# Patient Record
Sex: Male | Born: 1960
Health system: Southern US, Community
[De-identification: ages and names within clinical notes are randomized; demographics above are authoritative.]

## PROBLEM LIST (undated history)

## (undated) DIAGNOSIS — M1990 Unspecified osteoarthritis, unspecified site: Secondary | ICD-10-CM

## (undated) DIAGNOSIS — K219 Gastro-esophageal reflux disease without esophagitis: Secondary | ICD-10-CM

## (undated) DIAGNOSIS — I10 Essential (primary) hypertension: Secondary | ICD-10-CM

## (undated) DIAGNOSIS — Z973 Presence of spectacles and contact lenses: Secondary | ICD-10-CM

## (undated) DIAGNOSIS — Z860100 Personal history of colon polyps, unspecified: Secondary | ICD-10-CM

## (undated) DIAGNOSIS — Z87442 Personal history of urinary calculi: Secondary | ICD-10-CM

## (undated) DIAGNOSIS — M2390 Unspecified internal derangement of unspecified knee: Secondary | ICD-10-CM

## (undated) DIAGNOSIS — E559 Vitamin D deficiency, unspecified: Secondary | ICD-10-CM

## (undated) DIAGNOSIS — E785 Hyperlipidemia, unspecified: Secondary | ICD-10-CM

## (undated) DIAGNOSIS — K4090 Unilateral inguinal hernia, without obstruction or gangrene, not specified as recurrent: Secondary | ICD-10-CM

## (undated) DIAGNOSIS — N400 Enlarged prostate without lower urinary tract symptoms: Secondary | ICD-10-CM

## (undated) DIAGNOSIS — J45909 Unspecified asthma, uncomplicated: Secondary | ICD-10-CM

## (undated) DIAGNOSIS — Z8719 Personal history of other diseases of the digestive system: Secondary | ICD-10-CM

## (undated) HISTORY — PX: EYE SURGERY: SHX253

## (undated) HISTORY — DX: Vitamin D deficiency, unspecified: E55.9

## (undated) HISTORY — PX: OTHER SURGICAL HISTORY: SHX169

## (undated) HISTORY — PX: KNEE ARTHROSCOPY: SUR90

---

## 1987-02-04 HISTORY — PX: PARATHYROIDECTOMY: SHX19

## 1998-05-03 ENCOUNTER — Other Ambulatory Visit: Admission: RE | Admit: 1998-05-03 | Discharge: 1998-05-03 | Payer: Self-pay | Admitting: *Deleted

## 2001-12-17 ENCOUNTER — Encounter (INDEPENDENT_AMBULATORY_CARE_PROVIDER_SITE_OTHER): Payer: Self-pay | Admitting: Specialist

## 2001-12-17 ENCOUNTER — Ambulatory Visit (HOSPITAL_COMMUNITY): Admission: RE | Admit: 2001-12-17 | Discharge: 2001-12-17 | Payer: Self-pay | Admitting: Gastroenterology

## 2003-07-04 ENCOUNTER — Ambulatory Visit (HOSPITAL_COMMUNITY): Admission: RE | Admit: 2003-07-04 | Discharge: 2003-07-04 | Payer: Self-pay | Admitting: Pulmonary Disease

## 2006-01-29 ENCOUNTER — Ambulatory Visit: Payer: Self-pay | Admitting: Pulmonary Disease

## 2006-02-12 ENCOUNTER — Ambulatory Visit: Payer: Self-pay | Admitting: Pulmonary Disease

## 2006-02-12 LAB — CONVERTED CEMR LAB: Uric Acid, Serum: 7.5 mg/dL — ABNORMAL HIGH (ref 2.4–7.0)

## 2006-04-22 ENCOUNTER — Ambulatory Visit: Payer: Self-pay | Admitting: Pulmonary Disease

## 2006-08-06 ENCOUNTER — Ambulatory Visit (HOSPITAL_COMMUNITY): Admission: RE | Admit: 2006-08-06 | Discharge: 2006-08-06 | Payer: Self-pay | Admitting: Pulmonary Disease

## 2006-11-05 ENCOUNTER — Encounter: Admission: RE | Admit: 2006-11-05 | Discharge: 2006-11-05 | Payer: Self-pay | Admitting: Otolaryngology

## 2006-12-09 ENCOUNTER — Emergency Department (HOSPITAL_COMMUNITY): Admission: EM | Admit: 2006-12-09 | Discharge: 2006-12-09 | Payer: Self-pay | Admitting: Emergency Medicine

## 2007-01-05 ENCOUNTER — Ambulatory Visit: Payer: Self-pay | Admitting: Pulmonary Disease

## 2007-01-05 LAB — CONVERTED CEMR LAB
CRP, High Sensitivity: 3 (ref 0.00–5.00)
Cyclic Citrullin Peptide Ab: 1.5 U
IgA: 462 mg/dL — ABNORMAL HIGH
IgG (Immunoglobin G), Serum: 1080 mg/dL
IgM, Serum: 97 mg/dL
Rhuematoid fact SerPl-aCnc: <20 intl units/mL — ABNORMAL LOW (ref 0.0–20.0)
Total Protein, Serum Electrophoresis: 8.2 g/dL
Uric Acid, Serum: 6 mg/dL (ref 2.4–7.0)

## 2007-01-26 ENCOUNTER — Encounter: Payer: Self-pay | Admitting: Pulmonary Disease

## 2007-03-16 DIAGNOSIS — E7800 Pure hypercholesterolemia, unspecified: Secondary | ICD-10-CM

## 2007-03-16 DIAGNOSIS — R519 Headache, unspecified: Secondary | ICD-10-CM | POA: Insufficient documentation

## 2007-03-16 DIAGNOSIS — I10 Essential (primary) hypertension: Secondary | ICD-10-CM | POA: Insufficient documentation

## 2007-03-16 DIAGNOSIS — R51 Headache: Secondary | ICD-10-CM

## 2007-03-16 LAB — CONVERTED CEMR LAB: Metaneph Total, Ur: 673 ug/24hr

## 2007-04-02 ENCOUNTER — Observation Stay (HOSPITAL_COMMUNITY): Admission: RE | Admit: 2007-04-02 | Discharge: 2007-04-02 | Payer: Self-pay | Admitting: *Deleted

## 2007-04-02 HISTORY — PX: LUMBAR DISC SURGERY: SHX700

## 2007-06-09 ENCOUNTER — Ambulatory Visit: Payer: Self-pay | Admitting: Pulmonary Disease

## 2007-07-01 LAB — CONVERTED CEMR LAB
ALT: 45 units/L (ref 0–53)
AST: 30 units/L (ref 0–37)
Albumin: 4 g/dL (ref 3.5–5.2)
Alkaline Phosphatase: 57 units/L (ref 39–117)
Basophils Absolute: 0.1 10*3/uL (ref 0.0–0.1)
Bilirubin, Direct: 0.1 mg/dL (ref 0.0–0.3)
CO2: 28 meq/L (ref 19–32)
Calcium: 9.4 mg/dL (ref 8.4–10.5)
Chloride: 104 meq/L (ref 96–112)
Cholesterol: 211 mg/dL (ref 0–200)
GFR calc Af Amer: 103 mL/min
GFR calc non Af Amer: 85 mL/min
Glucose, Bld: 103 mg/dL — ABNORMAL HIGH (ref 70–99)
HCT: 42.4 % (ref 39.0–52.0)
Lymphocytes Relative: 40.2 % (ref 12.0–46.0)
MCHC: 33.7 g/dL (ref 30.0–36.0)
MCV: 88 fL (ref 78.0–100.0)
Monocytes Relative: 9.5 % (ref 3.0–12.0)
Platelets: 264 10*3/uL (ref 150–400)
TSH: 1.71 microintl units/mL (ref 0.35–5.50)
Triglycerides: 253 mg/dL (ref 0–149)
VLDL: 51 mg/dL — ABNORMAL HIGH (ref 0–40)

## 2007-09-13 ENCOUNTER — Ambulatory Visit: Payer: Self-pay | Admitting: Pulmonary Disease

## 2007-11-30 ENCOUNTER — Encounter: Payer: Self-pay | Admitting: Pulmonary Disease

## 2008-01-21 ENCOUNTER — Ambulatory Visit (HOSPITAL_COMMUNITY): Admission: RE | Admit: 2008-01-21 | Discharge: 2008-01-21 | Payer: Self-pay | Admitting: Gastroenterology

## 2008-01-21 ENCOUNTER — Encounter: Payer: Self-pay | Admitting: Pulmonary Disease

## 2008-01-21 HISTORY — PX: COLONOSCOPY W/ POLYPECTOMY: SHX1380

## 2008-04-04 ENCOUNTER — Encounter: Payer: Self-pay | Admitting: Pulmonary Disease

## 2008-04-10 ENCOUNTER — Encounter: Payer: Self-pay | Admitting: Pulmonary Disease

## 2008-04-27 ENCOUNTER — Encounter: Payer: Self-pay | Admitting: Pulmonary Disease

## 2008-11-14 ENCOUNTER — Encounter (INDEPENDENT_AMBULATORY_CARE_PROVIDER_SITE_OTHER): Payer: Self-pay | Admitting: Gastroenterology

## 2008-11-14 ENCOUNTER — Ambulatory Visit (HOSPITAL_COMMUNITY): Admission: RE | Admit: 2008-11-14 | Discharge: 2008-11-14 | Payer: Self-pay | Admitting: Gastroenterology

## 2008-11-14 ENCOUNTER — Encounter: Payer: Self-pay | Admitting: Pulmonary Disease

## 2008-11-15 HISTORY — PX: ESOPHAGOGASTRODUODENOSCOPY: SHX1529

## 2008-11-21 ENCOUNTER — Ambulatory Visit (HOSPITAL_COMMUNITY): Admission: RE | Admit: 2008-11-21 | Discharge: 2008-11-21 | Payer: Self-pay | Admitting: Gastroenterology

## 2009-10-04 ENCOUNTER — Encounter: Payer: Self-pay | Admitting: Pulmonary Disease

## 2010-05-14 ENCOUNTER — Encounter: Payer: Self-pay | Admitting: Pulmonary Disease

## 2010-06-18 NOTE — Consult Note (Signed)
NAMEANTHEM, FRAZER                ACCOUNT NO.:  [id number]   MEDICAL RECORD NO.:  [id number]          PATIENT TYPE:  EMS   LOCATION:  MAJO                         FACILITY:  [facility]   PHYSICIAN:  Pramod P. Pearlean Brownie, MD    DATE OF BIRTH:  May 19, 1960   DATE OF CONSULTATION:  DATE OF DISCHARGE:                                 CONSULTATION   REFERRING PHYSICIAN:  Carleene Cooper, MD.   REASON FOR REFERRAL:  Headache.   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 this morning with sudden onset of worse  headache of his life.  He stated the headache was maximum in the onset,  10/10 in severity, located in occipital region, accompanied by some  nausea and mild light sensitivity.  He initially thought that it would  go away and wanted to let it pass; however, his wife convinced him to  come to the emergency room.  He feels the headache has eased off a  little bit, but it is still moderate 4/10 in severity.  He denies any  loss of vision, double vision, vertigo, focal extremity weakness,  numbness, gait or balance problems.  The headache has persisted since he  woke up with it.  Patient denies significant posterior neck pain, muscle  spasm.  There is no history of recent trauma or any medication change.  He has not had any unusual physical exertion or injury or trauma in the  last few weeks.  He has a history of similar headache approximately 10  years ago which was similar in location.  He was seen by Dr. Avie Echevaria  at that time, and he underwent an MRI scan, MRA of the brain which were  both unremarkable.  He has no history of migraine headaches or tension  headaches.   PAST MEDICAL HISTORY:  Significant for hypertension, hyperlipidemia.   MEDICATIONS LIST:  Toprol XL 25 mg a day, Micardis hydrochlorothiazide  80/12.5 once a day, Lipitor 20 mg a day.   SOCIAL HISTORY:  Patient is married, lives with his wife in [place].  He is an  active physician on staff at [employer]   REVIEW OF SYSTEMS:  As stated above in H&P.   PHYSICAL EXAMINATION:  GENERAL:  Reveals a pleasant young Caucasian  male, not in distress.  VITAL SIGNS:  Afebrile; pulse rate 70 per minute, regular; respiratory  rate 16 per minute.  Blood pressure on admission was 180/102, now it has  come down to 138/92; pulse rate of 70 per minute, regular; respiratory  rate 18 per minute.  HEENT: __________, head is nontraumatic.  There is no neck stiffness or  muscle spasm.  There is no carotid bruit.  ENT exam unremarkable.  NEUROLOGIC EXAMINATION:  Patient is oriented, awake, alert, cooperative  with no aphasia or peripheral dysarthria and movements are full range  without nystagmus.  Funduscopic exam reveals sharp disk margins.  There  are no subhyaloid hemorrhages seen.  There is no papilledema.  Visual  acuity and fields are adequate.  Face  is symmetric.  Palatal movements  are normal.  Tongue is midline.  MOTOR SYSTEM:  Exam reveals no upper extremity drift; symmetric  strength, tone, reflexes coordination and sensation.  His gait is steady  including tandem walking.   DATA REVIEWED:  Noncontrast CAT scan of the head done today reveals no  acute abnormality.  There no subarachnoid hemorrhage noted.   MRI scan of the brain was subsequently obtained which also shows no  abnormalities.   The FLAIR images are negative for subarachnoid blood.   LABORATORY:  BMP is unremarkable.   IMPRESSION:  A 50 year old male with sudden onset of worst occipital  headache which has eased off but persists.  Certainly, there is strong  suspicion for subarachnoid hemorrhage and a small leaking aneurysm,  particularly given his family history of mother with aneurysms.   PLAN:  I would recommend further evaluation with doing a spinal tap  under fluoroscopy to look for xanthochromia or red cells.  If this is  positive, patient may need cerebral angiogram.  If  this is negative,  patient may be discharged home with nonsteroidal anti-inflammatory  medicines and follow up electively with Dr. Sandria Manly in the future.  I had a  long discussion with the patient and his wife and answered questions and  will be happy to follow him and give further advise as needed.           ______________________________  Sunny Schlein. Pearlean Brownie, MD     PPS/MEDQ  D:  12/09/2006  T:  12/10/2006  Job:  [id number]

## 2010-06-18 NOTE — Op Note (Signed)
[name], [name]                ACCOUNT NO.:  [id number]   MEDICAL RECORD NO.:  [id number]          PATIENT TYPE:  OBV   LOCATION:  3534                         FACILITY:  [facility]   PHYSICIAN:  Stefani Dama, M.D.  DATE OF BIRTH:  1960-06-08   DATE OF PROCEDURE:  04/02/2007  DATE OF DISCHARGE:  04/02/2007                               OPERATIVE REPORT   PREOPERATIVE DIAGNOSIS:  Lumbar herniated nucleus pulposus, L5-S1,  right, with right lumbar radiculopathy.   POSTOPERATIVE DIAGNOSIS:  Lumbar herniated nucleus pulposus, L5-S1,  right, with right lumbar radiculopathy.   PROCEDURES:  METRx diskectomy, L5-S1, right, with operating microscope  and microdissection technique.   SURGEON:  Stefani Dama, M.D.   FIRST ASSISTANT:  Danae Orleans. Venetia Maxon, M.D.   ANESTHESIA:  General endotracheal.   INDICATIONS:  [name] is a 50 year old individual who has had  significant back and right lower extremity pain.  He has evidence of a  herniated nucleus pulposus and has failed efforts at conservative  management; we advised a microdiskectomy.   PROCEDURE:  The patient was brought to the operating room supine on a  stretcher.  After the smooth induction of general endotracheal  anesthesia, he was turned prone.  Back was prepped with alcohol and  DuraPrep and draped in a sterile fashion.  Fluoroscopic guidance was  used to localize the L5-S1 space and then a paramedian incision was  created and a K-wire was passed into the interlaminar space at L5-S1.  A  series of dilators were then passed over the K-wire and using a wanding  technique, the fascia overlying L5-S1 was dissected. A 6-cm-deep x 18-mm-  wide cannula was affixed to the operating table with a clamp and the  operating microscope was brought in over this field and then a  laminotomy was created, removing the inferior margin of the lamina of L5  out to the mesial wall of the facet.  Soft tissues in this area were  cleared and the  yellow ligament was taken up. Underneath the yellow  ligament, the common dural tube was identified and the laminotomy was  carried out laterally and a mesial facetectomy was performed, removing  just the inner quarter of the facet joint at L5-S1; this exposed the S1  nerve root, which was noted be tented dorsally and was rather blanched.  By gently dissecting just lateral to the S1 nerve root, a mass was  uncovered; this was noted be a pearlescent mass of the posterior  longitudinal ligament which was under considerable tension.  The mass  was incised in a vertical fashion and a fragment of degenerated disk  extruded itself.  This was removed and several other small fragments  were found in this region.  The area was then palpated with a nerve hook  and an opening into the disk space could not be identified.  The disk  space itself appeared to have the ligament intact and because of this,  it was felt that an opening into the disk space would not be  appropriate.  Hemostasis in the soft tissues was done  obtained and the  area was inspected again.  When no fragments of disk were identified,  the endoscopic cannula was then removed after instilling 0.5 mL of  fentanyl in the epidural space.  The  lumbodorsal fascia was then closed with #1 Vicryl in interrupted  fashion.  Ten milliliters of 0.5% Marcaine were injected into the  epidural space and the subcutaneous tissue was closed with 3-0 Vicryl in  an inverted, interrupted fashion.  The patient tolerated the procedure  well.  Blood loss was estimated at less than 20 mL.      Stefani Dama, M.D.  Electronically Signed     HJE/MEDQ  D:  04/02/2007  T:  04/03/2007  Job:  [id number]

## 2010-06-18 NOTE — Op Note (Signed)
[name], [name]                ACCOUNT NO.:  [id number]   MEDICAL RECORD NO.:  [id number]          PATIENT TYPE:  AMB   LOCATION:  ENDO                         FACILITY:  [facility]   PHYSICIAN:  Petra Kuba, M.D.    DATE OF BIRTH:  1960-09-19   DATE OF PROCEDURE:  01/21/2008  DATE OF DISCHARGE:                               OPERATIVE REPORT   PROCEDURE:  Colonoscopy.   INDICATION:  Patient with known history of colon polyps, family history  of colon cancer, due for colonic screening.  Consent was signed after  risks, benefits, methods, options thoroughly discussed multiple times in  the past.   MEDICINES USED:  1. Fentanyl 75 mcg.  2. Versed 8 mg.   PROCEDURE:  Rectal inspection is pertinent for small internal  hemorrhoids.  Digital exam was negative.  The video pediatric  colonoscope was inserted, easily advanced around the colon to the cecum.  Other than some rare left-sided small diverticula, no abnormalities were  seen on insertion.  The cecum was identified by the appendiceal orifice  and the ileocecal valve.  In fact the scope inserted a short ways in the  terminal ileum, which was normal.  Photo documentation was obtained.  The scope was slowly withdrawn.  The prep was adequate.  There was  minimal liquid stool that required washing and suctioning on slow  withdrawal through the colon.  The cecum, ascending, tranverse, and  descending were all normal.  The rare sigmoid diverticula was confirmed.  No polypoid lesions, masses, or other abnormalities were seen as we  slowly withdrew back to the rectum.  Once back in the rectum, anorectal  pull-through and retroflexion confirmed some small hemorrhoids.  The  scope was straightened, readvanced a short ways up the left side of the  colon.  Air was suctioned, scope removed.   The patient tolerated the procedure well.  There was no obvious  immediate complication.   ENDOSCOPIC DIAGNOSES:  1. Internal and external small  hemorrhoids.  2. Rare left-sided diverticula.  3. Otherwise within normal limits to the terminal ileum.   PLAN:  I would be happy to see him back p.r.n. to let me know if reflux  symptoms increase, and otherwise recheck colon screen in 5 years.           ______________________________  Petra Kuba, M.D.     MEM/MEDQ  D:  01/21/2008  T:  01/21/2008  Job:  [id number]   cc:   Lonzo Cloud. Kriste Basque, MD  [address]  [place]  [place] [address]

## 2010-06-21 NOTE — Op Note (Signed)
NAME:  [name], [name]                          ACCOUNT NO.:  [id number]   MEDICAL RECORD NO.:  [id number]                   PATIENT TYPE:  AMB   LOCATION:  ENDO                                 FACILITY:  [facility]   PHYSICIAN:  Petra Kuba, M.D.                 DATE OF BIRTH:  [birth year]   DATE OF PROCEDURE:  12/17/2001  DATE OF DISCHARGE:                                 OPERATIVE REPORT   PROCEDURE:  Colonoscopy with polypectomy.   INDICATIONS FOR PROCEDURE:  A patient with a history of colon polyps here  for repeat screening.  Consent was signed after risks, benefits, methods,  and options were thoroughly discussed in the past.   MEDICINES USED:  Demerol 70, Versed 7.5.   DESCRIPTION OF PROCEDURE:  Rectal inspection was pertinent for tiny external  hemorrhoids. Digital exam was negative. The pediatric video adjustable  colonoscope was inserted, easily advanced around the colon to the cecum.  This did not require any abdominal pressure or any position changes. No  obvious abnormality was seen on insertion. The cecum was identified by the  appendiceal orifice and the ileocecal valve. In fact, the scope was inserted  a very short ways into the terminal ileum which was normal. Photo  documentation was obtained. The scope was slowly withdrawn. The prep was  adequate. There was some liquid stool that required suctioning only on slow  withdrawal through the colon. The cecum and ascending were normal. In the  mid transverse, a tiny questionable polyp was seen and was hot biopsied x1.  The scope was further withdrawn. On slow withdrawal back to the rectum, no  additional polyps, masses, diverticula or other abnormalities were seen as  we slowly withdrew back to the rectum. Anorectal pull-through and  retroflexed visualization of the rectum confirmed some tiny hemorrhoids and  confirmed a small anal papillae without worrisome stigmata.  The scope was  straightened and readvanced to the mid  transverse and polypectomy site was  seen  with a nice white coagulum without any obvious residual polypoid  tissue. Air was suctioned, scope was slowly withdrawn. The patient tolerated  the procedure well and there was no obvious or immediate complications.   ENDOSCOPIC DIAGNOSIS:  1. Internal and external hemorrhoids with a small anal papillae.  2. Tiny transverse questionable polyp hot biopsied.  3. Otherwise within normal limits to the cecum and the terminal ileum.    PLAN:  Await pathology but probably recheck colon screening in five years.  Happy to see back p.r.n.; otherwise, return care to Dr. Evlyn Kanner for the  customary health care maintenance to include yearly rectals and guaiacs.  Petra Kuba, M.D.    MEM/MEDQ  D:  12/17/2001  T:  12/17/2001  Job:  [id number]   cc:   Marcelyn Bruins, M.D. [facility]   Jeannett Senior A. Evlyn Kanner, M.D.  [address]  [place]  [place] [address]  Fax: [phone]

## 2010-06-21 NOTE — Letter (Signed)
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  He had a routine physical examination last  year and is under my care for the treatment of hypertension and  hyperlipidemia.   As part of his initial history, Kipper indicated to me that he had a  severe reaction to a flu shot in 1986, with swelling, redness and  temperature to 102 degrees with chills.  This was followed by a 5-day  illness and eventual recovery.  Of note, a coworker who received the  same vaccine came down with Guillain-Barre' syndrome.  Ever since that  time, Darcell has declined flu vaccines because of his previous allergic  reaction.   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  Because of the severity of his previous reaction, I feel that it is best  to exempt him from the flu shot requirement.  We discussed the  importance of protecting himself, as well as patients and coworkers in  the event of an influenza like illness, and he has been advised to stay  home for 1-week should he develop an influenza like illness  with fever, etc.  He has assured me that he would not put any of the  patients or medical staff at risk.   Thank you very much for your assistance in this matter, if I can be of  further help please let me know.    Sincerely,      Lonzo Cloud. Kriste Basque, MD  Electronically Signed    SMN/MedQ  DD: 11/30/2007  DT: 11/30/2007  Job #: [id number]

## 2010-09-29 ENCOUNTER — Inpatient Hospital Stay (INDEPENDENT_AMBULATORY_CARE_PROVIDER_SITE_OTHER)
Admission: RE | Admit: 2010-09-29 | Discharge: 2010-09-29 | Disposition: A | Discharge status: Home / Self Care | Payer: 59 | Source: Ambulatory Visit | Attending: Family Medicine | Admitting: Family Medicine

## 2010-09-29 ENCOUNTER — Ambulatory Visit (INDEPENDENT_AMBULATORY_CARE_PROVIDER_SITE_OTHER): Payer: 59

## 2010-09-29 DIAGNOSIS — S63509A Unspecified sprain of unspecified wrist, initial encounter: Secondary | ICD-10-CM

## 2010-09-29 DIAGNOSIS — S52123A Displaced fracture of head of unspecified radius, initial encounter for closed fracture: Secondary | ICD-10-CM

## 2010-10-03 ENCOUNTER — Other Ambulatory Visit: Payer: Self-pay | Admitting: Pulmonary Disease

## 2010-10-03 ENCOUNTER — Ambulatory Visit (INDEPENDENT_AMBULATORY_CARE_PROVIDER_SITE_OTHER)
Admission: RE | Admit: 2010-10-03 | Discharge: 2010-10-03 | Disposition: A | Discharge status: Home / Self Care | Payer: 59 | Source: Ambulatory Visit | Attending: Pulmonary Disease | Admitting: Pulmonary Disease

## 2010-10-03 DIAGNOSIS — R0600 Dyspnea, unspecified: Secondary | ICD-10-CM

## 2010-10-03 DIAGNOSIS — R0609 Other forms of dyspnea: Secondary | ICD-10-CM

## 2010-10-25 LAB — COMPREHENSIVE METABOLIC PANEL WITH GFR
ALT: 35
AST: 32
Albumin: 4.2
Alkaline Phosphatase: 55
BUN: 16
CO2: 31
Calcium: 9.8
Chloride: 96
Creatinine, Ser: 1.08
GFR calc non Af Amer: >60
Glucose, Bld: 113 — ABNORMAL HIGH
Potassium: 4.2
Sodium: 135
Total Bilirubin: 1.1
Total Protein: 7.2

## 2010-10-25 LAB — CBC
Hemoglobin: 15.6
MCHC: 34.6
MCV: 86.4

## 2010-11-12 LAB — PROTEIN AND GLUCOSE, CSF: Glucose, CSF: 58

## 2010-11-12 LAB — GRAM STAIN

## 2010-11-12 LAB — I-STAT 8, (EC8 V) (CONVERTED LAB)
HCT: 42
Hemoglobin: 14.3
Operator id: 285841
Potassium: 4.1
Sodium: 139
TCO2: 25

## 2010-11-12 LAB — CSF CELL COUNT WITH DIFFERENTIAL
Eosinophils, CSF: 0
Other Cells, CSF: 0
RBC Count, CSF: 4 — ABNORMAL HIGH
Tube #: 1
WBC, CSF: 1

## 2010-11-12 LAB — CSF CULTURE W GRAM STAIN

## 2010-11-12 LAB — COMPREHENSIVE METABOLIC PANEL
ALT: 33
AST: 27
Albumin: 3.9
Calcium: 9.9
Chloride: 106
Creatinine, Ser: 1.06
GFR calc Af Amer: >60
Sodium: 140

## 2010-11-12 LAB — PROTEIN ELECTROPHORESIS, SERUM
Beta 2: 6.2
Gamma Globulin: 12.3
M-Spike, %: NOT DETECTED

## 2010-11-12 LAB — HEMOGLOBIN A1C: Mean Plasma Glucose: 122

## 2010-11-12 LAB — CBC
MCV: 87.9
Platelets: 289
RBC: 4.53
WBC: 6.7

## 2010-11-12 LAB — URINALYSIS, ROUTINE W REFLEX MICROSCOPIC
Bilirubin Urine: NEGATIVE
Hgb urine dipstick: NEGATIVE

## 2010-11-12 LAB — T4, FREE: Free T4: 1.44

## 2010-11-12 LAB — TSH: TSH: 2.734

## 2010-11-12 LAB — T3, FREE: T3, Free: 4 (ref 2.3–4.2)

## 2010-11-12 LAB — DIFFERENTIAL
Eosinophils Absolute: 0.3
Eosinophils Relative: 5
Lymphocytes Relative: 31
Lymphs Abs: 2.1
Monocytes Absolute: 0.5

## 2010-11-12 LAB — POCT I-STAT CREATININE
Creatinine, Ser: 1
Operator id: 285841

## 2011-01-06 ENCOUNTER — Ambulatory Visit: Payer: 59 | Admitting: Pulmonary Disease

## 2011-01-21 ENCOUNTER — Telehealth: Payer: Self-pay | Admitting: Pulmonary Disease

## 2011-01-21 NOTE — Telephone Encounter (Signed)
Error.  [name] ° °

## 2011-05-26 ENCOUNTER — Other Ambulatory Visit: Payer: Self-pay

## 2011-05-26 MED ORDER — ESOMEPRAZOLE MAGNESIUM 40 MG PO CPDR: 40.0000 mg | DELAYED_RELEASE_CAPSULE | Freq: Every day | ORAL | Status: DC

## 2011-08-22 ENCOUNTER — Other Ambulatory Visit: Payer: Self-pay | Admitting: Pulmonary Disease

## 2011-08-22 MED ORDER — ATORVASTATIN CALCIUM 40 MG PO TABS: 40.0000 mg | ORAL_TABLET | Freq: Every day | ORAL | Status: DC

## 2012-04-05 ENCOUNTER — Other Ambulatory Visit: Payer: Self-pay | Admitting: Pulmonary Disease

## 2012-04-05 MED ORDER — PANTOPRAZOLE SODIUM 40 MG PO TBEC: 40.0000 mg | DELAYED_RELEASE_TABLET | Freq: Every day | ORAL | Status: DC

## 2012-06-02 ENCOUNTER — Other Ambulatory Visit (HOSPITAL_COMMUNITY): Payer: Self-pay | Admitting: Neurological Surgery

## 2012-06-02 DIAGNOSIS — M79604 Pain in right leg: Secondary | ICD-10-CM

## 2012-06-02 DIAGNOSIS — M7918 Myalgia, other site: Secondary | ICD-10-CM

## 2012-06-02 DIAGNOSIS — M545 Low back pain, unspecified: Secondary | ICD-10-CM

## 2012-06-03 ENCOUNTER — Ambulatory Visit (HOSPITAL_COMMUNITY): Payer: 59

## 2012-06-10 ENCOUNTER — Ambulatory Visit (HOSPITAL_COMMUNITY)
Admission: RE | Admit: 2012-06-10 | Discharge: 2012-06-10 | Disposition: A | Discharge status: Home / Self Care | Payer: 59 | Source: Ambulatory Visit | Attending: Neurological Surgery | Admitting: Neurological Surgery

## 2012-06-10 DIAGNOSIS — M5380 Other specified dorsopathies, site unspecified: Secondary | ICD-10-CM | POA: Insufficient documentation

## 2012-06-10 DIAGNOSIS — M51379 Other intervertebral disc degeneration, lumbosacral region without mention of lumbar back pain or lower extremity pain: Secondary | ICD-10-CM | POA: Insufficient documentation

## 2012-06-10 DIAGNOSIS — M5137 Other intervertebral disc degeneration, lumbosacral region: Secondary | ICD-10-CM | POA: Insufficient documentation

## 2012-06-10 DIAGNOSIS — IMO0002 Reserved for concepts with insufficient information to code with codable children: Secondary | ICD-10-CM | POA: Insufficient documentation

## 2012-06-10 DIAGNOSIS — M5126 Other intervertebral disc displacement, lumbar region: Secondary | ICD-10-CM | POA: Insufficient documentation

## 2012-06-10 DIAGNOSIS — M545 Low back pain, unspecified: Secondary | ICD-10-CM | POA: Insufficient documentation

## 2012-06-10 DIAGNOSIS — M79604 Pain in right leg: Secondary | ICD-10-CM

## 2012-06-10 DIAGNOSIS — IMO0001 Reserved for inherently not codable concepts without codable children: Secondary | ICD-10-CM | POA: Insufficient documentation

## 2012-06-10 DIAGNOSIS — M48061 Spinal stenosis, lumbar region without neurogenic claudication: Secondary | ICD-10-CM | POA: Insufficient documentation

## 2012-06-10 DIAGNOSIS — M7918 Myalgia, other site: Secondary | ICD-10-CM

## 2012-06-10 DIAGNOSIS — G9589 Other specified diseases of spinal cord: Secondary | ICD-10-CM | POA: Insufficient documentation

## 2012-06-10 MED ORDER — GADOBENATE DIMEGLUMINE 529 MG/ML IV SOLN
20.0000 mL | Freq: Once | INTRAVENOUS | Status: AC
  Administered 2012-06-10: 19 mL via INTRAVENOUS

## 2012-07-15 ENCOUNTER — Encounter: Payer: Self-pay | Admitting: Pulmonary Disease

## 2012-07-15 ENCOUNTER — Ambulatory Visit (INDEPENDENT_AMBULATORY_CARE_PROVIDER_SITE_OTHER): Payer: 59 | Admitting: Pulmonary Disease

## 2012-07-15 VITALS — BP 120/84 | HR 62 | Temp 98.3°F | Ht 74.0 in | Wt 192.0 lb

## 2012-07-15 DIAGNOSIS — I10 Essential (primary) hypertension: Secondary | ICD-10-CM

## 2012-07-15 DIAGNOSIS — E785 Hyperlipidemia, unspecified: Secondary | ICD-10-CM

## 2012-07-15 DIAGNOSIS — K449 Diaphragmatic hernia without obstruction or gangrene: Secondary | ICD-10-CM

## 2012-07-15 DIAGNOSIS — J45901 Unspecified asthma with (acute) exacerbation: Secondary | ICD-10-CM

## 2012-07-15 DIAGNOSIS — Z0000 Encounter for general adult medical examination without abnormal findings: Secondary | ICD-10-CM

## 2012-07-15 DIAGNOSIS — J683 Other acute and subacute respiratory conditions due to chemicals, gases, fumes and vapors: Secondary | ICD-10-CM

## 2012-07-15 MED ORDER — PREDNISONE 20 MG PO TABS: ORAL_TABLET | ORAL | Status: DC

## 2012-07-15 MED ORDER — AMOXICILLIN-POT CLAVULANATE 875-125 MG PO TABS: 1.0000 | ORAL_TABLET | Freq: Two times a day (BID) | ORAL | Status: DC

## 2012-07-17 ENCOUNTER — Encounter: Payer: Self-pay | Admitting: Pulmonary Disease

## 2012-07-17 DIAGNOSIS — Z0000 Encounter for general adult medical examination without abnormal findings: Secondary | ICD-10-CM | POA: Insufficient documentation
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
## 2012-08-03 LAB — PULMONARY FUNCTION TEST

## 2012-08-20 ENCOUNTER — Encounter: Payer: Self-pay | Admitting: Pulmonary Disease

## 2012-12-16 ENCOUNTER — Other Ambulatory Visit: Payer: Self-pay | Admitting: Pulmonary Disease

## 2013-03-14 ENCOUNTER — Telehealth: Payer: Self-pay | Admitting: Pulmonary Disease

## 2013-03-14 NOTE — Telephone Encounter (Signed)
Talked w/ Lanny Hurst regarding persistent cough, congestion, sm amt ?green sput; min dyspnea, no f/c/s, no sinus symptoms/ drainage/ reflux, etc... He's been on Dulera100 + OTC meds as needed thru the summer & fall and did well on trip to climb the [place]... Then had upper resp infection over Thanksgiving w/ resp exac requiring a course of Pred & he notes his symptoms resolved w/ this Rx... But the [facility] has not decr the freq or prevented the URIs which occured again in Jan & recently=> may want to stop the ICS and for now treat the refractory AB w/ ZPak & Pred taper (called to [place])...  SMN

## 2013-07-22 ENCOUNTER — Other Ambulatory Visit: Payer: Self-pay | Admitting: Pulmonary Disease

## 2013-07-22 MED ORDER — PANTOPRAZOLE SODIUM 40 MG PO TBEC: 40.0000 mg | DELAYED_RELEASE_TABLET | Freq: Every day | ORAL | Status: DC

## 2013-08-10 ENCOUNTER — Other Ambulatory Visit (HOSPITAL_COMMUNITY): Payer: Self-pay | Admitting: Specialist

## 2013-08-10 DIAGNOSIS — M25561 Pain in right knee: Secondary | ICD-10-CM

## 2013-08-15 ENCOUNTER — Ambulatory Visit (HOSPITAL_COMMUNITY): Admission: RE | Admit: 2013-08-15 | Payer: 59 | Source: Ambulatory Visit

## 2013-08-17 ENCOUNTER — Ambulatory Visit (HOSPITAL_COMMUNITY)
Admission: RE | Admit: 2013-08-17 | Discharge: 2013-08-17 | Disposition: A | Discharge status: Home / Self Care | Payer: 59 | Source: Ambulatory Visit | Attending: Specialist | Admitting: Specialist

## 2013-08-17 DIAGNOSIS — M25569 Pain in unspecified knee: Secondary | ICD-10-CM | POA: Insufficient documentation

## 2013-08-17 DIAGNOSIS — M259 Joint disorder, unspecified: Secondary | ICD-10-CM | POA: Insufficient documentation

## 2013-08-17 DIAGNOSIS — M25561 Pain in right knee: Secondary | ICD-10-CM

## 2013-08-17 DIAGNOSIS — M25469 Effusion, unspecified knee: Secondary | ICD-10-CM | POA: Insufficient documentation

## 2013-08-17 DIAGNOSIS — M898X9 Other specified disorders of bone, unspecified site: Secondary | ICD-10-CM | POA: Insufficient documentation

## 2013-08-17 DIAGNOSIS — M2390 Unspecified internal derangement of unspecified knee: Secondary | ICD-10-CM | POA: Insufficient documentation

## 2013-08-17 DIAGNOSIS — M7120 Synovial cyst of popliteal space [Baker], unspecified knee: Secondary | ICD-10-CM | POA: Insufficient documentation

## 2013-10-05 ENCOUNTER — Other Ambulatory Visit: Payer: Self-pay | Admitting: Pulmonary Disease

## 2013-10-05 MED ORDER — OLMESARTAN MEDOXOMIL-HCTZ 40-12.5 MG PO TABS: 1.0000 | ORAL_TABLET | Freq: Every day | ORAL | Status: DC

## 2013-11-30 ENCOUNTER — Encounter (HOSPITAL_BASED_OUTPATIENT_CLINIC_OR_DEPARTMENT_OTHER): Payer: Self-pay | Admitting: *Deleted

## 2013-12-02 ENCOUNTER — Encounter (HOSPITAL_BASED_OUTPATIENT_CLINIC_OR_DEPARTMENT_OTHER): Payer: Self-pay | Admitting: *Deleted

## 2013-12-02 NOTE — Progress Notes (Signed)
NPO AFTER MN WITH EXCEPTION CLEAR LIQUIDS UNTIL 0800 (NO CREAM/ MILK PRODUCTS).  ARRIVE AT 1215. NEEDS ISTAT AND EKG.  WILL TAKE AM MEDS W/ SIPS OF WATER , WILL TAKE REGULAR BENICAR WITHOUT THE HCTZ.

## 2013-12-05 ENCOUNTER — Other Ambulatory Visit: Payer: Self-pay | Admitting: Orthopedic Surgery

## 2013-12-06 ENCOUNTER — Ambulatory Visit (HOSPITAL_BASED_OUTPATIENT_CLINIC_OR_DEPARTMENT_OTHER): Payer: 59 | Admitting: Anesthesiology

## 2013-12-06 ENCOUNTER — Encounter (HOSPITAL_BASED_OUTPATIENT_CLINIC_OR_DEPARTMENT_OTHER)
Admission: RE | Disposition: A | Discharge status: Home / Self Care | Payer: Self-pay | Source: Ambulatory Visit | Attending: Specialist

## 2013-12-06 ENCOUNTER — Ambulatory Visit (HOSPITAL_BASED_OUTPATIENT_CLINIC_OR_DEPARTMENT_OTHER)
Admission: RE | Admit: 2013-12-06 | Discharge: 2013-12-06 | Disposition: A | Discharge status: Home / Self Care | Payer: 59 | Source: Ambulatory Visit | Attending: Specialist | Admitting: Specialist

## 2013-12-06 ENCOUNTER — Encounter (HOSPITAL_BASED_OUTPATIENT_CLINIC_OR_DEPARTMENT_OTHER): Payer: Self-pay | Admitting: *Deleted

## 2013-12-06 DIAGNOSIS — J45909 Unspecified asthma, uncomplicated: Secondary | ICD-10-CM | POA: Insufficient documentation

## 2013-12-06 DIAGNOSIS — S83211A Bucket-handle tear of medial meniscus, current injury, right knee, initial encounter: Secondary | ICD-10-CM | POA: Diagnosis not present

## 2013-12-06 DIAGNOSIS — S83281A Other tear of lateral meniscus, current injury, right knee, initial encounter: Secondary | ICD-10-CM | POA: Insufficient documentation

## 2013-12-06 DIAGNOSIS — Y929 Unspecified place or not applicable: Secondary | ICD-10-CM | POA: Insufficient documentation

## 2013-12-06 DIAGNOSIS — X58XXXA Exposure to other specified factors, initial encounter: Secondary | ICD-10-CM | POA: Diagnosis not present

## 2013-12-06 DIAGNOSIS — Z888 Allergy status to other drugs, medicaments and biological substances status: Secondary | ICD-10-CM | POA: Insufficient documentation

## 2013-12-06 DIAGNOSIS — K219 Gastro-esophageal reflux disease without esophagitis: Secondary | ICD-10-CM | POA: Insufficient documentation

## 2013-12-06 DIAGNOSIS — Z79899 Other long term (current) drug therapy: Secondary | ICD-10-CM | POA: Diagnosis not present

## 2013-12-06 DIAGNOSIS — N400 Enlarged prostate without lower urinary tract symptoms: Secondary | ICD-10-CM | POA: Diagnosis not present

## 2013-12-06 DIAGNOSIS — I10 Essential (primary) hypertension: Secondary | ICD-10-CM | POA: Diagnosis not present

## 2013-12-06 DIAGNOSIS — M94261 Chondromalacia, right knee: Secondary | ICD-10-CM | POA: Insufficient documentation

## 2013-12-06 DIAGNOSIS — M179 Osteoarthritis of knee, unspecified: Secondary | ICD-10-CM | POA: Diagnosis not present

## 2013-12-06 DIAGNOSIS — K449 Diaphragmatic hernia without obstruction or gangrene: Secondary | ICD-10-CM | POA: Diagnosis not present

## 2013-12-06 DIAGNOSIS — E785 Hyperlipidemia, unspecified: Secondary | ICD-10-CM | POA: Diagnosis not present

## 2013-12-06 DIAGNOSIS — Z86010 Personal history of colonic polyps: Secondary | ICD-10-CM | POA: Insufficient documentation

## 2013-12-06 HISTORY — DX: Personal history of colon polyps, unspecified: Z86.0100

## 2013-12-06 HISTORY — DX: Essential (primary) hypertension: I10

## 2013-12-06 HISTORY — DX: Unspecified asthma, uncomplicated: J45.909

## 2013-12-06 HISTORY — PX: KNEE ARTHROSCOPY: SHX127

## 2013-12-06 HISTORY — DX: Benign prostatic hyperplasia without lower urinary tract symptoms: N40.0

## 2013-12-06 HISTORY — DX: Unspecified internal derangement of unspecified knee: M23.90

## 2013-12-06 HISTORY — DX: Presence of spectacles and contact lenses: Z97.3

## 2013-12-06 HISTORY — DX: Personal history of other diseases of the digestive system: Z87.19

## 2013-12-06 HISTORY — DX: Personal history of colonic polyps: Z86.010

## 2013-12-06 HISTORY — DX: Gastro-esophageal reflux disease without esophagitis: K21.9

## 2013-12-06 HISTORY — DX: Hyperlipidemia, unspecified: E78.5

## 2013-12-06 SURGERY — ARTHROSCOPY, KNEE
Anesthesia: General | Site: Knee | Laterality: Right

## 2013-12-06 MED ORDER — HYDROCODONE-ACETAMINOPHEN 5-325 MG PO TABS
ORAL_TABLET | ORAL | Status: AC
  Filled 2013-12-06: qty 1

## 2013-12-06 MED ORDER — FENTANYL CITRATE 0.05 MG/ML IJ SOLN
INTRAMUSCULAR | Status: DC | PRN
  Administered 2013-12-06 (×4): 25 ug via INTRAVENOUS
  Administered 2013-12-06: 50 ug via INTRAVENOUS

## 2013-12-06 MED ORDER — HYDROCODONE-ACETAMINOPHEN 5-325 MG PO TABS
1.0000 | ORAL_TABLET | ORAL | Status: DC | PRN
  Administered 2013-12-06: 1 via ORAL
  Filled 2013-12-06: qty 2

## 2013-12-06 MED ORDER — HYDROCODONE-ACETAMINOPHEN 5-325 MG PO TABS: 1.0000 | ORAL_TABLET | ORAL | Status: DC | PRN

## 2013-12-06 MED ORDER — FENTANYL CITRATE 0.05 MG/ML IJ SOLN
25.0000 ug | INTRAMUSCULAR | Status: DC | PRN
  Administered 2013-12-06 (×2): 25 ug via INTRAVENOUS
  Filled 2013-12-06: qty 1

## 2013-12-06 MED ORDER — SODIUM CHLORIDE 0.9 % IR SOLN
Status: DC | PRN
  Administered 2013-12-06: 6000 mL
  Administered 2013-12-06: 3000 mL

## 2013-12-06 MED ORDER — METHOCARBAMOL 500 MG PO TABS
500.0000 mg | ORAL_TABLET | Freq: Three times a day (TID) | ORAL | Status: DC | PRN
  Administered 2013-12-06: 500 mg via ORAL
  Filled 2013-12-06: qty 1

## 2013-12-06 MED ORDER — MIDAZOLAM HCL 2 MG/2ML IJ SOLN
INTRAMUSCULAR | Status: AC
  Filled 2013-12-06: qty 2

## 2013-12-06 MED ORDER — FENTANYL CITRATE 0.05 MG/ML IJ SOLN
INTRAMUSCULAR | Status: AC
  Filled 2013-12-06: qty 2

## 2013-12-06 MED ORDER — LACTATED RINGERS IV SOLN
INTRAVENOUS | Status: DC
  Filled 2013-12-06: qty 1000

## 2013-12-06 MED ORDER — CEPHALEXIN 500 MG PO CAPS: 500.0000 mg | ORAL_CAPSULE | Freq: Three times a day (TID) | ORAL | Status: DC

## 2013-12-06 MED ORDER — MORPHINE SULFATE 4 MG/ML IJ SOLN
INTRAMUSCULAR | Status: AC
  Filled 2013-12-06: qty 1

## 2013-12-06 MED ORDER — MIDAZOLAM HCL 5 MG/5ML IJ SOLN
INTRAMUSCULAR | Status: DC | PRN
  Administered 2013-12-06: 2 mg via INTRAVENOUS

## 2013-12-06 MED ORDER — CEFAZOLIN SODIUM-DEXTROSE 2-3 GM-% IV SOLR
INTRAVENOUS | Status: AC
  Filled 2013-12-06: qty 50

## 2013-12-06 MED ORDER — DEXAMETHASONE SODIUM PHOSPHATE 10 MG/ML IJ SOLN
INTRAMUSCULAR | Status: DC | PRN
  Administered 2013-12-06: 8 mg via INTRAVENOUS

## 2013-12-06 MED ORDER — METHOCARBAMOL 500 MG PO TABS: 500.0000 mg | ORAL_TABLET | Freq: Three times a day (TID) | ORAL | Status: DC | PRN

## 2013-12-06 MED ORDER — ONDANSETRON HCL 4 MG PO TABS: 4.0000 mg | ORAL_TABLET | Freq: Three times a day (TID) | ORAL | Status: DC | PRN

## 2013-12-06 MED ORDER — POVIDONE-IODINE 7.5 % EX SOLN
Freq: Once | CUTANEOUS | Status: DC
  Filled 2013-12-06: qty 118

## 2013-12-06 MED ORDER — ASPIRIN EC 325 MG PO TBEC: 325.0000 mg | DELAYED_RELEASE_TABLET | Freq: Two times a day (BID) | ORAL | Status: DC

## 2013-12-06 MED ORDER — PROPOFOL 10 MG/ML IV BOLUS
INTRAVENOUS | Status: DC | PRN
  Administered 2013-12-06: 200 mg via INTRAVENOUS

## 2013-12-06 MED ORDER — LACTATED RINGERS IV SOLN
INTRAVENOUS | Status: DC
  Administered 2013-12-06 (×2): via INTRAVENOUS
  Filled 2013-12-06: qty 1000

## 2013-12-06 MED ORDER — LIDOCAINE HCL (CARDIAC) 20 MG/ML IV SOLN
INTRAVENOUS | Status: DC | PRN
  Administered 2013-12-06: 60 mg via INTRAVENOUS

## 2013-12-06 MED ORDER — CEFAZOLIN SODIUM-DEXTROSE 2-3 GM-% IV SOLR
2.0000 g | INTRAVENOUS | Status: AC
  Administered 2013-12-06: 2 g via INTRAVENOUS
  Filled 2013-12-06: qty 50

## 2013-12-06 MED ORDER — ONDANSETRON HCL 4 MG/2ML IJ SOLN
INTRAMUSCULAR | Status: DC | PRN
  Administered 2013-12-06: 4 mg via INTRAVENOUS

## 2013-12-06 MED ORDER — METHOCARBAMOL 500 MG PO TABS
ORAL_TABLET | ORAL | Status: AC
  Filled 2013-12-06: qty 1

## 2013-12-06 MED ORDER — MORPHINE SULFATE 4 MG/ML IJ SOLN
INTRAMUSCULAR | Status: DC | PRN
  Administered 2013-12-06: 4 mg

## 2013-12-06 MED ORDER — BUPIVACAINE HCL 0.25 % IJ SOLN
INTRAMUSCULAR | Status: DC | PRN
  Administered 2013-12-06: 30 mL

## 2013-12-06 MED ORDER — FENTANYL CITRATE 0.05 MG/ML IJ SOLN
INTRAMUSCULAR | Status: AC
  Filled 2013-12-06: qty 4

## 2013-12-06 SURGICAL SUPPLY — 55 items
BANDAGE ESMARK 6X9 LF (GAUZE/BANDAGES/DRESSINGS) ×1 IMPLANT
BLADE CUDA GRT WHITE 3.5 (BLADE) ×3 IMPLANT
BLADE GREAT WHITE 4.2 (BLADE) ×2 IMPLANT
BLADE GREAT WHITE 4.2MM (BLADE) ×1
BNDG ESMARK 6X9 LF (GAUZE/BANDAGES/DRESSINGS) ×3
BNDG GAUZE ELAST 4 BULKY (GAUZE/BANDAGES/DRESSINGS) ×3 IMPLANT
CANISTER SUCT LVC 12 LTR MEDI- (MISCELLANEOUS) ×3 IMPLANT
CANISTER SUCTION 1200CC (MISCELLANEOUS) ×3 IMPLANT
CLOTH BEACON ORANGE TIMEOUT ST (SAFETY) IMPLANT
CUFF TOURNIQUET SINGLE 34IN LL (TOURNIQUET CUFF) ×3 IMPLANT
DRAPE ARTHROSCOPY W/POUCH 114 (DRAPES) ×3 IMPLANT
DRAPE INCISE 23X17 IOBAN STRL (DRAPES) ×2
DRAPE INCISE IOBAN 23X17 STRL (DRAPES) ×1 IMPLANT
DRAPE INCISE IOBAN 66X45 STRL (DRAPES) ×3 IMPLANT
DRSG PAD ABDOMINAL 8X10 ST (GAUZE/BANDAGES/DRESSINGS) ×3 IMPLANT
DURAPREP 26ML APPLICATOR (WOUND CARE) ×3 IMPLANT
ELECT MENISCUS 165MM 90D (ELECTRODE) IMPLANT
ELECT REM PT RETURN 9FT ADLT (ELECTROSURGICAL)
ELECTRODE REM PT RTRN 9FT ADLT (ELECTROSURGICAL) IMPLANT
GAUZE XEROFORM 1X8 LF (GAUZE/BANDAGES/DRESSINGS) ×3 IMPLANT
GLOVE BIO SURGEON STRL SZ 6.5 (GLOVE) ×2 IMPLANT
GLOVE BIO SURGEON STRL SZ7.5 (GLOVE) ×3 IMPLANT
GLOVE BIO SURGEONS STRL SZ 6.5 (GLOVE) ×1
GLOVE INDICATOR 6.5 STRL GRN (GLOVE) ×3 IMPLANT
GLOVE INDICATOR 8.0 STRL GRN (GLOVE) ×6 IMPLANT
GLOVE SURG ORTHO 8.0 STRL STRW (GLOVE) ×3 IMPLANT
GOWN STRL REUS W/ TWL LRG LVL3 (GOWN DISPOSABLE) ×1 IMPLANT
GOWN STRL REUS W/ TWL XL LVL3 (GOWN DISPOSABLE) ×2 IMPLANT
GOWN STRL REUS W/TWL LRG LVL3 (GOWN DISPOSABLE) ×2
GOWN STRL REUS W/TWL XL LVL3 (GOWN DISPOSABLE) ×4
IMMOBILIZER KNEE 22 UNIV (SOFTGOODS) IMPLANT
IMMOBILIZER KNEE 24 THIGH 36 (MISCELLANEOUS) IMPLANT
IMMOBILIZER KNEE 24 UNIV (MISCELLANEOUS)
IV NS IRRIG 3000ML ARTHROMATIC (IV SOLUTION) ×9 IMPLANT
KNEE WRAP E Z 3 GEL PACK (MISCELLANEOUS) ×3 IMPLANT
MINI VAC (SURGICAL WAND) ×3 IMPLANT
NEEDLE HYPO 22GX1.5 SAFETY (NEEDLE) ×3 IMPLANT
PACK ARTHROSCOPY DSU (CUSTOM PROCEDURE TRAY) ×3 IMPLANT
PACK BASIN DAY SURGERY FS (CUSTOM PROCEDURE TRAY) ×3 IMPLANT
PAD ABD 8X10 STRL (GAUZE/BANDAGES/DRESSINGS) ×6 IMPLANT
PADDING CAST ABS 4INX4YD NS (CAST SUPPLIES) ×2
PADDING CAST ABS COTTON 4X4 ST (CAST SUPPLIES) ×1 IMPLANT
PENCIL BUTTON HOLSTER BLD 10FT (ELECTRODE) IMPLANT
SET ARTHROSCOPY TUBING (MISCELLANEOUS) ×2
SET ARTHROSCOPY TUBING LN (MISCELLANEOUS) ×1 IMPLANT
SPONGE GAUZE 4X4 12PLY (GAUZE/BANDAGES/DRESSINGS) ×3 IMPLANT
SPONGE GAUZE 4X4 12PLY STER LF (GAUZE/BANDAGES/DRESSINGS) ×3 IMPLANT
SUT ETHILON 4 0 PS 2 18 (SUTURE) ×3 IMPLANT
SYR CONTROL 10ML LL (SYRINGE) ×3 IMPLANT
TOWEL OR 17X24 6PK STRL BLUE (TOWEL DISPOSABLE) ×3 IMPLANT
TUBE CONNECTING 12'X1/4 (SUCTIONS) ×1
TUBE CONNECTING 12X1/4 (SUCTIONS) ×2 IMPLANT
WAND 30 DEG SABER W/CORD (SURGICAL WAND) IMPLANT
WAND 90 DEG TURBOVAC W/CORD (SURGICAL WAND) IMPLANT
WATER STERILE IRR 500ML POUR (IV SOLUTION) ×3 IMPLANT

## 2013-12-06 NOTE — Anesthesia Preprocedure Evaluation (Addendum)
Anesthesia Evaluation  Patient identified by MRN, date of birth, ID band Patient awake    Reviewed: Allergy & Precautions, H&P , NPO status , Patient's Chart, lab work & pertinent test results  Airway Mallampati: II  TM Distance: >3 FB Neck ROM: full    Dental  (+) Dental Advisory Given, Caps Veneers upper front and several caps lower and upper:   Pulmonary asthma ,  Mild asthma breath sounds clear to auscultation  Pulmonary exam normal       Cardiovascular Exercise Tolerance: Good hypertension, Pt. on medications Rhythm:regular Rate:Normal     Neuro/Psych  Headaches, negative neurological ROS  negative psych ROS   GI/Hepatic negative GI ROS, Neg liver ROS, hiatal hernia, GERD-  ,  Endo/Other  negative endocrine ROS  Renal/GU negative Renal ROS  negative genitourinary   Musculoskeletal   Abdominal   Peds  Hematology negative hematology ROS (+)   Anesthesia Other Findings   Reproductive/Obstetrics negative OB ROS                            Anesthesia Physical Anesthesia Plan  ASA: II  Anesthesia Plan: General   Post-op Pain Management:    Induction: Intravenous  Airway Management Planned: LMA  Additional Equipment:   Intra-op Plan:   Post-operative Plan:   Informed Consent: I have reviewed the patients History and Physical, chart, labs and discussed the procedure including the risks, benefits and alternatives for the proposed anesthesia with the patient or authorized representative who has indicated his/her understanding and acceptance.   Dental Advisory Given  Plan Discussed with: CRNA and Surgeon  Anesthesia Plan Comments:         Anesthesia Quick Evaluation

## 2013-12-06 NOTE — Anesthesia Procedure Notes (Signed)
Procedure Name: LMA Insertion Date/Time: 12/06/2013 3:07 PM Performed by: Denna Haggard D Pre-anesthesia Checklist: Patient identified, Emergency Drugs available, Suction available and Patient being monitored Patient Re-evaluated:Patient Re-evaluated prior to inductionOxygen Delivery Method: Circle System Utilized Preoxygenation: Pre-oxygenation with 100% oxygen Intubation Type: IV induction Ventilation: Mask ventilation without difficulty LMA: LMA inserted LMA Size: 4.0 Number of attempts: 1 Airway Equipment and Method: bite block Placement Confirmation: positive ETCO2 Tube secured with: Tape Dental Injury: Teeth and Oropharynx as per pre-operative assessment

## 2013-12-06 NOTE — Transfer of Care (Signed)
Immediate Anesthesia Transfer of Care Note  Patient: [name], Dr.  Jule Ser) Performed: Procedure(s) (LRB): ARTHROSCOPY RIGHT KNEE WITH DEBRIDMENT AND PARTIAL MEDIAL AND LATERAL MENISCECTOMY AND CHONDRLPLASTY (Right)  Patient Location: PACU  Anesthesia Type: General  Level of Consciousness: awake, oriented, sedated and patient cooperative  Airway & Oxygen Therapy: Patient Spontanous Breathing and Patient connected to face mask oxygen  Post-op Assessment: Report given to PACU RN and Post -op Vital signs reviewed and stable  Post vital signs: Reviewed and stable  Complications: No apparent anesthesia complications

## 2013-12-06 NOTE — Interval H&P Note (Signed)
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 After consideration of risks, benefits and other options for treatment, the patient has consented to  Procedure(s): ARTHROSCOPY KNEE WITH DEBRIDMENT AND PARTIAL MENISCECTOMY AND CHONDRLPLASTY (Right) as a surgical intervention .  The patient's history has been reviewed, patient examined, no change in status, stable for surgery.  I have reviewed the patient's chart and labs.  Questions were answered to the patient's satisfaction.     Alandra Sando ANDREW

## 2013-12-06 NOTE — Op Note (Signed)
Dictated# [id number]

## 2013-12-06 NOTE — Discharge Instructions (Signed)

## 2013-12-06 NOTE — H&P (Signed)
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   Chief Complaint: Right knee pain worsening over the past two weeks HPI: Patient presents with joint discomfort that had been persistent for a month, but worsening over the past two weeks. Despite conservative treatments, his discomfort has not improved. Other conservative and surgical treatments were discussed in detail. Patient wishes to proceed with surgery as consented. History of ACL revision. Denies SOB, CP, or calf pain. No Fever, chills, or nausea/ vomiting.   Past Medical History  Diagnosis Date  . GERD (gastroesophageal reflux disease)   . Hyperlipidemia   . Hypertension   . Knee internal derangement     RIGHT  . Mild asthma   . H/O hiatal hernia   . History of colon polyps   . BPH (benign prostatic hypertrophy)   . Wears glasses     Past Surgical History  Procedure Laterality Date  . Lumbar disc surgery  04-02-2007    L5 -- S1  . Knee arthroscopy Right X4  last one 2005  . Parathyroidectomy  1989    removal adenoma  . Esophagogastroduodenoscopy  11-15-2008  . Colonoscopy w/ polypectomy  01-21-2008  . Negative sleep study  per pt    History reviewed. No pertinent family history. Social History:  reports that he has never smoked. He has never used smokeless tobacco. He reports that he drinks alcohol. He reports that he does not use illicit drugs.  Allergies:  Allergies  Allergen Reactions  . Merthiolate [Thimerosal] Rash    Rash--local    Medications Prior to Admission  Medication Sig Dispense Refill  . ALBUTEROL IN Inhale into the lungs as needed.    Marland Kitchen atorvastatin (LIPITOR) 40 MG tablet TAKE 1 TABLET BY MOUTH ONCE DAILY---  takes in AM    . Multiple Vitamin (MULTIVITAMIN) tablet Take 1 tablet by mouth every morning.     . nebivolol (BYSTOLIC) 10 MG tablet Take 10 mg by mouth every morning.     . olmesartan-hydrochlorothiazide (BENICAR HCT) 40-12.5 MG per tablet Take 1 tablet by mouth every morning.    . pantoprazole  (PROTONIX) 40 MG tablet Take 40 mg by mouth every morning.    . silodosin (RAPAFLO) 8 MG CAPS capsule Take 8 mg by mouth at bedtime.    . solifenacin (VESICARE) 10 MG tablet Take 10 mg by mouth daily.      No results found for this or any previous visit (from the past 48 hour(s)). No results found.  Review of Systems  Constitutional: Negative.   HENT: Negative.   Eyes: Negative.   Cardiovascular: Negative.   Gastrointestinal: Negative.   Genitourinary: Negative.   Musculoskeletal: Positive for joint pain (Right knee).  Skin: Negative.   Neurological: Negative.   Endo/Heme/Allergies: Negative.   Psychiatric/Behavioral: Negative.     Blood pressure 156/92, pulse 49, temperature 97.9 F (36.6 C), temperature source Oral, resp. rate 18, height 6\' 2"  (1.88 m), weight 95.709 kg (211 lb), SpO2 100 %. Physical Exam  Constitutional: He is oriented to person, place, and time. He appears well-developed.  Neck: Normal range of motion.  Cardiovascular: Normal rate, normal heart sounds and intact distal pulses.   Respiratory: Effort normal and breath sounds normal.  GI: Bowel sounds are normal.  Genitourinary:  Deferred  Musculoskeletal:  Right knee pain at the medial side on his tibial plateau. Calf soft and non-tender.   Neurological: He is alert and oriented to person, place, and time.  Skin: Skin is warm and  dry.  Psychiatric: His behavior is normal.     Assessment/Plan: Right knee Internal derangement and OA Plan for Right knee arthroscopy PM, Chondroplasty, and debridement D/C home today Follow instructions Take medications as directed F/U in office in 7 days   Elchonon Maxson L 12/06/2013, 2:44 PM

## 2013-12-07 ENCOUNTER — Encounter (HOSPITAL_BASED_OUTPATIENT_CLINIC_OR_DEPARTMENT_OTHER): Payer: Self-pay | Admitting: Specialist

## 2013-12-07 NOTE — Op Note (Signed)
NAMEMEHAR, KIRKWOOD NO.:  [id number]  MEDICAL RECORD NO.:  [id number]  LOCATION:                                 FACILITY:  PHYSICIAN:  Cynda Familia, M.D.DATE OF BIRTH:  07-10-1960  DATE OF PROCEDURE:  12/06/2013 DATE OF DISCHARGE:  12/06/2013                              OPERATIVE REPORT   PREOPERATIVE DIAGNOSES:  Right knee internal derangement, torn meniscus versus chondromalacia with pseudo-catching, and osteoarthritis.  POSTOPERATIVE DIAGNOSES: 1. Right knee large complex bucket-handle tear medial meniscus. 2. Osteoarthritis grade 3-4 patellofemoral joint, grade 4 medial     compartment, and grade 3-4 lateral compartment, torn medial     meniscus, torn lateral meniscus. 3. Grade 4 chondromalacia medial compartment, grade 3-4 patellofemoral     and grade 3-4 lateral compartment. 4. Mucinous degeneration of ACL graft.  PROCEDURES: 1. Right knee arthroscopic partial medial meniscectomy. 2. Partial lateral meniscectomy. 3. Tricompartmental chondroplasty.  SURGEON:  Cynda Familia, MD  ASSISTANT:  Wyatt Portela, PA-C  ANESTHESIA:  General intraoperative knee block.  ESTIMATED BLOOD LOSS:  Minimal.  DRAINS:  None.  COMPLICATIONS:  None.  TOURNIQUET TIME:  40 minutes at 250 mmHg.  DISPOSITION:  PACU stable.  OPERATIVE DETAILS:  The patient was counseled in the holding area. Correct site was identified.  IV started.  IV antibiotics were given preoperatively.  Taken to the operating room, placed in supine position in general anesthesia.  Right thigh was placed in a thigh holder and prepped with DuraPrep and draped in sterile fashion.  Time-out was done confirming the right side, exsanguinated with Esmarch, tourniquet was inflated to 250 mmHg.  Arthroscopic portals were established proximal medial, inferomedial, and inferolateral.  Diagnostic arthroscopy was undertaken.  Patellofemoral joint revealed normal tracking, but  there was grade 3 to 4 chondromalacia and osteoarthritis and chondroplasty __________ mechanical shaver back to stable base.  Suprapatellar pouch, medial and lateral gutters were unremarkable.  Intercondylar notch revealed __________ degenerative change and osteophyte notch overgrowth. ACL showed mucinous degeneration of no functioning fibers of any significance.  PCL was intact.  There was a small osteochondral loose body, intercondylar notch was removed.  Medial compartments were inspected, large grade 4 chondromalacia, medial femoral condyle, and grade 3 medial tibial plateau.  Mechanical compress was performed on edges back to stable base.  The medial meniscus showed what looked like a broken bucket-handle tear of the medial meniscus with a large pedunculated flap, displaced into the medial gutter.  It was reduced by a motorized shaver, smoothed down with cautery system.  The posterior aspect was identified, smoothed down with a shaver and cautery system. The medial meniscal remnant was stable.  No abnormalities were seen posteromedial and posterolateral.  No loose bodies other than intercondylar notch were encountered.  Lateral compartment was inspected and there was a grade 3-4 chondromalacia lateral compartment, __________ base.  There was also mucinous degeneration at the anterior half of the medial lateral meniscus, was frayed and torn.  Utilizing motorized shaver, partial lateral meniscectomy was performed back to stable base, smoothed down with cautery system.  __________ were inspected and there were no other abnormalities noted.  __________ gutters.  __________ was  removed.  __________ suture, 10 mL of Sensorcaine with 4 mg morphine sulfate was injected in the joint.  20 mL of 0.25 Sensorcaine placed in skin edges.  __________ TED hose, ice pad.  No complications.  He was then awakened and taken from operating room to PACU in stable condition. He will be stabilized in the  PACU, discharged home.          ______________________________ Cynda Familia, M.D.     RAC/MEDQ  D:  12/06/2013  T:  12/07/2013  Job:  [id number]

## 2013-12-09 NOTE — Anesthesia Postprocedure Evaluation (Signed)
  Anesthesia Post-op Note  Patient: [name], Dr.  Jule Ser) Performed: Procedure(s): ARTHROSCOPY RIGHT KNEE WITH DEBRIDMENT AND PARTIAL MEDIAL AND LATERAL MENISCECTOMY AND CHONDRLPLASTY (Right)  Patient Location: PACU  Anesthesia Type:General  Level of Consciousness: awake, alert  and oriented  Airway and Oxygen Therapy: Patient Spontanous Breathing  Post-op Pain: mild  Post-op Assessment: Post-op Vital signs reviewed, Patient's Cardiovascular Status Stable, Respiratory Function Stable, Patent Airway, No signs of Nausea or vomiting and Pain level controlled  Post-op Vital Signs: Reviewed and stable  Last Vitals:  Filed Vitals:   12/06/13 1733  BP: 150/88  Pulse: 54  Temp: 36.4 C  Resp: 18    Complications: No apparent anesthesia complications

## 2013-12-20 ENCOUNTER — Ambulatory Visit: Payer: 59 | Admitting: Physical Therapy

## 2014-01-23 ENCOUNTER — Other Ambulatory Visit: Payer: Self-pay | Admitting: Pulmonary Disease

## 2014-01-23 MED ORDER — ATORVASTATIN CALCIUM 40 MG PO TABS: ORAL_TABLET | ORAL | Status: DC

## 2014-02-09 ENCOUNTER — Encounter: Payer: Self-pay | Admitting: Pulmonary Disease

## 2014-02-09 ENCOUNTER — Ambulatory Visit (INDEPENDENT_AMBULATORY_CARE_PROVIDER_SITE_OTHER): Payer: 59 | Admitting: Pulmonary Disease

## 2014-02-09 VITALS — BP 136/90 | HR 63 | Temp 97.5°F | Ht 74.0 in | Wt 210.0 lb

## 2014-02-09 DIAGNOSIS — I10 Essential (primary) hypertension: Secondary | ICD-10-CM

## 2014-02-09 DIAGNOSIS — N401 Enlarged prostate with lower urinary tract symptoms: Secondary | ICD-10-CM | POA: Insufficient documentation

## 2014-02-09 DIAGNOSIS — M1710 Unilateral primary osteoarthritis, unspecified knee: Secondary | ICD-10-CM | POA: Insufficient documentation

## 2014-02-09 DIAGNOSIS — Z8719 Personal history of other diseases of the digestive system: Secondary | ICD-10-CM

## 2014-02-09 DIAGNOSIS — Z800 Family history of malignant neoplasm of digestive organs: Secondary | ICD-10-CM

## 2014-02-09 DIAGNOSIS — J45909 Unspecified asthma, uncomplicated: Secondary | ICD-10-CM

## 2014-02-09 DIAGNOSIS — Z0000 Encounter for general adult medical examination without abnormal findings: Secondary | ICD-10-CM

## 2014-02-09 DIAGNOSIS — Z8042 Family history of malignant neoplasm of prostate: Secondary | ICD-10-CM

## 2014-02-09 DIAGNOSIS — N138 Other obstructive and reflux uropathy: Secondary | ICD-10-CM

## 2014-02-09 DIAGNOSIS — M1711 Unilateral primary osteoarthritis, right knee: Secondary | ICD-10-CM

## 2014-02-09 DIAGNOSIS — E7800 Pure hypercholesterolemia, unspecified: Secondary | ICD-10-CM

## 2014-02-09 DIAGNOSIS — K5730 Diverticulosis of large intestine without perforation or abscess without bleeding: Secondary | ICD-10-CM

## 2014-02-09 DIAGNOSIS — M179 Osteoarthritis of knee, unspecified: Secondary | ICD-10-CM | POA: Insufficient documentation

## 2014-02-09 DIAGNOSIS — Z8739 Personal history of other diseases of the musculoskeletal system and connective tissue: Secondary | ICD-10-CM

## 2014-02-09 MED ORDER — NEBIVOLOL HCL 10 MG PO TABS: 10.0000 mg | ORAL_TABLET | Freq: Every morning | ORAL | Status: DC

## 2014-02-09 NOTE — Patient Instructions (Signed)
Today we updated your med list in our EPIC system...    Continue your current medications the same...  Please send [place] a copy of your "Doctor Day" labs to scan into the record...  Please continue the BREO daily & plan a follow up spirometry in 3-63mo to check for reversibility to your airflow obstruction...  I will discuss the poss of CalciumScore vs CardiacCT w/ CARDS & let you know...  Call for any questions...  Let's plan a follow up visit in 53yr, sooner if needed for problems.Marland KitchenMarland Kitchen
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
## 2014-07-14 ENCOUNTER — Other Ambulatory Visit: Payer: Self-pay | Admitting: Pulmonary Disease

## 2014-07-26 ENCOUNTER — Other Ambulatory Visit: Payer: Self-pay | Admitting: Gastroenterology

## 2014-08-21 ENCOUNTER — Encounter: Payer: Self-pay | Admitting: Pulmonary Disease

## 2014-09-12 ENCOUNTER — Other Ambulatory Visit: Payer: Self-pay | Admitting: *Deleted

## 2014-09-12 MED ORDER — OLMESARTAN MEDOXOMIL-HCTZ 40-12.5 MG PO TABS: 1.0000 | ORAL_TABLET | Freq: Every morning | ORAL | Status: DC

## 2014-09-12 MED ORDER — NEBIVOLOL HCL 10 MG PO TABS: 10.0000 mg | ORAL_TABLET | Freq: Every morning | ORAL | Status: DC

## 2014-09-12 MED ORDER — SOLIFENACIN SUCCINATE 5 MG PO TABS: 5.0000 mg | ORAL_TABLET | Freq: Every day | ORAL | Status: DC

## 2015-06-04 ENCOUNTER — Other Ambulatory Visit: Payer: Self-pay | Admitting: Emergency Medicine

## 2015-06-04 ENCOUNTER — Other Ambulatory Visit: Payer: Self-pay | Admitting: Pulmonary Disease

## 2015-06-04 DIAGNOSIS — I10 Essential (primary) hypertension: Secondary | ICD-10-CM

## 2015-06-04 DIAGNOSIS — F411 Generalized anxiety disorder: Secondary | ICD-10-CM

## 2015-06-04 DIAGNOSIS — E559 Vitamin D deficiency, unspecified: Secondary | ICD-10-CM

## 2015-06-04 DIAGNOSIS — E7800 Pure hypercholesterolemia, unspecified: Secondary | ICD-10-CM

## 2015-06-04 MED ORDER — FLUTICASONE FUROATE-VILANTEROL 100-25 MCG/INH IN AEPB
1.0000 | INHALATION_SPRAY | Freq: Every day | RESPIRATORY_TRACT | Status: DC
Start: 2015-06-04 — End: 2017-10-22

## 2015-06-04 MED ORDER — OMEPRAZOLE 40 MG PO CPDR: 40.0000 mg | DELAYED_RELEASE_CAPSULE | Freq: Every day | ORAL | Status: DC

## 2015-06-04 MED ORDER — ATORVASTATIN CALCIUM 40 MG PO TABS: ORAL_TABLET | ORAL | Status: DC

## 2015-06-21 ENCOUNTER — Other Ambulatory Visit (INDEPENDENT_AMBULATORY_CARE_PROVIDER_SITE_OTHER): Payer: 59

## 2015-06-21 DIAGNOSIS — I10 Essential (primary) hypertension: Secondary | ICD-10-CM | POA: Diagnosis not present

## 2015-06-21 DIAGNOSIS — F411 Generalized anxiety disorder: Secondary | ICD-10-CM

## 2015-06-21 DIAGNOSIS — E559 Vitamin D deficiency, unspecified: Secondary | ICD-10-CM | POA: Diagnosis not present

## 2015-06-21 DIAGNOSIS — E7800 Pure hypercholesterolemia, unspecified: Secondary | ICD-10-CM

## 2015-06-21 LAB — LIPID PANEL
Cholesterol: 183 mg/dL (ref 0–200)
HDL: 39.3 mg/dL (ref >39.00)
LDL CALC: 122 mg/dL — AB (ref 0–99)
NonHDL: 143.41
Total CHOL/HDL Ratio: 5
Triglycerides: 109 mg/dL (ref 0.0–149.0)
VLDL: 21.8 mg/dL (ref 0.0–40.0)

## 2015-06-21 LAB — CBC
HCT: 39 % (ref 39.0–52.0)
Hemoglobin: 13.5 g/dL (ref 13.0–17.0)
MCHC: 34.6 g/dL (ref 30.0–36.0)
MCV: 86.8 fl (ref 78.0–100.0)
PLATELETS: 253 10*3/uL (ref 150.0–400.0)
RBC: 4.49 Mil/uL (ref 4.22–5.81)
RDW: 13.3 % (ref 11.5–15.5)
WBC: 5.1 10*3/uL (ref 4.0–10.5)

## 2015-06-21 LAB — COMPREHENSIVE METABOLIC PANEL
ALBUMIN: 4 g/dL (ref 3.5–5.2)
ALT: 27 U/L (ref 0–53)
AST: 24 U/L (ref 0–37)
Alkaline Phosphatase: 59 U/L (ref 39–117)
BUN: 17 mg/dL (ref 6–23)
CALCIUM: 9.2 mg/dL (ref 8.4–10.5)
CHLORIDE: 106 meq/L (ref 96–112)
CO2: 30 mEq/L (ref 19–32)
Creatinine, Ser: 1.16 mg/dL (ref 0.40–1.50)
GFR: 69.02 mL/min (ref >60.00)
Glucose, Bld: 91 mg/dL (ref 70–99)
Potassium: 4.2 mEq/L (ref 3.5–5.1)
SODIUM: 142 meq/L (ref 135–145)
Total Bilirubin: 0.4 mg/dL (ref 0.2–1.2)
Total Protein: 6.5 g/dL (ref 6.0–8.3)

## 2015-06-21 LAB — VITAMIN D 25 HYDROXY (VIT D DEFICIENCY, FRACTURES): VITD: 11.51 ng/mL — AB (ref 30.00–100.00)

## 2015-06-21 LAB — TSH: TSH: 2.11 u[IU]/mL (ref 0.35–4.50)

## 2015-09-16 ENCOUNTER — Other Ambulatory Visit: Payer: Self-pay | Admitting: Pulmonary Disease

## 2015-09-21 ENCOUNTER — Telehealth: Payer: Self-pay | Admitting: Pulmonary Disease

## 2015-09-21 NOTE — Telephone Encounter (Signed)
Llewelyn called for refill of his BENICAR-HCT 40-12.5 and we authorized #[id number] w/ 4 refills called to his mail order pharm at [phone]...  We also discussed his LABS from 06/21/15>> FLP is fair on Atorva40 w/ TChol 183, TG 109, HDL 39, LDL 122 Chems- wnl;  CBC- wnl;  TSH=2.11;  Vit D = 11.5.Marland KitchenMarland Kitchen We placed him on VitD 50K weekly dose at that time.Marland KitchenMarland Kitchen

## 2016-02-25 ENCOUNTER — Telehealth: Payer: Self-pay | Admitting: Pulmonary Disease

## 2016-02-25 ENCOUNTER — Other Ambulatory Visit: Payer: Self-pay | Admitting: Pulmonary Disease

## 2016-02-25 ENCOUNTER — Ambulatory Visit (INDEPENDENT_AMBULATORY_CARE_PROVIDER_SITE_OTHER)
Admission: RE | Admit: 2016-02-25 | Discharge: 2016-02-25 | Disposition: A | Discharge status: Home / Self Care | Payer: 59 | Source: Ambulatory Visit | Attending: Pulmonary Disease | Admitting: Pulmonary Disease

## 2016-02-25 ENCOUNTER — Ambulatory Visit: Payer: 59 | Admitting: Pulmonary Disease

## 2016-02-25 DIAGNOSIS — R059 Cough, unspecified: Secondary | ICD-10-CM

## 2016-02-25 DIAGNOSIS — R05 Cough: Secondary | ICD-10-CM

## 2016-02-25 NOTE — Telephone Encounter (Signed)
Daryl called w/ persistent cough, chest congestion, and wheezing;  Recall that wife & sis-in-law both had pneumonia over Christmas, treated & improved;  Tywon started w/ cough, sinus & chest congestion, drainage, etc but no f/c/s or sore throat;  We called in [place] & Pred taper and he reported a nice initial response to Rx including a few NEB treatments from daughter's machine...    He has maintained on [place] for the last 28yr and spirometry improved on this regimen w/o acute exac until now...    [facility] and while there he had an acute flair of the AB w/ marked incr congestion, wheezing, SOB & some thick sput hard to expectorate; [facility] & [facility] found sats 88-92% on RA, gave him several NEB treatmernts in a row & he improved sufficiently for disch...    [facility], thick mucus, wheezing, etc; again he denies f/c/s, sore throat, & not expectorating any phlegm...    We had him come by the office- Afeb, VSS, Chest w/ exp wheezing & rhonchi L>R but no consolidation;  CXR looks clear & unchanged from old films...    We discussed Rx w/ longer Pred taper 20Bid x4d, 30/d x4d, 20/d x4d, 10/d x4d;  Plus [id number] w/ Fluids and NEB treatments w/ ALBUTEROL Qid prn (he also has ProairHFA when out & about; [facility] & keep usKoreanformed of his progress...Marland KitchenMarland Kitchen

## 2016-07-09 ENCOUNTER — Other Ambulatory Visit: Payer: Self-pay | Admitting: Pulmonary Disease

## 2016-07-09 DIAGNOSIS — Z0000 Encounter for general adult medical examination without abnormal findings: Secondary | ICD-10-CM

## 2016-07-11 ENCOUNTER — Other Ambulatory Visit: Payer: Self-pay | Admitting: Pulmonary Disease

## 2016-08-01 ENCOUNTER — Other Ambulatory Visit: Payer: Self-pay | Admitting: Pulmonary Disease

## 2016-08-01 ENCOUNTER — Other Ambulatory Visit (INDEPENDENT_AMBULATORY_CARE_PROVIDER_SITE_OTHER): Payer: Federal, State, Local not specified - PPO

## 2016-08-01 DIAGNOSIS — Z0000 Encounter for general adult medical examination without abnormal findings: Secondary | ICD-10-CM | POA: Diagnosis not present

## 2016-08-01 LAB — COMPREHENSIVE METABOLIC PANEL
ALBUMIN: 4.4 g/dL (ref 3.6–5.1)
ALT: 22 U/L (ref 9–46)
AST: 19 U/L (ref 10–35)
Alkaline Phosphatase: 67 U/L (ref 40–115)
BILIRUBIN TOTAL: 0.7 mg/dL (ref 0.2–1.2)
BUN: 21 mg/dL (ref 7–25)
CHLORIDE: 103 mmol/L (ref 98–110)
CO2: 22 mmol/L (ref 20–31)
CREATININE: 1.2 mg/dL (ref 0.70–1.33)
Calcium: 9.5 mg/dL (ref 8.6–10.3)
Glucose, Bld: 100 mg/dL — ABNORMAL HIGH (ref 65–99)
Potassium: 4.2 mmol/L (ref 3.5–5.3)
SODIUM: 139 mmol/L (ref 135–146)
TOTAL PROTEIN: 6.7 g/dL (ref 6.1–8.1)

## 2016-08-01 LAB — LIPID PANEL
CHOLESTEROL: 220 mg/dL — AB (ref <200)
HDL: 48 mg/dL (ref >40)
LDL CALC: 141 mg/dL — AB (ref <100)
Total CHOL/HDL Ratio: 4.6 Ratio (ref <5.0)
Triglycerides: 154 mg/dL — ABNORMAL HIGH (ref <150)
VLDL: 31 mg/dL — AB (ref <30)

## 2016-08-01 LAB — CBC WITH DIFFERENTIAL/PLATELET
BASOS ABS: 0 10*3/uL (ref 0.0–0.1)
Basophils Relative: 0.7 % (ref 0.0–3.0)
EOS ABS: 0.4 10*3/uL (ref 0.0–0.7)
EOS PCT: 7 % — AB (ref 0.0–5.0)
HCT: 41.4 % (ref 39.0–52.0)
HEMOGLOBIN: 14.2 g/dL (ref 13.0–17.0)
LYMPHS ABS: 1.4 10*3/uL (ref 0.7–4.0)
Lymphocytes Relative: 23.7 % (ref 12.0–46.0)
MCHC: 34.3 g/dL (ref 30.0–36.0)
MCV: 88.3 fl (ref 78.0–100.0)
MONO ABS: 0.6 10*3/uL (ref 0.1–1.0)
Monocytes Relative: 10.2 % (ref 3.0–12.0)
NEUTROS PCT: 58.4 % (ref 43.0–77.0)
Neutro Abs: 3.6 10*3/uL (ref 1.4–7.7)
Platelets: 286 10*3/uL (ref 150.0–400.0)
RBC: 4.68 Mil/uL (ref 4.22–5.81)
RDW: 14 % (ref 11.5–15.5)
WBC: 6.1 10*3/uL (ref 4.0–10.5)

## 2016-08-01 LAB — TSH: TSH: 1.75 mIU/L (ref 0.40–4.50)

## 2016-08-02 LAB — HEPATITIS C ANTIBODY: HCV Ab: NEGATIVE

## 2016-08-02 LAB — PSA: PSA: 3.2 ng/mL (ref <=4.0)

## 2016-08-02 LAB — VITAMIN D 25 HYDROXY (VIT D DEFICIENCY, FRACTURES): VIT D 25 HYDROXY: 37 ng/mL (ref 30–100)

## 2016-09-08 ENCOUNTER — Other Ambulatory Visit: Payer: Self-pay | Admitting: Pulmonary Disease

## 2016-10-11 ENCOUNTER — Other Ambulatory Visit: Payer: Self-pay | Admitting: Pulmonary Disease

## 2017-06-01 DIAGNOSIS — R3915 Urgency of urination: Secondary | ICD-10-CM | POA: Diagnosis not present

## 2017-06-01 DIAGNOSIS — R972 Elevated prostate specific antigen [PSA]: Secondary | ICD-10-CM | POA: Diagnosis not present

## 2017-06-01 DIAGNOSIS — N401 Enlarged prostate with lower urinary tract symptoms: Secondary | ICD-10-CM | POA: Diagnosis not present

## 2017-07-09 ENCOUNTER — Other Ambulatory Visit: Payer: Self-pay | Admitting: Pulmonary Disease

## 2017-07-09 DIAGNOSIS — M1711 Unilateral primary osteoarthritis, right knee: Secondary | ICD-10-CM | POA: Diagnosis not present

## 2017-08-12 ENCOUNTER — Telehealth: Payer: Self-pay | Admitting: Pulmonary Disease

## 2017-08-12 NOTE — Telephone Encounter (Signed)
Spoke with a Occupational psychologist at [facility]. She stated that [name] was calling in regards to the Benicar being on back order. All strengths of Benicar are currently on back order. They currently have all strengths of losartan/hctz and valsartan/hctz on hand.   Dr. Lenna Gilford, please advise if you wish to switch the patient to one of the mentioned medicines above. RX will need to be sent to [facility]. Thanks!

## 2017-08-12 NOTE — Telephone Encounter (Signed)
SN is aware and has spoke with Patient.  New prescription substitute to be called in by SN.

## 2017-08-19 DIAGNOSIS — M1711 Unilateral primary osteoarthritis, right knee: Secondary | ICD-10-CM | POA: Diagnosis not present

## 2017-08-24 ENCOUNTER — Other Ambulatory Visit: Payer: Self-pay | Admitting: Pulmonary Disease

## 2017-08-26 DIAGNOSIS — M1711 Unilateral primary osteoarthritis, right knee: Secondary | ICD-10-CM | POA: Diagnosis not present

## 2017-09-02 DIAGNOSIS — M1711 Unilateral primary osteoarthritis, right knee: Secondary | ICD-10-CM | POA: Diagnosis not present

## 2017-10-19 ENCOUNTER — Other Ambulatory Visit: Payer: Self-pay | Admitting: Pulmonary Disease

## 2017-10-22 ENCOUNTER — Other Ambulatory Visit: Payer: Self-pay | Admitting: *Deleted

## 2017-10-22 MED ORDER — FLUTICASONE FUROATE-VILANTEROL 100-25 MCG/INH IN AEPB: 1.0000 | INHALATION_SPRAY | Freq: Every day | RESPIRATORY_TRACT | 3 refills | Status: DC

## 2018-06-17 ENCOUNTER — Telehealth: Payer: Self-pay

## 2018-06-17 NOTE — Telephone Encounter (Signed)
Copied from [place] [id number]. Topic: Appointment Scheduling - Scheduling Inquiry for Clinic >> Jun 17, 2018  1:38 PM Lennox Solders wrote: Reason for CRM: pt would like to become a new pt to dr lown-chase . Dr [name] sees his wife .

## 2018-06-18 NOTE — Telephone Encounter (Signed)
VOV 06/24/18

## 2018-06-18 NOTE — Telephone Encounter (Signed)
Yes

## 2018-06-18 NOTE — Telephone Encounter (Signed)
Please advise 

## 2018-06-24 ENCOUNTER — Other Ambulatory Visit: Payer: Self-pay

## 2018-06-24 ENCOUNTER — Encounter: Payer: Self-pay | Admitting: Family Medicine

## 2018-06-24 ENCOUNTER — Ambulatory Visit (INDEPENDENT_AMBULATORY_CARE_PROVIDER_SITE_OTHER): Payer: Federal, State, Local not specified - PPO | Admitting: Family Medicine

## 2018-06-24 VITALS — BP 135/75 | Wt 195.0 lb

## 2018-06-24 VITALS — Wt 195.0 lb

## 2018-06-24 DIAGNOSIS — Q272 Other congenital malformations of renal artery: Secondary | ICD-10-CM | POA: Insufficient documentation

## 2018-06-24 DIAGNOSIS — E785 Hyperlipidemia, unspecified: Secondary | ICD-10-CM | POA: Diagnosis not present

## 2018-06-24 DIAGNOSIS — Z8249 Family history of ischemic heart disease and other diseases of the circulatory system: Secondary | ICD-10-CM

## 2018-06-24 DIAGNOSIS — E7849 Other hyperlipidemia: Secondary | ICD-10-CM

## 2018-06-24 DIAGNOSIS — N400 Enlarged prostate without lower urinary tract symptoms: Secondary | ICD-10-CM

## 2018-06-24 DIAGNOSIS — I10 Essential (primary) hypertension: Secondary | ICD-10-CM | POA: Diagnosis not present

## 2018-06-24 DIAGNOSIS — J4520 Mild intermittent asthma, uncomplicated: Secondary | ICD-10-CM | POA: Diagnosis not present

## 2018-06-24 MED ORDER — OLMESARTAN MEDOXOMIL-HCTZ 40-12.5 MG PO TABS: 1.0000 | ORAL_TABLET | Freq: Every day | ORAL | 3 refills | Status: DC

## 2018-06-24 MED ORDER — FLUTICASONE FUROATE-VILANTEROL 100-25 MCG/INH IN AEPB: 1.0000 | INHALATION_SPRAY | Freq: Every day | RESPIRATORY_TRACT | 3 refills | Status: DC

## 2018-06-24 MED ORDER — ALBUTEROL SULFATE HFA 108 (90 BASE) MCG/ACT IN AERS: 2.0000 | INHALATION_SPRAY | Freq: Four times a day (QID) | RESPIRATORY_TRACT | 0 refills | Status: DC | PRN

## 2018-06-24 MED ORDER — ATORVASTATIN CALCIUM 40 MG PO TABS: 40.0000 mg | ORAL_TABLET | Freq: Every morning | ORAL | 3 refills | Status: DC

## 2018-06-24 NOTE — Progress Notes (Signed)
Virtual Visit via Video Note  I connected with [name], Dr. on 06/24/18 at  9:15 AM EDT by a video enabled telemedicine application and verified that I am speaking with the correct person using two identifiers.  Location: Patient: work-- [employer]: home   I discussed the limitations of evaluation and management by telemedicine and the availability of in person appointments. The patient expressed understanding and agreed to proceed.  History of Present Illness: Pt is at work with no complaints.  His pcp retired.  He needs refills of bp med , chol and asthma meds.   Pt has sig family hx of coronary artery dz, carotid artery dz and htn.  He also has fam hx colon cancer.  He had w/u for pheo about 20 years ago and the only finding was a partially stenosed accessory R renal artery. Pt bp has been stable on current meds .      Past Medical History:  Diagnosis Date  . BPH (benign prostatic hypertrophy)   . GERD (gastroesophageal reflux disease)   . H/O hiatal hernia   . History of colon polyps   . Hyperlipidemia   . Hypertension   . Knee internal derangement    RIGHT  . Mild asthma   . Vitamin D deficiency   . Wears glasses    Social History   Socioeconomic History  . Marital status: Married    Spouse name: Mary Sella  . Number of children: 2  . Years of education: Not on file  . Highest education level: Not on file  Occupational History  . Occupation: physician  Social Needs  . Financial resource strain: Not on file  . Food insecurity:    Worry: Not on file    Inability: Not on file  . Transportation needs:    Medical: Not on file    Non-medical: Not on file  Tobacco Use  . Smoking status: Never Smoker  . Smokeless tobacco: Never Used  Substance and Sexual Activity  . Alcohol use: Yes    Alcohol/week: 0.0 standard drinks    Comment: social use  . Drug use: No  . Sexual activity: Yes    Partners: Female  Lifestyle  . Physical activity:    Days per  week: Not on file    Minutes per session: Not on file  . Stress: Not on file  Relationships  . Social connections:    Talks on phone: Not on file    Gets together: Not on file    Attends religious service: Not on file    Active member of club or organization: Not on file    Attends meetings of clubs or organizations: Not on file    Relationship status: Not on file  . Intimate partner violence:    Fear of current or ex partner: Not on file    Emotionally abused: Not on file    Physically abused: Not on file    Forced sexual activity: Not on file  Other Topics Concern  . Not on file  Social History Narrative  . Not on file   Current Outpatient Medications on File Prior to Visit  Medication Sig Dispense Refill  . ALBUTEROL IN Inhale into the lungs as needed.    . Albuterol Sulfate 2.5 MG/0.5ML NEBU Inhale into the lungs. 1 nebule every 6 hours as needed    . BYSTOLIC 10 MG tablet TAKE 1 TABLET DAILY 90 tablet 3  . Cholecalciferol (VITAMIN D) 50 MCG (2000  UT) tablet Take 2,000 Units by mouth daily.    . Coenzyme Q10 (CO Q 10 PO) Take 1 tablet by mouth daily.    . Multiple Vitamin (MULTIVITAMIN) tablet Take 1 tablet by mouth every morning.     Marland Kitchen omeprazole (PRILOSEC) 40 MG capsule TAKE 1 CAPSULE DAILY 90 capsule 3  . pantoprazole (PROTONIX) 40 MG tablet TAKE 1 TABLET BY MOUTH ONCE DAILY 90 tablet 3  . silodosin (RAPAFLO) 8 MG CAPS capsule Take 8 mg by mouth at bedtime.    . solifenacin (VESICARE) 5 MG tablet Take 1 tablet (5 mg total) by mouth daily. 90 tablet 4   No current facility-administered medications on file prior to visit.    Past Surgical History:  Procedure Laterality Date  . COLONOSCOPY W/ POLYPECTOMY  01-21-2008  . ESOPHAGOGASTRODUODENOSCOPY  11-15-2008  . KNEE ARTHROSCOPY Right X4  last one 2005  . KNEE ARTHROSCOPY Right 12/06/2013   Procedure: ARTHROSCOPY RIGHT KNEE WITH DEBRIDMENT AND PARTIAL MEDIAL AND LATERAL MENISCECTOMY AND CHONDRLPLASTY;  Surgeon: [name], MD;  Location: [place];  Service: Orthopedics;  Laterality: Right;  . LUMBAR DISC SURGERY  04-02-2007   L5 -- S1  . NEGATIVE SLEEP STUDY  per pt  . PARATHYROIDECTOMY  1989   removal adenoma   Observations/Objective: .135/75  75   Afebrile Pt is in NAD Neatly dressed   Assessment and Plan: 1. Mild intermittent asthma, unspecified whether complicated Stable on current meds  - albuterol (VENTOLIN HFA) 108 (90 Base) MCG/ACT inhaler; Inhale 2 puffs into the lungs every 6 (six) hours as needed for wheezing or shortness of breath.  Dispense: 1 Inhaler; Refill: 0 - fluticasone furoate-vilanterol (BREO ELLIPTA) 100-25 MCG/INH AEPB; Inhale 1 puff into the lungs daily.  Dispense: 180 each; Refill: 3  2. Essential hypertension Well controlled, no changes to meds. Encouraged heart healthy diet such as the DASH diet and exercise as tolerated.   - olmesartan-hydrochlorothiazide (BENICAR HCT) 40-12.5 MG tablet; Take 1 tablet by mouth daily.  Dispense: 90 tablet; Refill: 3 - CBC with Differential/Platelet; Future - Lipid panel; Future - PSA; Future - TSH; Future - Comprehensive metabolic panel; Future  3. Hyperlipidemia, unspecified hyperlipidemia type Tolerating statin, encouraged heart healthy diet, avoid trans fats, minimize simple carbs and saturated fats. Increase exercise as tolerated - atorvastatin (LIPITOR) 40 MG tablet; Take 1 tablet (40 mg total) by mouth every morning.  Dispense: 90 tablet; Refill: 3  4. Family history of carotid artery stenosis Pt may eventually want calcium score but he has seen Dr Stanford Breed in the past --- he will hole off on this for now  - US Carotid Bilateral; Future  5. BPH without urinary obstruction Per urology - PSA; Future  6. Familial hyperlipidemia Tolerating statin, encouraged heart healthy diet, avoid trans fats, minimize simple carbs and saturated fats. Increase exercise as tolerated - Lipid panel; Future - Comprehensive  metabolic panel; Future   Follow Up Instructions:    I discussed the assessment and treatment plan with the patient. The patient was provided an opportunity to ask questions and all were answered. The patient agreed with the plan and demonstrated an understanding of the instructions.   The patient was advised to call back or seek an in-person evaluation if the symptoms worsen or if the condition fails to improve as anticipated.  I provided 25 minutes of non-face-to-face time during this encounter.   Ann Held, DOmago

## 2018-07-15 ENCOUNTER — Other Ambulatory Visit: Payer: Federal, State, Local not specified - PPO

## 2018-07-29 ENCOUNTER — Other Ambulatory Visit: Payer: Federal, State, Local not specified - PPO

## 2018-07-30 ENCOUNTER — Other Ambulatory Visit (INDEPENDENT_AMBULATORY_CARE_PROVIDER_SITE_OTHER): Payer: Federal, State, Local not specified - PPO

## 2018-07-30 DIAGNOSIS — N400 Enlarged prostate without lower urinary tract symptoms: Secondary | ICD-10-CM | POA: Diagnosis not present

## 2018-07-30 DIAGNOSIS — I10 Essential (primary) hypertension: Secondary | ICD-10-CM

## 2018-07-30 DIAGNOSIS — E7849 Other hyperlipidemia: Secondary | ICD-10-CM

## 2018-07-30 LAB — CBC WITH DIFFERENTIAL/PLATELET
Basophils Absolute: 0.1 10*3/uL (ref 0.0–0.1)
Basophils Relative: 0.9 % (ref 0.0–3.0)
Eosinophils Absolute: 0.6 10*3/uL (ref 0.0–0.7)
Eosinophils Relative: 10.2 % — ABNORMAL HIGH (ref 0.0–5.0)
HCT: 43.3 % (ref 39.0–52.0)
Hemoglobin: 14.5 g/dL (ref 13.0–17.0)
Lymphocytes Relative: 31.8 % (ref 12.0–46.0)
Lymphs Abs: 1.9 10*3/uL (ref 0.7–4.0)
MCHC: 33.5 g/dL (ref 30.0–36.0)
MCV: 89.1 fl (ref 78.0–100.0)
Monocytes Absolute: 0.6 10*3/uL (ref 0.1–1.0)
Monocytes Relative: 10.9 % (ref 3.0–12.0)
Neutro Abs: 2.7 10*3/uL (ref 1.4–7.7)
Neutrophils Relative %: 46.2 % (ref 43.0–77.0)
Platelets: 277 10*3/uL (ref 150.0–400.0)
RBC: 4.87 Mil/uL (ref 4.22–5.81)
RDW: 14.3 % (ref 11.5–15.5)
WBC: 5.9 10*3/uL (ref 4.0–10.5)

## 2018-07-30 LAB — COMPREHENSIVE METABOLIC PANEL
ALT: 35 U/L (ref 0–53)
AST: 23 U/L (ref 0–37)
Albumin: 4.4 g/dL (ref 3.5–5.2)
Alkaline Phosphatase: 74 U/L (ref 39–117)
BUN: 21 mg/dL (ref 6–23)
CO2: 29 mEq/L (ref 19–32)
Calcium: 9.7 mg/dL (ref 8.4–10.5)
Chloride: 101 mEq/L (ref 96–112)
Creatinine, Ser: 1.12 mg/dL (ref 0.40–1.50)
GFR: 66.9 mL/min (ref >60.00)
Glucose, Bld: 106 mg/dL — ABNORMAL HIGH (ref 70–99)
Potassium: 3.9 mEq/L (ref 3.5–5.1)
Sodium: 139 mEq/L (ref 135–145)
Total Bilirubin: 0.8 mg/dL (ref 0.2–1.2)
Total Protein: 6.8 g/dL (ref 6.0–8.3)

## 2018-07-30 LAB — LIPID PANEL
Cholesterol: 190 mg/dL (ref 0–200)
HDL: 45.5 mg/dL (ref >39.00)
LDL Cholesterol: 120 mg/dL — ABNORMAL HIGH (ref 0–99)
NonHDL: 144.55
Total CHOL/HDL Ratio: 4
Triglycerides: 124 mg/dL (ref 0.0–149.0)
VLDL: 24.8 mg/dL (ref 0.0–40.0)

## 2018-07-30 LAB — TSH: TSH: 1.89 u[IU]/mL (ref 0.35–4.50)

## 2018-07-30 LAB — PSA: PSA: 4.25 ng/mL — ABNORMAL HIGH (ref 0.10–4.00)

## 2018-08-03 ENCOUNTER — Encounter: Payer: Self-pay | Admitting: Family Medicine

## 2018-08-04 NOTE — Telephone Encounter (Signed)
i'm not sure why his labs were not released to him  Please send the labs to him and let him know I'm fine waiting until Nov to discuss

## 2018-09-17 ENCOUNTER — Other Ambulatory Visit (HOSPITAL_BASED_OUTPATIENT_CLINIC_OR_DEPARTMENT_OTHER): Payer: Federal, State, Local not specified - PPO

## 2018-10-18 ENCOUNTER — Other Ambulatory Visit: Payer: Self-pay

## 2018-10-18 ENCOUNTER — Ambulatory Visit (HOSPITAL_BASED_OUTPATIENT_CLINIC_OR_DEPARTMENT_OTHER)
Admission: RE | Admit: 2018-10-18 | Discharge: 2018-10-18 | Disposition: A | Discharge status: Home / Self Care | Payer: Federal, State, Local not specified - PPO | Source: Ambulatory Visit | Attending: Family Medicine | Admitting: Family Medicine

## 2018-10-18 ENCOUNTER — Other Ambulatory Visit: Payer: Self-pay | Admitting: Family Medicine

## 2018-10-18 DIAGNOSIS — I6523 Occlusion and stenosis of bilateral carotid arteries: Secondary | ICD-10-CM | POA: Diagnosis not present

## 2018-10-18 DIAGNOSIS — Z8249 Family history of ischemic heart disease and other diseases of the circulatory system: Secondary | ICD-10-CM | POA: Diagnosis not present

## 2018-10-18 DIAGNOSIS — E041 Nontoxic single thyroid nodule: Secondary | ICD-10-CM

## 2018-10-27 DIAGNOSIS — R3915 Urgency of urination: Secondary | ICD-10-CM | POA: Diagnosis not present

## 2018-10-27 DIAGNOSIS — N401 Enlarged prostate with lower urinary tract symptoms: Secondary | ICD-10-CM | POA: Diagnosis not present

## 2018-10-27 DIAGNOSIS — R972 Elevated prostate specific antigen [PSA]: Secondary | ICD-10-CM | POA: Diagnosis not present

## 2018-12-15 ENCOUNTER — Encounter: Payer: Self-pay | Admitting: Family Medicine

## 2018-12-15 DIAGNOSIS — C61 Malignant neoplasm of prostate: Secondary | ICD-10-CM | POA: Diagnosis not present

## 2018-12-15 DIAGNOSIS — R3915 Urgency of urination: Secondary | ICD-10-CM | POA: Diagnosis not present

## 2018-12-15 DIAGNOSIS — R972 Elevated prostate specific antigen [PSA]: Secondary | ICD-10-CM | POA: Diagnosis not present

## 2018-12-15 DIAGNOSIS — N401 Enlarged prostate with lower urinary tract symptoms: Secondary | ICD-10-CM | POA: Diagnosis not present

## 2018-12-24 ENCOUNTER — Encounter: Payer: Federal, State, Local not specified - PPO | Admitting: Family Medicine

## 2019-02-24 ENCOUNTER — Encounter: Payer: Federal, State, Local not specified - PPO | Admitting: Family Medicine

## 2019-04-07 ENCOUNTER — Other Ambulatory Visit: Payer: Self-pay

## 2019-04-08 ENCOUNTER — Other Ambulatory Visit: Payer: Self-pay

## 2019-04-08 ENCOUNTER — Encounter: Payer: Self-pay | Admitting: Family Medicine

## 2019-04-08 ENCOUNTER — Ambulatory Visit (INDEPENDENT_AMBULATORY_CARE_PROVIDER_SITE_OTHER): Payer: Federal, State, Local not specified - PPO | Admitting: Family Medicine

## 2019-04-08 VITALS — BP 108/70 | HR 66 | Temp 97.8°F | Resp 18 | Ht 74.0 in | Wt 203.6 lb

## 2019-04-08 DIAGNOSIS — Z0000 Encounter for general adult medical examination without abnormal findings: Secondary | ICD-10-CM | POA: Insufficient documentation

## 2019-04-08 DIAGNOSIS — K219 Gastro-esophageal reflux disease without esophagitis: Secondary | ICD-10-CM | POA: Diagnosis not present

## 2019-04-08 DIAGNOSIS — D229 Melanocytic nevi, unspecified: Secondary | ICD-10-CM | POA: Diagnosis not present

## 2019-04-08 DIAGNOSIS — Z8719 Personal history of other diseases of the digestive system: Secondary | ICD-10-CM

## 2019-04-08 DIAGNOSIS — E559 Vitamin D deficiency, unspecified: Secondary | ICD-10-CM | POA: Insufficient documentation

## 2019-04-08 DIAGNOSIS — E041 Nontoxic single thyroid nodule: Secondary | ICD-10-CM | POA: Insufficient documentation

## 2019-04-08 DIAGNOSIS — C61 Malignant neoplasm of prostate: Secondary | ICD-10-CM | POA: Insufficient documentation

## 2019-04-08 DIAGNOSIS — E785 Hyperlipidemia, unspecified: Secondary | ICD-10-CM

## 2019-04-08 DIAGNOSIS — I10 Essential (primary) hypertension: Secondary | ICD-10-CM

## 2019-04-08 DIAGNOSIS — I6529 Occlusion and stenosis of unspecified carotid artery: Secondary | ICD-10-CM | POA: Insufficient documentation

## 2019-04-08 MED ORDER — OMEPRAZOLE 40 MG PO CPDR: 40.0000 mg | DELAYED_RELEASE_CAPSULE | Freq: Every day | ORAL | 3 refills | Status: DC

## 2019-04-08 NOTE — Patient Instructions (Signed)
Preventive Care 85-61 Years Old, Male Preventive care refers to lifestyle choices and visits with your health care provider that can promote health and wellness. This includes:  A yearly physical exam. This is also called an annual well check.  Regular dental and eye exams.  Immunizations.  Screening for certain conditions.  Healthy lifestyle choices, such as eating a healthy diet, getting regular exercise, not using drugs or products that contain nicotine and tobacco, and limiting alcohol use. What can I expect for my preventive care visit? Physical exam Your health care provider will check:  Height and weight. These may be used to calculate body mass index (BMI), which is a measurement that tells if you are at a healthy weight.  Heart rate and blood pressure.  Your skin for abnormal spots. Counseling Your health care provider may ask you questions about:  Alcohol, tobacco, and drug use.  Emotional well-being.  Home and relationship well-being.  Sexual activity.  Eating habits.  Work and work Statistician. What immunizations do I need?  Influenza (flu) vaccine  This is recommended every year. Tetanus, diphtheria, and pertussis (Tdap) vaccine  You may need a Td booster every 10 years. Varicella (chickenpox) vaccine  You may need this vaccine if you have not already been vaccinated. Zoster (shingles) vaccine  You may need this after age 6. Measles, mumps, and rubella (MMR) vaccine  You may need at least one dose of MMR if you were born in 1957 or later. You may also need a second dose. Pneumococcal conjugate (PCV13) vaccine  You may need this if you have certain conditions and were not previously vaccinated. Pneumococcal polysaccharide (PPSV23) vaccine  You may need one or two doses if you smoke cigarettes or if you have certain conditions. Meningococcal conjugate (MenACWY) vaccine  You may need this if you have certain conditions. Hepatitis A  vaccine  You may need this if you have certain conditions or if you travel or work in places where you may be exposed to hepatitis A. Hepatitis B vaccine  You may need this if you have certain conditions or if you travel or work in places where you may be exposed to hepatitis B. Haemophilus influenzae type b (Hib) vaccine  You may need this if you have certain risk factors. Human papillomavirus (HPV) vaccine  If recommended by your health care provider, you may need three doses over 6 months. You may receive vaccines as individual doses or as more than one vaccine together in one shot (combination vaccines). Talk with your health care provider about the risks and benefits of combination vaccines. What tests do I need? Blood tests  Lipid and cholesterol levels. These may be checked every 5 years, or more frequently if you are over 59 years old.  Hepatitis C test.  Hepatitis B test. Screening  Lung cancer screening. You may have this screening every year starting at age 30 if you have a 30-pack-year history of smoking and currently smoke or have quit within the past 15 years.  Prostate cancer screening. Recommendations will vary depending on your family history and other risks.  Colorectal cancer screening. All adults should have this screening starting at age 59 and continuing until age 9. Your health care provider may recommend screening at age 104 if you are at increased risk. You will have tests every 1-10 years, depending on your results and the type of screening test.  Diabetes screening. This is done by checking your blood sugar (glucose) after you have not eaten  for a while (fasting). You may have this done every 1-3 years.  Sexually transmitted disease (STD) testing. Follow these instructions at home: Eating and drinking  Eat a diet that includes fresh fruits and vegetables, whole grains, lean protein, and low-fat dairy products.  Take vitamin and mineral supplements as  recommended by your health care provider.  Do not drink alcohol if your health care provider tells you not to drink.  If you drink alcohol: ? Limit how much you have to 0-2 drinks a day. ? Be aware of how much alcohol is in your drink. In the [place] one drink equals one 12 oz bottle of beer (355 mL), one 5 oz glass of wine (148 mL), or one 1 oz glass of hard liquor (44 mL). Lifestyle  Take daily care of your teeth and gums.  Stay active. Exercise for at least 30 minutes on 5 or more days each week.  Do not use any products that contain nicotine or tobacco, such as cigarettes, e-cigarettes, and chewing tobacco. If you need help quitting, ask your health care provider.  If you are sexually active, practice safe sex. Use a condom or other form of protection to prevent STIs (sexually transmitted infections).  Talk with your health care provider about taking a low-dose aspirin every day starting at age 80. What's next?  Go to your health care provider once a year for a well check visit.  Ask your health care provider how often you should have your eyes and teeth checked.  Stay up to date on all vaccines. This information is not intended to replace advice given to you by your health care provider. Make sure you discuss any questions you have with your health care provider. Document Revised: 01/14/2018 Document Reviewed: 01/14/2018 Elsevier Patient Education  2020 [employer].

## 2019-04-08 NOTE — Assessment & Plan Note (Signed)
Stable

## 2019-04-08 NOTE — Assessment & Plan Note (Signed)
Check labs 

## 2019-04-08 NOTE — Assessment & Plan Note (Signed)
Derm referral.

## 2019-04-08 NOTE — Assessment & Plan Note (Signed)
Tolerating statin, encouraged heart healthy diet, avoid trans fats, minimize simple carbs and saturated fats. Increase exercise as tolerated 

## 2019-04-08 NOTE — Assessment & Plan Note (Signed)
US thyroid pending

## 2019-04-08 NOTE — Assessment & Plan Note (Signed)
ghm utd Check labs See AVS 

## 2019-04-08 NOTE — Assessment & Plan Note (Signed)
Per u rology 

## 2019-04-08 NOTE — Assessment & Plan Note (Signed)
Stable. Refill meds

## 2019-04-08 NOTE — Progress Notes (Signed)
Patient ID: [name], Dr., male    DOB: [birth year]  Age: 59 y.o. MRN: [id number]    Subjective:  Subjective  HPI [name], Dr. presents for cpe and labs   Review of Systems  Constitutional: Negative for appetite change, diaphoresis, fatigue, fever and unexpected weight change.  HENT: Negative for congestion.   Eyes: Negative for pain, redness and visual disturbance.  Respiratory: Negative for cough, chest tightness, shortness of breath and wheezing.   Cardiovascular: Negative for chest pain, palpitations and leg swelling.  Gastrointestinal: Negative for vomiting.  Endocrine: Negative for cold intolerance, heat intolerance, polydipsia, polyphagia and polyuria.  Genitourinary: Negative for difficulty urinating, dysuria and frequency.  Musculoskeletal: Negative for back pain.  Skin: Negative for rash.  Neurological: Negative for dizziness, light-headedness, numbness and headaches.    History Past Medical History:  Diagnosis Date  . BPH (benign prostatic hypertrophy)   . GERD (gastroesophageal reflux disease)   . H/O hiatal hernia   . History of colon polyps   . Hyperlipidemia   . Hypertension   . Knee internal derangement    RIGHT  . Mild asthma   . Vitamin D deficiency   . Wears glasses     He has a past surgical history that includes Lumbar disc surgery (04-02-2007); Knee arthroscopy (Right, X4  last one 2005); Parathyroidectomy (1989); Esophagogastroduodenoscopy (11-15-2008); Colonoscopy w/ polypectomy (01-21-2008); NEGATIVE SLEEP STUDY (per pt); and Knee arthroscopy (Right, 12/06/2013).   His family history includes Colon cancer in his father; Coronary artery disease in his mother; Hyperlipidemia in his brother and mother; Pancreatic cancer in his brother; Pulmonary embolism in his father.He reports that he has never smoked. He has never used smokeless tobacco. He reports current alcohol use. He reports that he does not use drugs.  Current Outpatient Medications  on File Prior to Visit  Medication Sig Dispense Refill  . albuterol (VENTOLIN HFA) 108 (90 Base) MCG/ACT inhaler Inhale 2 puffs into the lungs every 6 (six) hours as needed for wheezing or shortness of breath. 1 Inhaler 0  . ALBUTEROL IN Inhale into the lungs as needed.    . Albuterol Sulfate 2.5 MG/0.5ML NEBU Inhale into the lungs. 1 nebule every 6 hours as needed    . atorvastatin (LIPITOR) 40 MG tablet Take 1 tablet (40 mg total) by mouth every morning. 90 tablet 3  . BYSTOLIC 10 MG tablet TAKE 1 TABLET DAILY 90 tablet 3  . Cholecalciferol (VITAMIN D) 50 MCG (2000 UT) tablet Take 2,000 Units by mouth daily.    . Coenzyme Q10 (CO Q 10 PO) Take 1 tablet by mouth daily.    . fluticasone furoate-vilanterol (BREO ELLIPTA) 100-25 MCG/INH AEPB Inhale 1 puff into the lungs daily. 180 each 3  . Multiple Vitamin (MULTIVITAMIN) tablet Take 1 tablet by mouth every morning.     . olmesartan-hydrochlorothiazide (BENICAR HCT) 40-12.5 MG tablet Take 1 tablet by mouth daily. 90 tablet 3  . silodosin (RAPAFLO) 8 MG CAPS capsule Take 8 mg by mouth at bedtime.    . solifenacin (VESICARE) 5 MG tablet Take 1 tablet (5 mg total) by mouth daily. 90 tablet 4  . pantoprazole (PROTONIX) 40 MG tablet TAKE 1 TABLET BY MOUTH ONCE DAILY (Patient not taking: Reported on 04/08/2019) 90 tablet 3   No current facility-administered medications on file prior to visit.     Objective:  Objective  Physical Exam Vitals and nursing note reviewed.  Constitutional:      General: He is sleeping. He  is not in acute distress.    Appearance: He is well-developed. He is not diaphoretic.  HENT:     Head: Normocephalic and atraumatic.     Right Ear: External ear normal.     Left Ear: External ear normal.  Eyes:     General:        Right eye: No discharge.        Left eye: No discharge.     Conjunctiva/sclera: Conjunctivae normal.     Pupils: Pupils are equal, round, and reactive to light.  Neck:     Thyroid: No thyromegaly.      Vascular: No JVD.  Cardiovascular:     Rate and Rhythm: Normal rate and regular rhythm.     Heart sounds: Normal heart sounds. No murmur.  Pulmonary:     Effort: Pulmonary effort is normal. No respiratory distress.     Breath sounds: Normal breath sounds. No wheezing or rales.  Chest:     Chest wall: No tenderness.  Abdominal:     General: Bowel sounds are normal. There is no distension.     Palpations: Abdomen is soft. There is no mass.     Tenderness: There is no abdominal tenderness. There is no guarding or rebound.  Genitourinary:    Comments: Per urology Musculoskeletal:        General: No tenderness. Normal range of motion.     Cervical back: Normal range of motion and neck supple.  Lymphadenopathy:     Cervical: No cervical adenopathy.  Skin:    General: Skin is warm and dry.     Findings: No erythema or rash.  Neurological:     Mental Status: He is oriented to person, place, and time.     Cranial Nerves: No cranial nerve deficit.     Motor: No abnormal muscle tone.     Deep Tendon Reflexes: Reflexes are normal and symmetric. Reflexes normal.  Psychiatric:        Behavior: Behavior normal.        Thought Content: Thought content normal.        Judgment: Judgment normal.    BP 108/70 (BP Location: Right Arm, Patient Position: Sitting, Cuff Size: Normal)   Pulse 66   Temp 97.8 F (36.6 C) (Temporal)   Resp 18   Ht 6\' 2"  (1.88 m)   Wt 203 lb 9.6 oz (92.4 kg)   SpO2 98%   BMI 26.14 kg/m  Wt Readings from Last 3 Encounters:  04/08/19 203 lb 9.6 oz (92.4 kg)  06/24/18 195 lb (88.5 kg)  12/06/13 211 lb (95.7 kg)     Lab Results  Component Value Date   WBC 5.9 07/30/2018   HGB 14.5 07/30/2018   HCT 43.3 07/30/2018   PLT 277.0 07/30/2018   GLUCOSE 106 (H) 07/30/2018   CHOL 190 07/30/2018   TRIG 124.0 07/30/2018   HDL 45.50 07/30/2018   LDLDIRECT 136.7 06/09/2007   LDLCALC 120 (H) 07/30/2018   ALT 35 07/30/2018   AST 23 07/30/2018   NA 139 07/30/2018   K  3.9 07/30/2018   CL 101 07/30/2018   CREATININE 1.12 07/30/2018   BUN 21 07/30/2018   CO2 29 07/30/2018   TSH 1.89 07/30/2018   PSA 4.25 (H) 07/30/2018   INR 1.0 12/09/2006   HGBA1C  12/09/2006    5.6 (NOTE)   The ADA recommends the following therapeutic goals for glycemic   control related to Hgb A1C measurement:   Goal of Therapy:   <  7.0% Hgb A1C   Action Suggested:  > 8.0% Hgb A1C   Ref:  Diabetes Care, 22, Suppl. 1, 1999    US Carotid Bilateral  Result Date: 10/18/2018 CLINICAL DATA:  59 year old male with a history of carotid artery disease EXAM: BILATERAL CAROTID DUPLEX ULTRASOUND TECHNIQUE: Pearline Cables scale imaging, color Doppler and duplex ultrasound were performed of bilateral carotid and vertebral arteries in the neck. COMPARISON:  None. FINDINGS: Criteria: Quantification of carotid stenosis is based on velocity parameters that correlate the residual internal carotid diameter with NASCET-based stenosis levels, using the diameter of the distal internal carotid lumen as the denominator for stenosis measurement. The following velocity measurements were obtained: RIGHT ICA:  Systolic 78 cm/sec, Diastolic 29 cm/sec CCA:  0000000 cm/sec SYSTOLIC ICA/CCA RATIO:  0.6 ECA:  103 cm/sec LEFT ICA:  Systolic 80 cm/sec, Diastolic 31 cm/sec CCA:  [id number] cm/sec SYSTOLIC ICA/CCA RATIO:  0.6 ECA:  103 cm/sec Right Brachial SBP: 144 Left Brachial SBP: 135 RIGHT CAROTID ARTERY: No significant calcifications of the right common carotid artery. Intermediate waveform maintained. Heterogeneous and partially calcified plaque at the right carotid bifurcation. No significant lumen shadowing. Low resistance waveform of the right ICA. No significant tortuosity. RIGHT VERTEBRAL ARTERY: Antegrade flow with low resistance waveform. LEFT CAROTID ARTERY: No significant calcifications of the left common carotid artery. Intermediate waveform maintained. Heterogeneous and partially calcified plaque at the left carotid bifurcation without  significant lumen shadowing. Low resistance waveform of the left ICA. No significant tortuosity. LEFT VERTEBRAL ARTERY:  Antegrade flow with low resistance waveform. Other: Incidental right thyroid nodule measures 8 mm. IMPRESSION: Color duplex indicates minimal heterogeneous and calcified plaque, with no hemodynamically significant stenosis by duplex criteria in the extracranial cerebrovascular circulation. Incidental right thyroid nodule. Dedicated thyroid ultrasound may be be useful. Signed, Dulcy Fanny. Dellia Nims, RPVI [facility] Radiology Electronically Signed   By: Corrie Mckusick D.O.   On: 10/18/2018 11:19     Assessment & Plan:  Plan  I have changed [name], Dr.'s omeprazole. I am also having him maintain his silodosin, multivitamin, ALBUTEROL IN, pantoprazole, solifenacin, Bystolic, Albuterol Sulfate, Coenzyme Q10 (CO Q 10 PO), Vitamin D, albuterol, atorvastatin, fluticasone furoate-vilanterol, and olmesartan-hydrochlorothiazide.  Meds ordered this encounter  Medications  . omeprazole (PRILOSEC) 40 MG capsule    Sig: Take 1 capsule (40 mg total) by mouth daily.    Dispense:  90 capsule    Refill:  3    Problem List Items Addressed This Visit      Unprioritized   Essential hypertension    Well controlled, no changes to meds. Encouraged heart healthy diet such as the DASH diet and exercise as tolerated.        H/O gastroesophageal reflux (GERD)    Stable Refill meds      Hyperlipidemia    Tolerating statin, encouraged heart healthy diet, avoid trans fats, minimize simple carbs and saturated fats. Increase exercise as tolerated      Relevant Orders   Lipid panel   Comprehensive metabolic panel   Preventative health care - Primary    ghm utd Check labs See AVS      Relevant Orders   Lipid panel   CBC with Differential/Platelet   TSH   Comprehensive metabolic panel   Vitamin D (25 hydroxy)   Prostate cancer [facility])     Per urology      Stenosis of carotid artery    Stable       Suspicious  nevus    Derm referral      Relevant Orders   Ambulatory referral to Dermatology   Thyroid nodule    US thyroid pending       Vitamin D deficiency    Check labs       Relevant Orders   Vitamin D (25 hydroxy)    Other Visit Diagnoses    Gastroesophageal reflux disease, unspecified whether esophagitis present       Relevant Medications   omeprazole (PRILOSEC) 40 MG capsule      Follow-up: Return if symptoms worsen or fail to improve----needs labs scheduled.  Ann Held, DO

## 2019-04-08 NOTE — Assessment & Plan Note (Signed)
Well controlled, no changes to meds. Encouraged heart healthy diet such as the DASH diet and exercise as tolerated.  °

## 2019-04-12 DIAGNOSIS — H2513 Age-related nuclear cataract, bilateral: Secondary | ICD-10-CM | POA: Diagnosis not present

## 2019-04-12 DIAGNOSIS — H33301 Unspecified retinal break, right eye: Secondary | ICD-10-CM | POA: Diagnosis not present

## 2019-04-12 DIAGNOSIS — H33311 Horseshoe tear of retina without detachment, right eye: Secondary | ICD-10-CM | POA: Diagnosis not present

## 2019-04-12 DIAGNOSIS — H4311 Vitreous hemorrhage, right eye: Secondary | ICD-10-CM | POA: Diagnosis not present

## 2019-04-12 DIAGNOSIS — H43811 Vitreous degeneration, right eye: Secondary | ICD-10-CM | POA: Diagnosis not present

## 2019-04-12 DIAGNOSIS — H43391 Other vitreous opacities, right eye: Secondary | ICD-10-CM | POA: Diagnosis not present

## 2019-05-20 DIAGNOSIS — C61 Malignant neoplasm of prostate: Secondary | ICD-10-CM | POA: Diagnosis not present

## 2019-05-20 DIAGNOSIS — R350 Frequency of micturition: Secondary | ICD-10-CM | POA: Diagnosis not present

## 2019-05-20 DIAGNOSIS — N401 Enlarged prostate with lower urinary tract symptoms: Secondary | ICD-10-CM | POA: Diagnosis not present

## 2019-05-31 ENCOUNTER — Encounter: Payer: Self-pay | Admitting: Family Medicine

## 2019-06-01 DIAGNOSIS — H43391 Other vitreous opacities, right eye: Secondary | ICD-10-CM | POA: Insufficient documentation

## 2019-06-01 DIAGNOSIS — H4311 Vitreous hemorrhage, right eye: Secondary | ICD-10-CM | POA: Insufficient documentation

## 2019-06-01 DIAGNOSIS — H2511 Age-related nuclear cataract, right eye: Secondary | ICD-10-CM | POA: Insufficient documentation

## 2019-06-01 DIAGNOSIS — H33301 Unspecified retinal break, right eye: Secondary | ICD-10-CM | POA: Insufficient documentation

## 2019-06-01 DIAGNOSIS — H33311 Horseshoe tear of retina without detachment, right eye: Secondary | ICD-10-CM | POA: Insufficient documentation

## 2019-06-02 ENCOUNTER — Ambulatory Visit (INDEPENDENT_AMBULATORY_CARE_PROVIDER_SITE_OTHER): Payer: Federal, State, Local not specified - PPO | Admitting: Ophthalmology

## 2019-06-02 ENCOUNTER — Encounter (INDEPENDENT_AMBULATORY_CARE_PROVIDER_SITE_OTHER): Payer: Self-pay | Admitting: Ophthalmology

## 2019-06-02 ENCOUNTER — Other Ambulatory Visit: Payer: Self-pay

## 2019-06-02 DIAGNOSIS — H33311 Horseshoe tear of retina without detachment, right eye: Secondary | ICD-10-CM

## 2019-06-02 DIAGNOSIS — H2511 Age-related nuclear cataract, right eye: Secondary | ICD-10-CM

## 2019-06-02 DIAGNOSIS — H33301 Unspecified retinal break, right eye: Secondary | ICD-10-CM

## 2019-06-02 DIAGNOSIS — H4311 Vitreous hemorrhage, right eye: Secondary | ICD-10-CM

## 2019-06-02 DIAGNOSIS — H43391 Other vitreous opacities, right eye: Secondary | ICD-10-CM

## 2019-06-02 NOTE — Progress Notes (Signed)
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  In right eye.  Severity is moderate.  Duration of 7 weeks.  Since onset it is stable.  I, the attending physician,  performed the HPI with the patient and updated documentation appropriately.          Comments    7 Week post op OD FP  Pt states OD vision is still not clear. Pt sees occasional film over OD that comes and goes, states it feels slimy. Pt also sees floaters in OD.       Last edited by Tilda Franco on 06/02/2019  3:36 PM. (History)      Referring physician: [name], [name], DO [address] [place],  [place] [address]  HISTORICAL INFORMATION:   Selected notes from the MEDICAL RECORD NUMBER    Lab Results  Component Value Date   HGBA1C  12/09/2006    5.6 (NOTE)   The ADA recommends the following therapeutic goals for glycemic   control related to Hgb A1C measurement:   Goal of Therapy:   < 7.0% Hgb A1C   Action Suggested:  > 8.0% Hgb A1C   Ref:  Diabetes Care, 22, Suppl. 1, 1999     CURRENT MEDICATIONS: No current outpatient medications on file. (Ophthalmic Drugs)   No current facility-administered medications for this visit. (Ophthalmic Drugs)   Current Outpatient Medications (Other)  Medication Sig  . albuterol (VENTOLIN HFA) 108 (90 Base) MCG/ACT inhaler Inhale 2 puffs into the lungs every 6 (six) hours as needed for wheezing or shortness of breath.  . ALBUTEROL IN Inhale into the lungs as needed.  . Albuterol Sulfate 2.5 MG/0.5ML NEBU Inhale into the lungs. 1 nebule every 6 hours as needed  . atorvastatin (LIPITOR) 40 MG tablet Take 1 tablet (40 mg total) by mouth every morning.  Marland Kitchen BYSTOLIC 10 MG tablet TAKE 1 TABLET DAILY  . Cholecalciferol (VITAMIN D) 50 MCG (2000 UT)  tablet Take 2,000 Units by mouth daily.  . Coenzyme Q10 (CO Q 10 PO) Take 1 tablet by mouth daily.  . fluticasone furoate-vilanterol (BREO ELLIPTA) 100-25 MCG/INH AEPB Inhale 1 puff into the lungs daily.  . Multiple Vitamin (MULTIVITAMIN) tablet Take 1 tablet by mouth every morning.   . olmesartan-hydrochlorothiazide (BENICAR HCT) 40-12.5 MG tablet Take 1 tablet by mouth daily.  Marland Kitchen omeprazole (PRILOSEC) 40 MG capsule Take 1 capsule (40 mg total) by mouth daily.  . pantoprazole (PROTONIX) 40 MG tablet TAKE 1 TABLET BY MOUTH ONCE DAILY (Patient not taking: Reported on 04/08/2019)  . silodosin (RAPAFLO) 8 MG CAPS capsule Take 8 mg by mouth at bedtime.  . solifenacin (VESICARE) 5 MG tablet Take 1 tablet (5 mg total) by mouth daily.   No current facility-administered medications for this visit. (Other)      REVIEW OF SYSTEMS:    ALLERGIES Allergies  Allergen Reactions  . Merthiolate [Thimerosal] Rash    Rash--local    PAST MEDICAL HISTORY Past Medical History:  Diagnosis Date  . BPH (benign prostatic hypertrophy)   . GERD (gastroesophageal reflux disease)   . H/O hiatal hernia   . History of colon polyps   . Hyperlipidemia   . Hypertension   .  Knee internal derangement    RIGHT  . Mild asthma   . Vitamin D deficiency   . Wears glasses    Past Surgical History:  Procedure Laterality Date  . COLONOSCOPY W/ POLYPECTOMY  01-21-2008  . ESOPHAGOGASTRODUODENOSCOPY  11-15-2008  . KNEE ARTHROSCOPY Right X4  last one 2005  . KNEE ARTHROSCOPY Right 12/06/2013   Procedure: ARTHROSCOPY RIGHT KNEE WITH DEBRIDMENT AND PARTIAL MEDIAL AND LATERAL MENISCECTOMY AND CHONDRLPLASTY;  Surgeon: [name], MD;  Location: [facility];  Service: Orthopedics;  Laterality: Right;  . LUMBAR DISC SURGERY  04-02-2007   L5 -- S1  . NEGATIVE SLEEP STUDY  per pt  . PARATHYROIDECTOMY  1989   removal adenoma    FAMILY HISTORY Family History  Problem Relation Age of Onset  .  Hyperlipidemia Mother   . Coronary artery disease Mother   . Pulmonary embolism Father   . Colon cancer Father   . Hyperlipidemia Brother   . Pancreatic cancer Brother     SOCIAL HISTORY Social History   Tobacco Use  . Smoking status: Never Smoker  . Smokeless tobacco: Never Used  Substance Use Topics  . Alcohol use: Yes    Alcohol/week: 0.0 standard drinks    Comment: social use  . Drug use: No         OPHTHALMIC EXAM:  Base Eye Exam    Visual Acuity (Snellen - Linear)      Right Left   Dist cc 20/25 -1 20/20   Correction: Glasses       Tonometry (Tonopen, 3:41 PM)      Right Left   Pressure 12 13       Pupils      Pupils Dark Light Shape React APD   Right PERRL 4 2 Round Brisk None   Left PERRL 4 2 Round Brisk None       Visual Fields (Counting fingers)      Left Right    Full Full       Neuro/Psych    Oriented x3: Yes   Mood/Affect: Normal       Dilation    Right eye: 1.0% Mydriacyl, 2.5% Phenylephrine @ 3:41 PM        Slit Lamp and Fundus Exam    External Exam      Right Left   External Normal        Slit Lamp Exam      Right Left   Lids/Lashes Normal Normal   Conjunctiva/Sclera White and quiet White and quiet   Cornea Clear Clear   Anterior Chamber Deep and quiet Deep and quiet   Iris Round and reactive Round and reactive   Lens 1+ Nuclear sclerosis Clear   Anterior Vitreous Normal Normal       Fundus Exam      Right Left   Posterior Vitreous Posterior vitreous detachment    Disc Normal    Periphery Retinal tear, 11:00 with elevated flap.  Good laser retinopexy reaction around the margins of the flap.           IMAGING AND PROCEDURES  Imaging and Procedures for 06/02/19  Color Fundus Photography Optos - OU - Both Eyes       Right Eye Progression has improved. Disc findings include normal observations. Periphery : tear.   Left Eye Progression has been stable. Disc findings include normal observations. Macula : normal  observations. Vessels : normal observations. Periphery : normal observations.   Notes Good laser  pexy superotemporally.  There is vitreous debris which might hamper visual acuity                ASSESSMENT/PLAN:  No problem-specific Assessment & Plan notes found for this encounter.      ICD-10-CM   1. Horseshoe retinal tear of right eye  H33.311 Color Fundus Photography Optos - OU - Both Eyes  2. Vitreous opacities of right eye  H43.391   3. Retinal break of right eye  H33.301   4. Vitreous hemorrhage of right eye ([place])  H43.11   5. Nuclear sclerotic cataract of right eye  H25.11     1.  OD, well treated laser tear.  Vitreous debris does have some impact on visual acuity and functioning.  There is no new retinal breaks  2.  Follow up as needed new symptomatology.  3.  I do not recommend inversion activities, workout or inversion chairs  Ophthalmic Meds Ordered this visit:  No orders of the defined types were placed in this encounter.      Return in about 3 months (around 09/01/2019) for OD.  There are no Patient Instructions on file for this visit.   Explained the diagnoses, plan, and follow up with the patient and they expressed understanding.  Patient expressed understanding of the importance of proper follow up care.   Clent Demark Pollyann Roa M.D. Diseases & Surgery of the Retina and Vitreous Retina & Diabetic [place] 06/02/19     Abbreviations: M myopia (nearsighted); A astigmatism; H hyperopia (farsighted); P presbyopia; Mrx spectacle prescription;  CTL contact lenses; OD right eye; OS left eye; OU both eyes  XT exotropia; ET esotropia; PEK punctate epithelial keratitis; PEE punctate epithelial erosions; DES dry eye syndrome; MGD meibomian gland dysfunction; ATs artificial tears; PFAT's preservative free artificial tears; [place] nuclear sclerotic cataract; PSC posterior subcapsular cataract; ERM epi-retinal membrane; PVD posterior vitreous detachment; RD retinal  detachment; DM diabetes mellitus; DR diabetic retinopathy; NPDR non-proliferative diabetic retinopathy; PDR proliferative diabetic retinopathy; CSME clinically significant macular edema; DME diabetic macular edema; dbh dot blot hemorrhages; CWS cotton wool spot; POAG primary open angle glaucoma; C/D cup-to-disc ratio; HVF humphrey visual field; GVF goldmann visual field; OCT optical coherence tomography; IOP intraocular pressure; BRVO Branch retinal vein occlusion; CRVO central retinal vein occlusion; CRAO central retinal artery occlusion; BRAO branch retinal artery occlusion; RT retinal tear; SB scleral buckle; PPV pars plana vitrectomy; VH Vitreous hemorrhage; PRP panretinal laser photocoagulation; IVK intravitreal kenalog; VMT vitreomacular traction; MH Macular hole;  NVD neovascularization of the disc; NVE neovascularization elsewhere; AREDS age related eye disease study; ARMD age related macular degeneration; POAG primary open angle glaucoma; EBMD epithelial/anterior basement membrane dystrophy; ACIOL anterior chamber intraocular lens; IOL intraocular lens; PCIOL posterior chamber intraocular lens; Phaco/IOL phacoemulsification with intraocular lens placement; [place] photorefractive keratectomy; LASIK laser assisted in situ keratomileusis; HTN hypertension; DM diabetes mellitus; COPD chronic obstructive pulmonary disease

## 2019-06-07 MED ORDER — NEBIVOLOL HCL 10 MG PO TABS: 10.0000 mg | ORAL_TABLET | Freq: Every day | ORAL | 3 refills | Status: DC

## 2019-06-09 ENCOUNTER — Other Ambulatory Visit: Payer: Self-pay | Admitting: Urology

## 2019-06-09 DIAGNOSIS — C61 Malignant neoplasm of prostate: Secondary | ICD-10-CM

## 2019-06-17 ENCOUNTER — Other Ambulatory Visit (INDEPENDENT_AMBULATORY_CARE_PROVIDER_SITE_OTHER): Payer: Federal, State, Local not specified - PPO

## 2019-06-17 DIAGNOSIS — Z0000 Encounter for general adult medical examination without abnormal findings: Secondary | ICD-10-CM

## 2019-06-17 DIAGNOSIS — E785 Hyperlipidemia, unspecified: Secondary | ICD-10-CM

## 2019-06-17 DIAGNOSIS — E559 Vitamin D deficiency, unspecified: Secondary | ICD-10-CM | POA: Diagnosis not present

## 2019-06-17 LAB — LIPID PANEL
Cholesterol: 194 mg/dL (ref 0–200)
HDL: 40.9 mg/dL (ref >39.00)
LDL Cholesterol: 126 mg/dL — ABNORMAL HIGH (ref 0–99)
NonHDL: 152.96
Total CHOL/HDL Ratio: 5
Triglycerides: 134 mg/dL (ref 0.0–149.0)
VLDL: 26.8 mg/dL (ref 0.0–40.0)

## 2019-06-17 LAB — COMPREHENSIVE METABOLIC PANEL
ALT: 34 U/L (ref 0–53)
AST: 25 U/L (ref 0–37)
Albumin: 4.3 g/dL (ref 3.5–5.2)
Alkaline Phosphatase: 61 U/L (ref 39–117)
BUN: 17 mg/dL (ref 6–23)
CO2: 29 mEq/L (ref 19–32)
Calcium: 9.4 mg/dL (ref 8.4–10.5)
Chloride: 102 mEq/L (ref 96–112)
Creatinine, Ser: 1.15 mg/dL (ref 0.40–1.50)
GFR: 64.69 mL/min (ref >60.00)
Glucose, Bld: 108 mg/dL — ABNORMAL HIGH (ref 70–99)
Potassium: 3.7 mEq/L (ref 3.5–5.1)
Sodium: 139 mEq/L (ref 135–145)
Total Bilirubin: 0.5 mg/dL (ref 0.2–1.2)
Total Protein: 7 g/dL (ref 6.0–8.3)

## 2019-06-17 LAB — CBC WITH DIFFERENTIAL/PLATELET
Basophils Absolute: 0 10*3/uL (ref 0.0–0.1)
Basophils Relative: 0.8 % (ref 0.0–3.0)
Eosinophils Absolute: 0.5 10*3/uL (ref 0.0–0.7)
Eosinophils Relative: 8.1 % — ABNORMAL HIGH (ref 0.0–5.0)
HCT: 42.4 % (ref 39.0–52.0)
Hemoglobin: 14.3 g/dL (ref 13.0–17.0)
Lymphocytes Relative: 30.6 % (ref 12.0–46.0)
Lymphs Abs: 1.9 10*3/uL (ref 0.7–4.0)
MCHC: 33.8 g/dL (ref 30.0–36.0)
MCV: 88.6 fl (ref 78.0–100.0)
Monocytes Absolute: 0.7 10*3/uL (ref 0.1–1.0)
Monocytes Relative: 11.4 % (ref 3.0–12.0)
Neutro Abs: 3.1 10*3/uL (ref 1.4–7.7)
Neutrophils Relative %: 49.1 % (ref 43.0–77.0)
Platelets: 250 10*3/uL (ref 150.0–400.0)
RBC: 4.79 Mil/uL (ref 4.22–5.81)
RDW: 14.5 % (ref 11.5–15.5)
WBC: 6.3 10*3/uL (ref 4.0–10.5)

## 2019-06-17 LAB — VITAMIN D 25 HYDROXY (VIT D DEFICIENCY, FRACTURES): VITD: 48.8 ng/mL (ref 30.00–100.00)

## 2019-06-17 LAB — TSH: TSH: 2.59 u[IU]/mL (ref 0.35–4.50)

## 2019-06-21 ENCOUNTER — Ambulatory Visit (INDEPENDENT_AMBULATORY_CARE_PROVIDER_SITE_OTHER): Payer: Federal, State, Local not specified - PPO | Admitting: Ophthalmology

## 2019-06-21 ENCOUNTER — Encounter (INDEPENDENT_AMBULATORY_CARE_PROVIDER_SITE_OTHER): Payer: Self-pay | Admitting: Ophthalmology

## 2019-06-21 ENCOUNTER — Other Ambulatory Visit: Payer: Self-pay

## 2019-06-21 DIAGNOSIS — H33011 Retinal detachment with single break, right eye: Secondary | ICD-10-CM

## 2019-06-21 DIAGNOSIS — H33301 Unspecified retinal break, right eye: Secondary | ICD-10-CM | POA: Diagnosis not present

## 2019-06-21 MED ORDER — PREDNISOLONE ACETATE 1 % OP SUSP
1.0000 [drp] | Freq: Four times a day (QID) | OPHTHALMIC | 0 refills | Status: DC
Start: 2019-06-21 — End: 2019-07-11

## 2019-06-21 MED ORDER — OFLOXACIN 0.3 % OP SOLN
1.0000 [drp] | Freq: Four times a day (QID) | OPHTHALMIC | 0 refills | Status: DC
Start: 2019-06-23 — End: 2020-05-24

## 2019-06-21 NOTE — Progress Notes (Signed)
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  In right eye.  This started 3 days ago.  Since onset it is gradually worsening.          Comments    Patient states on Sunday, he began to have a decrease in vision in his right eye. Patient states that he has also had some pressure in his right eye. Patient states yesterday he noticed he could not read anything on the computer screen, as well as today, when he closed his left eye. Patient states that he also has started to see shadows in his right eye.       Last edited by Gerda Diss on 06/21/2019  3:46 PM. (History)      Referring physician: [name], [name], DO [address] [place],  [place] [address]  HISTORICAL INFORMATION:   Selected notes from the MEDICAL RECORD NUMBER    Lab Results  Component Value Date   HGBA1C  12/09/2006    5.6 (NOTE)   The ADA recommends the following therapeutic goals for glycemic   control related to Hgb A1C measurement:   Goal of Therapy:   < 7.0% Hgb A1C   Action Suggested:  > 8.0% Hgb A1C   Ref:  Diabetes Care, 22, Suppl. 1, 1999     CURRENT MEDICATIONS: No current outpatient medications on file. (Ophthalmic Drugs)   No current facility-administered medications for this visit. (Ophthalmic Drugs)   Current Outpatient Medications (Other)  Medication Sig  . albuterol (VENTOLIN HFA) 108 (90 Base) MCG/ACT inhaler Inhale 2 puffs into the lungs every 6 (six) hours as needed for wheezing or shortness of breath.  . ALBUTEROL IN Inhale into the lungs as needed.  . Albuterol Sulfate 2.5 MG/0.5ML NEBU Inhale into the lungs. 1 nebule every 6 hours as needed  . atorvastatin (LIPITOR) 40 MG tablet Take 1 tablet (40 mg total) by mouth every morning.  . Cholecalciferol (VITAMIN D) 50 MCG (2000  UT) tablet Take 2,000 Units by mouth daily.  . Coenzyme Q10 (CO Q 10 PO) Take 1 tablet by mouth daily.  . fluticasone furoate-vilanterol (BREO ELLIPTA) 100-25 MCG/INH AEPB Inhale 1 puff into the lungs daily.  . Multiple Vitamin (MULTIVITAMIN) tablet Take 1 tablet by mouth every morning.   . nebivolol (BYSTOLIC) 10 MG tablet Take 1 tablet (10 mg total) by mouth daily.  Marland Kitchen olmesartan-hydrochlorothiazide (BENICAR HCT) 40-12.5 MG tablet Take 1 tablet by mouth daily.  Marland Kitchen omeprazole (PRILOSEC) 40 MG capsule Take 1 capsule (40 mg total) by mouth daily.  . pantoprazole (PROTONIX) 40 MG tablet TAKE 1 TABLET BY MOUTH ONCE DAILY (Patient not taking: Reported on 04/08/2019)  . silodosin (RAPAFLO) 8 MG CAPS capsule Take 8 mg by mouth at bedtime.  . solifenacin (VESICARE) 5 MG tablet Take 1 tablet (5 mg total) by mouth daily.   No current facility-administered medications for this visit. (Other)      REVIEW OF SYSTEMS:    ALLERGIES Allergies  Allergen Reactions  . Merthiolate [Thimerosal] Rash    Rash--local    PAST MEDICAL HISTORY Past Medical History:  Diagnosis Date  . BPH (benign prostatic hypertrophy)   . GERD (gastroesophageal reflux disease)   . H/O  hiatal hernia   . History of colon polyps   . Hyperlipidemia   . Hypertension   . Knee internal derangement    RIGHT  . Mild asthma   . Vitamin D deficiency   . Wears glasses    Past Surgical History:  Procedure Laterality Date  . COLONOSCOPY W/ POLYPECTOMY  01-21-2008  . ESOPHAGOGASTRODUODENOSCOPY  11-15-2008  . KNEE ARTHROSCOPY Right X4  last one 2005  . KNEE ARTHROSCOPY Right 12/06/2013   Procedure: ARTHROSCOPY RIGHT KNEE WITH DEBRIDMENT AND PARTIAL MEDIAL AND LATERAL MENISCECTOMY AND CHONDRLPLASTY;  Surgeon: [name], MD;  Location: [facility];  Service: Orthopedics;  Laterality: Right;  . LUMBAR DISC SURGERY  04-02-2007   L5 -- S1  . NEGATIVE SLEEP STUDY  per pt  . PARATHYROIDECTOMY  1989   removal  adenoma    FAMILY HISTORY Family History  Problem Relation Age of Onset  . Hyperlipidemia Mother   . Coronary artery disease Mother   . Pulmonary embolism Father   . Colon cancer Father   . Hyperlipidemia Brother   . Pancreatic cancer Brother     SOCIAL HISTORY Social History   Tobacco Use  . Smoking status: Never Smoker  . Smokeless tobacco: Never Used  Substance Use Topics  . Alcohol use: Yes    Alcohol/week: 0.0 standard drinks    Comment: social use  . Drug use: No         OPHTHALMIC EXAM:  Base Eye Exam    Visual Acuity (Snellen - Linear)      Right Left   Dist cc 20/40-2 20/25+1   Dist ph cc NI    Correction: Glasses       Tonometry (Tonopen, 3:51 PM)      Right Left   Pressure 17 17       Pupils      Pupils Dark Light Shape React APD   Right PERRL 4 3 Round Brisk None   Left PERRL 4 3 Round Brisk None       Visual Fields (Counting fingers)      Left Right    Full    Restrictions  Partial outer superior temporal deficiency       Extraocular Movement      Right Left    Full Full       Neuro/Psych    Oriented x3: Yes   Mood/Affect: Normal       Dilation    Right eye: 1.0% Mydriacyl, 2.5% Phenylephrine @ 3:51 PM        Slit Lamp and Fundus Exam    External Exam      Right Left   External Normal Normal       Slit Lamp Exam      Right Left   Lids/Lashes Normal Normal   Conjunctiva/Sclera White and quiet White and quiet   Cornea Clear Clear   Anterior Chamber Deep and quiet Deep and quiet   Iris Round and reactive Round and reactive   Lens 1+ Nuclear sclerosis    Anterior Vitreous Normal Normal       Fundus Exam      Right Left   Posterior Vitreous Posterior vitreous detachment, Vitreous syneresis    Disc Normal    C/D Ratio 0.2    Macula Will aspect of macula is detached    Vessels Normal    Periphery Retinal break at 1030, horseshoe tear with good retinopexy new subretinal fluid extending from the 1038 superiorly to  the  11 o'clock position and inferiorly down to the 8:30 position and posteriorly into in a shallow detachment of the macula           IMAGING AND PROCEDURES  Imaging and Procedures for 06/21/19  OCT, Retina - OU - Both Eyes       Right Eye Quality was good. Scan locations included subfoveal.   Left Eye Quality was good. Scan locations included subfoveal.        B-Scan Ultrasound - OD - Right Eye       Findings included vitreous opacities.   Notes Rhegmatogenous retinal detachment of the right eye, macula off, nasal macula is in is in place                ASSESSMENT/PLAN:  Retinal break of right eye The nature of retinal detachment was discussed with the patient as well, as the treatment options which include pneumatic retinopexy, scleral buckling, and vitrectomy scleral buckling.  Possible side effects of the various surgical procedures were discussed with the patient.  Possible complications of surgery were discussed with the patient.  Explained the following success rates are involved with retinal detachment surgeries: 90% rate of reattachment success with primary operation, 85% chance of 2nd operation needed (if Pneumatic isn't done first), 70-75% chance of 3rd operation, & 50% chance of 4th & subsequent surgeries.   Patient understands that ongoing scarring of retina may lead to reoccurrence of retinal tears or detachments.  The patient's questions were answered. An informational brochure was given to the patient.  All the patient's questions were answered.  OD, phakic, retinal tear superotemporally with shallow macular detachment extending from 830 to 11:30 position temporally OD.  We will plan scleral buckle retinal cryopexy likely injection of small intravitreal gas to flatten the macula.  Positioning was reviewed.      ICD-10-CM   1. Retinal detachment of right eye with single break  H33.011 B-Scan Ultrasound - OD - Right Eye  2. Retinal break of right eye  H33.301      1.  Plan scleral buckle with retinal cryopexy right eye within 24 hours under general anesthesia at [facility]  2.  Risk and benefits reviewed  3. Pre-operative instructions and paperwork reviewed. Will send drops eRX as needed ([facility]  @ [employer]).  Ophthalmic Meds Ordered this visit:  No orders of the defined types were placed in this encounter.      Return Scheduled for scleral buckle, retinal cryopexy right eye under general anesthesia, for [facility].  There are no Patient Instructions on file for this visit.   Explained the diagnoses, plan, and follow up with the patient and they expressed understanding.  Patient expressed understanding of the importance of proper follow up care.   Clent Demark Zita Ozimek M.D. Diseases & Surgery of the Retina and Vitreous Retina & Diabetic [place] 06/21/19     Abbreviations: M myopia (nearsighted); A astigmatism; H hyperopia (farsighted); P presbyopia; Mrx spectacle prescription;  CTL contact lenses; OD right eye; OS left eye; OU both eyes  XT exotropia; ET esotropia; PEK punctate epithelial keratitis; PEE punctate epithelial erosions; DES dry eye syndrome; MGD meibomian gland dysfunction; ATs artificial tears; PFAT's preservative free artificial tears; [place] nuclear sclerotic cataract; PSC posterior subcapsular cataract; ERM epi-retinal membrane; PVD posterior vitreous detachment; RD retinal detachment; DM diabetes mellitus; DR diabetic retinopathy; NPDR non-proliferative diabetic retinopathy; PDR proliferative diabetic retinopathy; CSME clinically significant macular edema; DME diabetic macular edema; dbh dot blot hemorrhages; CWS  cotton wool spot; POAG primary open angle glaucoma; C/D cup-to-disc ratio; HVF humphrey visual field; GVF goldmann visual field; OCT optical coherence tomography; IOP intraocular pressure; BRVO Branch retinal vein occlusion; CRVO central retinal vein occlusion; CRAO central retinal artery occlusion; BRAO branch retinal artery  occlusion; RT retinal tear; SB scleral buckle; PPV pars plana vitrectomy; VH Vitreous hemorrhage; PRP panretinal laser photocoagulation; IVK intravitreal kenalog; VMT vitreomacular traction; MH Macular hole;  NVD neovascularization of the disc; NVE neovascularization elsewhere; AREDS age related eye disease study; ARMD age related macular degeneration; POAG primary open angle glaucoma; EBMD epithelial/anterior basement membrane dystrophy; ACIOL anterior chamber intraocular lens; IOL intraocular lens; PCIOL posterior chamber intraocular lens; Phaco/IOL phacoemulsification with intraocular lens placement; [place] photorefractive keratectomy; LASIK laser assisted in situ keratomileusis; HTN hypertension; DM diabetes mellitus; COPD chronic obstructive pulmonary disease

## 2019-06-21 NOTE — Assessment & Plan Note (Signed)
The nature of retinal detachment was discussed with the patient as well, as the treatment options which include pneumatic retinopexy, scleral buckling, and vitrectomy scleral buckling.  Possible side effects of the various surgical procedures were discussed with the patient.  Possible complications of surgery were discussed with the patient.  Explained the following success rates are involved with retinal detachment surgeries: 90% rate of reattachment success with primary operation, 85% chance of 2nd operation needed (if Pneumatic isn't done first), 70-75% chance of 3rd operation, & 50% chance of 4th & subsequent surgeries.   Patient understands that ongoing scarring of retina may lead to reoccurrence of retinal tears or detachments.  The patient's questions were answered. An informational brochure was given to the patient.  All the patient's questions were answered.  OD, phakic, retinal tear superotemporally with shallow macular detachment extending from 830 to 11:30 position temporally OD.  We will plan scleral buckle retinal cryopexy likely injection of small intravitreal gas to flatten the macula.  Positioning was reviewed.

## 2019-06-22 ENCOUNTER — Encounter (INDEPENDENT_AMBULATORY_CARE_PROVIDER_SITE_OTHER): Payer: Federal, State, Local not specified - PPO | Admitting: Ophthalmology

## 2019-06-22 DIAGNOSIS — H33011 Retinal detachment with single break, right eye: Secondary | ICD-10-CM

## 2019-06-23 ENCOUNTER — Encounter (INDEPENDENT_AMBULATORY_CARE_PROVIDER_SITE_OTHER): Payer: Self-pay | Admitting: Ophthalmology

## 2019-06-23 ENCOUNTER — Ambulatory Visit (INDEPENDENT_AMBULATORY_CARE_PROVIDER_SITE_OTHER): Payer: Federal, State, Local not specified - PPO | Admitting: Ophthalmology

## 2019-06-23 DIAGNOSIS — Z09 Encounter for follow-up examination after completed treatment for conditions other than malignant neoplasm: Secondary | ICD-10-CM

## 2019-06-23 NOTE — Progress Notes (Signed)
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  Discomfort includes itching.  Negative for pain.  Vision is stable.          Comments    1 dya PO OD - S/p Scleral buckle and cryopexy OD Patient states that he had a really good night. Patient states he has some itching.       Last edited by Gerda Diss on 06/23/2019 11:04 AM. (History)      Referring physician: [name], [name], DO [address] [place],  [place] [address]  HISTORICAL INFORMATION:   Selected notes from the MEDICAL RECORD NUMBER    Lab Results  Component Value Date   HGBA1C  12/09/2006    5.6 (NOTE)   The ADA recommends the following therapeutic goals for glycemic   control related to Hgb A1C measurement:   Goal of Therapy:   < 7.0% Hgb A1C   Action Suggested:  > 8.0% Hgb A1C   Ref:  Diabetes Care, 22, Suppl. 1, 1999     CURRENT MEDICATIONS: Current Outpatient Medications (Ophthalmic Drugs)  Medication Sig  . ofloxacin (OCUFLOX) 0.3 % ophthalmic solution Place 1 drop into the right eye 4 (four) times daily.  . prednisoLONE acetate (PRED FORTE) 1 % ophthalmic suspension Place 1 drop into the right eye 4 (four) times daily.   No current facility-administered medications for this visit. (Ophthalmic Drugs)   Current Outpatient Medications (Other)  Medication Sig  . albuterol (VENTOLIN HFA) 108 (90 Base) MCG/ACT inhaler Inhale 2 puffs into the lungs every 6 (six) hours as needed for wheezing or shortness of breath.  . ALBUTEROL IN Inhale into the lungs as needed.  . Albuterol Sulfate 2.5 MG/0.5ML NEBU Inhale into the lungs. 1 nebule every 6 hours as needed  . atorvastatin (LIPITOR) 40 MG tablet Take 1 tablet (40 mg total) by mouth every morning.  . Cholecalciferol (VITAMIN D) 50 MCG (2000 UT) tablet Take 2,000  Units by mouth daily.  . Coenzyme Q10 (CO Q 10 PO) Take 1 tablet by mouth daily.  . fluticasone furoate-vilanterol (BREO ELLIPTA) 100-25 MCG/INH AEPB Inhale 1 puff into the lungs daily.  . Multiple Vitamin (MULTIVITAMIN) tablet Take 1 tablet by mouth every morning.   . nebivolol (BYSTOLIC) 10 MG tablet Take 1 tablet (10 mg total) by mouth daily.  Marland Kitchen olmesartan-hydrochlorothiazide (BENICAR HCT) 40-12.5 MG tablet Take 1 tablet by mouth daily.  Marland Kitchen omeprazole (PRILOSEC) 40 MG capsule Take 1 capsule (40 mg total) by mouth daily.  . pantoprazole (PROTONIX) 40 MG tablet TAKE 1 TABLET BY MOUTH ONCE DAILY (Patient not taking: Reported on 04/08/2019)  . silodosin (RAPAFLO) 8 MG CAPS capsule Take 8 mg by mouth at bedtime.  . solifenacin (VESICARE) 5 MG tablet Take 1 tablet (5 mg total) by mouth daily.   No current facility-administered medications for this visit. (Other)      REVIEW OF SYSTEMS:    ALLERGIES Allergies  Allergen Reactions  . Merthiolate [Thimerosal] Rash    Rash--local    PAST MEDICAL HISTORY Past Medical History:  Diagnosis Date  . BPH (benign prostatic hypertrophy)   . GERD (gastroesophageal reflux disease)   . H/O hiatal hernia   . History of colon polyps   .  Hyperlipidemia   . Hypertension   . Knee internal derangement    RIGHT  . Mild asthma   . Vitamin D deficiency   . Wears glasses    Past Surgical History:  Procedure Laterality Date  . COLONOSCOPY W/ POLYPECTOMY  01-21-2008  . ESOPHAGOGASTRODUODENOSCOPY  11-15-2008  . KNEE ARTHROSCOPY Right X4  last one 2005  . KNEE ARTHROSCOPY Right 12/06/2013   Procedure: ARTHROSCOPY RIGHT KNEE WITH DEBRIDMENT AND PARTIAL MEDIAL AND LATERAL MENISCECTOMY AND CHONDRLPLASTY;  Surgeon: [name], MD;  Location: [facility];  Service: Orthopedics;  Laterality: Right;  . LUMBAR DISC SURGERY  04-02-2007   L5 -- S1  . NEGATIVE SLEEP STUDY  per pt  . PARATHYROIDECTOMY  1989   removal adenoma    FAMILY  HISTORY Family History  Problem Relation Age of Onset  . Hyperlipidemia Mother   . Coronary artery disease Mother   . Pulmonary embolism Father   . Colon cancer Father   . Hyperlipidemia Brother   . Pancreatic cancer Brother     SOCIAL HISTORY Social History   Tobacco Use  . Smoking status: Never Smoker  . Smokeless tobacco: Never Used  Substance Use Topics  . Alcohol use: Yes    Alcohol/week: 0.0 standard drinks    Comment: social use  . Drug use: No         OPHTHALMIC EXAM:  Base Eye Exam    Visual Acuity (Snellen - Linear)      Right Left   Dist [place] CF @ 1' 20/80-2       Tonometry (Tonopen, 11:15 AM)      Right Left   Pressure 20 14       Dilation    Right eye: 1.0% Mydriacyl, 2.5% Phenylephrine @ 11:15 AM        Slit Lamp and Fundus Exam    External Exam      Right Left   External Normal Normal       Slit Lamp Exam      Right Left   Lids/Lashes Normal Normal   Conjunctiva/Sclera White and quiet White and quiet   Cornea Clear Clear   Anterior Chamber Deep and quiet Deep and quiet   Iris Round and reactive Round and reactive   Lens 2+ Nuclear sclerosis    Anterior Vitreous Normal Normal       Fundus Exam      Right Left   Posterior Vitreous Clear,    Disc Normal    C/D Ratio 0.2    Macula Subretinal fluid present still    Vessels Normal    Periphery Good cryopexy, retina still with fluid subretinal in the macular region.  Excellent support large temporal buckle and  360 encircling band           IMAGING AND PROCEDURES  Imaging and Procedures for 06/23/19           ASSESSMENT/PLAN:  No problem-specific Assessment & Plan notes found for this encounter.    No diagnosis found.  1.  OD, will start topical medications Ocuflox and 4 times daily as well as Pred forte 4 times daily.  2.  Vision hearing reviewed and we will use a stimulator technique from facedown positioning and moving slowly upward with a tilted head and marking on  his forehead to signify the 12 o'clock position for adequate positioning of the internal tamponade gas, C3F8  3.  Ophthalmic Meds Ordered this visit:  No orders of the defined  types were placed in this encounter.      Return in about 1 day (around 06/24/2019), or Visit here at 230 tomorrow afternoon OD, for POST OP, OD.  There are no Patient Instructions on file for this visit.   Explained the diagnoses, plan, and follow up with the patient and they expressed understanding.  Patient expressed understanding of the importance of proper follow up care.   Clent Demark Carnie Bruemmer M.D. Diseases & Surgery of the Retina and Vitreous Retina & Diabetic [place] 06/23/19     Abbreviations: M myopia (nearsighted); A astigmatism; H hyperopia (farsighted); P presbyopia; Mrx spectacle prescription;  CTL contact lenses; OD right eye; OS left eye; OU both eyes  XT exotropia; ET esotropia; PEK punctate epithelial keratitis; PEE punctate epithelial erosions; DES dry eye syndrome; MGD meibomian gland dysfunction; ATs artificial tears; PFAT's preservative free artificial tears; [place] nuclear sclerotic cataract; PSC posterior subcapsular cataract; ERM epi-retinal membrane; PVD posterior vitreous detachment; RD retinal detachment; DM diabetes mellitus; DR diabetic retinopathy; NPDR non-proliferative diabetic retinopathy; PDR proliferative diabetic retinopathy; CSME clinically significant macular edema; DME diabetic macular edema; dbh dot blot hemorrhages; CWS cotton wool spot; POAG primary open angle glaucoma; C/D cup-to-disc ratio; HVF humphrey visual field; GVF goldmann visual field; OCT optical coherence tomography; IOP intraocular pressure; BRVO Branch retinal vein occlusion; CRVO central retinal vein occlusion; CRAO central retinal artery occlusion; BRAO branch retinal artery occlusion; RT retinal tear; SB scleral buckle; PPV pars plana vitrectomy; VH Vitreous hemorrhage; PRP panretinal laser photocoagulation; IVK  intravitreal kenalog; VMT vitreomacular traction; MH Macular hole;  NVD neovascularization of the disc; NVE neovascularization elsewhere; AREDS age related eye disease study; ARMD age related macular degeneration; POAG primary open angle glaucoma; EBMD epithelial/anterior basement membrane dystrophy; ACIOL anterior chamber intraocular lens; IOL intraocular lens; PCIOL posterior chamber intraocular lens; Phaco/IOL phacoemulsification with intraocular lens placement; [place] photorefractive keratectomy; LASIK laser assisted in situ keratomileusis; HTN hypertension; DM diabetes mellitus; COPD chronic obstructive pulmonary disease

## 2019-06-24 ENCOUNTER — Ambulatory Visit (INDEPENDENT_AMBULATORY_CARE_PROVIDER_SITE_OTHER): Payer: Federal, State, Local not specified - PPO | Admitting: Ophthalmology

## 2019-06-24 ENCOUNTER — Ambulatory Visit (INDEPENDENT_AMBULATORY_CARE_PROVIDER_SITE_OTHER): Payer: Self-pay | Admitting: Ophthalmology

## 2019-06-24 DIAGNOSIS — H33011 Retinal detachment with single break, right eye: Secondary | ICD-10-CM

## 2019-06-24 DIAGNOSIS — Z09 Encounter for follow-up examination after completed treatment for conditions other than malignant neoplasm: Secondary | ICD-10-CM

## 2019-06-24 NOTE — Progress Notes (Signed)
06/24/2019     CHIEF COMPLAINT Patient presents for No chief complaint on file.   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  Complaints of pain.  Compliance with facedown positioning followed by Left side sleeping has been good.   Last edited by Hurman Horn, MD on 06/24/2019  3:09 PM. (History)      Referring physician: No referring provider defined for this encounter.  HISTORICAL INFORMATION:   Selected notes from the MEDICAL RECORD NUMBER    Lab Results  Component Value Date   HGBA1C  12/09/2006    5.6 (NOTE)   The ADA recommends the following therapeutic goals for glycemic   control related to Hgb A1C measurement:   Goal of Therapy:   < 7.0% Hgb A1C   Action Suggested:  > 8.0% Hgb A1C   Ref:  Diabetes Care, 22, Suppl. 1, 1999     CURRENT MEDICATIONS: Current Outpatient Medications (Ophthalmic Drugs)  Medication Sig  . ofloxacin (OCUFLOX) 0.3 % ophthalmic solution Place 1 drop into the right eye 4 (four) times daily.  . prednisoLONE acetate (PRED FORTE) 1 % ophthalmic suspension Place 1 drop into the right eye 4 (four) times daily.   No current facility-administered medications for this visit. (Ophthalmic Drugs)   Current Outpatient Medications (Other)  Medication Sig  . albuterol (VENTOLIN HFA) 108 (90 Base) MCG/ACT inhaler Inhale 2 puffs into the lungs every 6 (six) hours as needed for wheezing or shortness of breath.  . ALBUTEROL IN Inhale into the lungs as needed.  . Albuterol Sulfate 2.5 MG/0.5ML NEBU Inhale into the lungs. 1 nebule every 6 hours as needed  . atorvastatin (LIPITOR) 40 MG tablet Take 1 tablet (40 mg total) by mouth every morning.  . Cholecalciferol (VITAMIN D) 50 MCG (2000 UT) tablet Take 2,000 Units by mouth daily.  . Coenzyme Q10 (CO Q 10 PO) Take 1 tablet by mouth daily.  .  fluticasone furoate-vilanterol (BREO ELLIPTA) 100-25 MCG/INH AEPB Inhale 1 puff into the lungs daily.  . Multiple Vitamin (MULTIVITAMIN) tablet Take 1 tablet by mouth every morning.   . nebivolol (BYSTOLIC) 10 MG tablet Take 1 tablet (10 mg total) by mouth daily.  Marland Kitchen olmesartan-hydrochlorothiazide (BENICAR HCT) 40-12.5 MG tablet Take 1 tablet by mouth daily.  Marland Kitchen omeprazole (PRILOSEC) 40 MG capsule Take 1 capsule (40 mg total) by mouth daily.  . pantoprazole (PROTONIX) 40 MG tablet TAKE 1 TABLET BY MOUTH ONCE DAILY (Patient not taking: Reported on 04/08/2019)  . silodosin (RAPAFLO) 8 MG CAPS capsule Take 8 mg by mouth at bedtime.  . solifenacin (VESICARE) 5 MG tablet Take 1 tablet (5 mg total) by mouth daily.   No current facility-administered medications for this visit. (Other)      REVIEW OF SYSTEMS:    ALLERGIES Allergies  Allergen Reactions  . Merthiolate [Thimerosal] Rash    Rash--local    PAST MEDICAL HISTORY Past Medical History:  Diagnosis Date  . BPH (benign prostatic hypertrophy)   . GERD (gastroesophageal reflux disease)   . H/O hiatal hernia   . History of colon polyps   . Hyperlipidemia   . Hypertension   . Knee internal derangement    RIGHT  . Mild asthma   . Vitamin D deficiency   . Wears glasses  Past Surgical History:  Procedure Laterality Date  . COLONOSCOPY W/ POLYPECTOMY  01-21-2008  . ESOPHAGOGASTRODUODENOSCOPY  11-15-2008  . KNEE ARTHROSCOPY Right X4  last one 2005  . KNEE ARTHROSCOPY Right 12/06/2013   Procedure: ARTHROSCOPY RIGHT KNEE WITH DEBRIDMENT AND PARTIAL MEDIAL AND LATERAL MENISCECTOMY AND CHONDRLPLASTY;  Surgeon: [name], MD;  Location: [facility];  Service: Orthopedics;  Laterality: Right;  . LUMBAR DISC SURGERY  04-02-2007   L5 -- S1  . NEGATIVE SLEEP STUDY  per pt  . PARATHYROIDECTOMY  1989   removal adenoma    FAMILY HISTORY Family History  Problem Relation Age of Onset  . Hyperlipidemia Mother   .  Coronary artery disease Mother   . Pulmonary embolism Father   . Colon cancer Father   . Hyperlipidemia Brother   . Pancreatic cancer Brother     SOCIAL HISTORY Social History   Tobacco Use  . Smoking status: Never Smoker  . Smokeless tobacco: Never Used  Substance Use Topics  . Alcohol use: Yes    Alcohol/week: 0.0 standard drinks    Comment: social use  . Drug use: No         OPHTHALMIC EXAM:  Slit Lamp and Fundus Exam    External Exam      Right Left   External Edema, Periorbital edema Normal       Slit Lamp Exam      Right Left   Lids/Lashes Normal Normal   Conjunctiva/Sclera White and quiet White and quiet   Cornea Clear Clear   Anterior Chamber Deep and quiet Deep and quiet   Iris Round and reactive Round and reactive   Lens 2+ Nuclear sclerosis    Anterior Vitreous Normal Normal       Fundus Exam      Right Left   Posterior Vitreous Clear, 10 to 15% gas bubble,, sizable to close the break.    Disc Normal    C/D Ratio 0.2    Macula Much less subretinal fluid present     Vessels Normal    Periphery Good cryopexy, retina still with fluid subretinal in the macular region.  Excellent support large temporal buckle and  360 encircling band, retinal break at 10:00 with good cryo reaction           IMAGING AND PROCEDURES  Imaging and Procedures for 06/24/19           ASSESSMENT/PLAN:  Retinal detachment of right eye with single break Postop day #2, status post scleral buckle, retinal cryopexy and injection of gas, vitreous substitute.  Positioning was reviewed.  The patient's forehead was marked.  We will now plan on positioning only to cover the break.  Sleep on left side down all demonstrated      ICD-10-CM   1. Follow-up examination after eye surgery  Z09   2. Retinal detachment of right eye with single break  H33.011     1.  Continue topical medications ofloxacin and Pred forte each 4 times daily  2.  Daily full-time positioning  demonstrated and sleep left side down at night  3.  Patient or wife may call at any time for questions  Ophthalmic Meds Ordered this visit:  No orders of the defined types were placed in this encounter.      No follow-ups on file.  There are no Patient Instructions on file for this visit.   Explained the diagnoses, plan, and follow up with the patient and they expressed understanding.  Patient expressed understanding of the importance of proper follow up care.   Clent Demark Laquiesha Piacente M.D. Diseases & Surgery of the Retina and Vitreous Retina & Diabetic [place] 06/24/19     Abbreviations: M myopia (nearsighted); A astigmatism; H hyperopia (farsighted); P presbyopia; Mrx spectacle prescription;  CTL contact lenses; OD right eye; OS left eye; OU both eyes  XT exotropia; ET esotropia; PEK punctate epithelial keratitis; PEE punctate epithelial erosions; DES dry eye syndrome; MGD meibomian gland dysfunction; ATs artificial tears; PFAT's preservative free artificial tears; [place] nuclear sclerotic cataract; PSC posterior subcapsular cataract; ERM epi-retinal membrane; PVD posterior vitreous detachment; RD retinal detachment; DM diabetes mellitus; DR diabetic retinopathy; NPDR non-proliferative diabetic retinopathy; PDR proliferative diabetic retinopathy; CSME clinically significant macular edema; DME diabetic macular edema; dbh dot blot hemorrhages; CWS cotton wool spot; POAG primary open angle glaucoma; C/D cup-to-disc ratio; HVF humphrey visual field; GVF goldmann visual field; OCT optical coherence tomography; IOP intraocular pressure; BRVO Branch retinal vein occlusion; CRVO central retinal vein occlusion; CRAO central retinal artery occlusion; BRAO branch retinal artery occlusion; RT retinal tear; SB scleral buckle; PPV pars plana vitrectomy; VH Vitreous hemorrhage; PRP panretinal laser photocoagulation; IVK intravitreal kenalog; VMT vitreomacular traction; MH Macular hole;  NVD neovascularization of  the disc; NVE neovascularization elsewhere; AREDS age related eye disease study; ARMD age related macular degeneration; POAG primary open angle glaucoma; EBMD epithelial/anterior basement membrane dystrophy; ACIOL anterior chamber intraocular lens; IOL intraocular lens; PCIOL posterior chamber intraocular lens; Phaco/IOL phacoemulsification with intraocular lens placement; [place] photorefractive keratectomy; LASIK laser assisted in situ keratomileusis; HTN hypertension; DM diabetes mellitus; COPD chronic obstructive pulmonary disease

## 2019-06-24 NOTE — Assessment & Plan Note (Signed)
Postop day #2, status post scleral buckle, retinal cryopexy and injection of gas, vitreous substitute.  Positioning was reviewed.  The patient's forehead was marked.  We will now plan on positioning only to cover the break.  Sleep on left side down all demonstrated

## 2019-06-24 NOTE — Progress Notes (Signed)
This is an addendum to an ophthalmology encounter on Friday Jun 24, 2019    To document the vision in the right eye of count fingers 1 foot and the intraocular pressure of 15 via Tono-Pen.

## 2019-06-27 ENCOUNTER — Encounter (INDEPENDENT_AMBULATORY_CARE_PROVIDER_SITE_OTHER): Payer: Self-pay | Admitting: Ophthalmology

## 2019-06-27 ENCOUNTER — Other Ambulatory Visit: Payer: Self-pay

## 2019-06-27 ENCOUNTER — Ambulatory Visit (INDEPENDENT_AMBULATORY_CARE_PROVIDER_SITE_OTHER): Payer: Federal, State, Local not specified - PPO | Admitting: Ophthalmology

## 2019-06-27 DIAGNOSIS — H33011 Retinal detachment with single break, right eye: Secondary | ICD-10-CM

## 2019-06-27 DIAGNOSIS — H2513 Age-related nuclear cataract, bilateral: Secondary | ICD-10-CM | POA: Diagnosis not present

## 2019-06-27 DIAGNOSIS — H2511 Age-related nuclear cataract, right eye: Secondary | ICD-10-CM | POA: Diagnosis not present

## 2019-06-27 DIAGNOSIS — H33001 Unspecified retinal detachment with retinal break, right eye: Secondary | ICD-10-CM | POA: Diagnosis not present

## 2019-06-27 DIAGNOSIS — H25013 Cortical age-related cataract, bilateral: Secondary | ICD-10-CM | POA: Diagnosis not present

## 2019-06-27 DIAGNOSIS — H25043 Posterior subcapsular polar age-related cataract, bilateral: Secondary | ICD-10-CM | POA: Diagnosis not present

## 2019-06-27 DIAGNOSIS — H3341 Traction detachment of retina, right eye: Secondary | ICD-10-CM | POA: Insufficient documentation

## 2019-06-27 NOTE — Progress Notes (Signed)
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  Discomfort includes pain.  Vision is stable.  I, the attending physician,  performed the HPI with the patient and updated documentation appropriately.          Comments    5 day Post Op OD.,  Status post scleral buckle with retinal cryopexy for temporal break and temporal detachment, with injection of 0.2 cc of C3F8  Pt has had a dull pain over OD. Has been taking tylenol since yesterday. Using gtts as directed. Doing positioning as directed.       Last edited by Hurman Horn, MD on 06/27/2019  1:33 PM. (History)      Referring physician: [name], [name], DO [address] [place],  [place] [id number]  HISTORICAL INFORMATION:   Selected notes from the MEDICAL RECORD NUMBER    Lab Results  Component Value Date   HGBA1C  12/09/2006    5.6 (NOTE)   The ADA recommends the following therapeutic goals for glycemic   control related to Hgb A1C measurement:   Goal of Therapy:   < 7.0% Hgb A1C   Action Suggested:  > 8.0% Hgb A1C   Ref:  Diabetes Care, 22, Suppl. 1, 1999     CURRENT MEDICATIONS: Current Outpatient Medications (Ophthalmic Drugs)  Medication Sig  . ofloxacin (OCUFLOX) 0.3 % ophthalmic solution Place 1 drop into the right eye 4 (four) times daily.  . prednisoLONE acetate (PRED FORTE) 1 % ophthalmic suspension Place 1 drop into the right eye 4 (four) times daily.   No current facility-administered medications for this visit. (Ophthalmic Drugs)   Current Outpatient Medications (Other)  Medication Sig  . albuterol (VENTOLIN HFA) 108 (90 Base) MCG/ACT inhaler Inhale 2 puffs into the lungs every 6 (six) hours as needed for wheezing or shortness of breath.  . ALBUTEROL IN Inhale into the lungs as needed.  . Albuterol Sulfate 2.5  MG/0.5ML NEBU Inhale into the lungs. 1 nebule every 6 hours as needed  . atorvastatin (LIPITOR) 40 MG tablet Take 1 tablet (40 mg total) by mouth every morning.  . Cholecalciferol (VITAMIN D) 50 MCG (2000 UT) tablet Take 2,000 Units by mouth daily.  . Coenzyme Q10 (CO Q 10 PO) Take 1 tablet by mouth daily.  . fluticasone furoate-vilanterol (BREO ELLIPTA) 100-25 MCG/INH AEPB Inhale 1 puff into the lungs daily.  . Multiple Vitamin (MULTIVITAMIN) tablet Take 1 tablet by mouth every morning.   . nebivolol (BYSTOLIC) 10 MG tablet Take 1 tablet (10 mg total) by mouth daily.  Marland Kitchen olmesartan-hydrochlorothiazide (BENICAR HCT) 40-12.5 MG tablet Take 1 tablet by mouth daily.  Marland Kitchen omeprazole (PRILOSEC) 40 MG capsule Take 1 capsule (40 mg total) by mouth daily.  . pantoprazole (PROTONIX) 40 MG tablet TAKE 1 TABLET BY MOUTH ONCE DAILY (Patient not taking: Reported on 04/08/2019)  . silodosin (RAPAFLO) 8 MG CAPS capsule Take 8 mg by mouth at bedtime.  . solifenacin (VESICARE) 5 MG tablet Take 1 tablet (5 mg total) by mouth daily.   No current facility-administered medications for this visit. (Other)      REVIEW OF SYSTEMS:    ALLERGIES Allergies  Allergen Reactions  . Merthiolate [Thimerosal] Rash    Rash--local  PAST MEDICAL HISTORY Past Medical History:  Diagnosis Date  . BPH (benign prostatic hypertrophy)   . GERD (gastroesophageal reflux disease)   . H/O hiatal hernia   . History of colon polyps   . Hyperlipidemia   . Hypertension   . Knee internal derangement    RIGHT  . Mild asthma   . Vitamin D deficiency   . Wears glasses    Past Surgical History:  Procedure Laterality Date  . COLONOSCOPY W/ POLYPECTOMY  01-21-2008  . ESOPHAGOGASTRODUODENOSCOPY  11-15-2008  . KNEE ARTHROSCOPY Right X4  last one 2005  . KNEE ARTHROSCOPY Right 12/06/2013   Procedure: ARTHROSCOPY RIGHT KNEE WITH DEBRIDMENT AND PARTIAL MEDIAL AND LATERAL MENISCECTOMY AND CHONDRLPLASTY;  Surgeon: [name], MD;   Location: [facility];  Service: Orthopedics;  Laterality: Right;  . LUMBAR DISC SURGERY  04-02-2007   L5 -- S1  . NEGATIVE SLEEP STUDY  per pt  . PARATHYROIDECTOMY  1989   removal adenoma    FAMILY HISTORY Family History  Problem Relation Age of Onset  . Hyperlipidemia Mother   . Coronary artery disease Mother   . Pulmonary embolism Father   . Colon cancer Father   . Hyperlipidemia Brother   . Pancreatic cancer Brother     SOCIAL HISTORY Social History   Tobacco Use  . Smoking status: Never Smoker  . Smokeless tobacco: Never Used  Substance Use Topics  . Alcohol use: Yes    Alcohol/week: 0.0 standard drinks    Comment: social use  . Drug use: No         OPHTHALMIC EXAM:  Base Eye Exam    Visual Acuity (Snellen - Linear)      Right Left   Dist [place] HM 20/20 -+1       Tonometry (Tonopen, 9:13 AM)      Right Left   Pressure 9 11       Pupils      Dark Light Shape React APD   Right 3 3 Round Minimal None   Left 3 2 Round Brisk None       Dilation    Right eye: 1.0% Mydriacyl, 2.5% Phenylephrine @ 9:13 AM        Slit Lamp and Fundus Exam    External Exam      Right Left   External Edema, Periorbital edema Normal       Slit Lamp Exam      Right Left   Lids/Lashes Normal Normal   Conjunctiva/Sclera White and quiet White and quiet   Cornea Clear Clear   Anterior Chamber Deep and quiet, centrally, peripherally shallow temporally when dilated Deep and quiet   Iris Round and reactive, dilates poorly Round and reactive   Lens 2+ Nuclear sclerosis    Anterior Vitreous 2+ pigment Normal       Fundus Exam      Right Left   Posterior Vitreous Mild haze, 10 % gas bubble,, sizable to close the break.    Disc Normal    C/D Ratio 0.2    Macula Macular topographic distortion, and detachment. epiretinal membrane    Vessels Normal    Periphery Good cryopexy at break at 930 to 10 o'clock position., superiorly attached, inferotemporal detachment  with epiretinal membranes, topographic distortion the macula apparent proliferative vitreoretinopathy           IMAGING AND PROCEDURES  Imaging and Procedures for 06/27/19  Color Fundus Photography Optos - OU - Both Eyes  Right Eye Progression has worsened. Disc findings include normal observations.   Left Eye Progression has been stable. Disc findings include normal observations. Macula : normal observations. Vessels : normal observations. Periphery : normal observations.   Notes OD, present gas bubble superiorly.  The hole appears to be well covered with proper positioning.  Macula is detached with topographic distortion with apparent membranes                ASSESSMENT/PLAN:  Traction detachment of right retina The nature of retinal detachment was discussed with the patient as well, as the treatment options which include pneumatic retinopexy, scleral buckling, and vitrectomy scleral buckling.  Possible side effects of the various surgical procedures were discussed with the patient.  Possible complications of surgery were discussed with the patient.  Explained the following success rates are involved with retinal detachment surgeries: 90% rate of reattachment success with primary operation, 85% chance of 2nd operation needed (if Pneumatic isn't done first), 70-75% chance of 3rd operation, & 50% chance of 4th & subsequent surgeries.   Patient understands that ongoing scarring of retina may lead to reoccurrence of retinal tears or detachments.  The patient's questions were answered. An informational brochure was given to the patient.  All the patient's questions were answered.  OD, 1 week status post scleral buckle retinal cryopexy injection of gas.  No resolution of macular subretinal fluid over the past week however there is now significant epiretinal topographic distortion to the macular region, persistent subretinal fluid and concern for proliferative vitreoretinopathy in  this right eye.  Pigment dispersion the vitreous would suggest this is the case.  I explained the patient there is likely cortical vitreous overlying the macular region which has led to this topographic distortion and apparent epiretinal tissue.  I discussed with patient that would likely need vitrectomy, membrane peel, instillation silicone oil to resolve the vitreous membranes.  The patient is is phakic with substantial nuclear sclerosis which would limit the ability to a week adequately remove the anterior hyaloid.  For this reason I have discussed and recommended condition consideration of combination cataract extraction with intraocular lens placement followed thereafter at the same session with vitrectomy membrane peel and likely instillation of silicone oil for more rapid recovery of ambulatory status.  I have offered and discussed possibility of second opinion and contacted Dr. Thurston Hole  [facility] who will make arrangements to contact the patient for consideration of evaluation tomorrow with Dr. Bridgett Larsson of [facility].  The patient did asked [place] to proceed with scheduling a combination case for Wednesday, May 26 here in [facility] should [facility] not be able to proceed in a timely fashion.  I discussed with the patient that Dr. Bridgett Larsson or others may have a separate type of a plan and explained these in some detail with the patient and his wife.      The patient expressed gratitude that we will have backup plan to proceed here in [place] with combination surgeon repair of the retinal detachment pending the outcome of second opinion with Dr. Bridgett Larsson at [facility], should this be necessary.     Nuclear sclerotic cataract of right eye I have asked and enlisted the assistance of Dr. Audry Pili for cataract traction with intraocular lens placement as a planned sequential combination surgery in order to repair retinal detachment via vitrectomy, endolaser, membrane peel, and  complete  release and removal of the anterior hyaloid face.  OD.  Patient is to  proceed to this afternoon at 230pm  examination and evaluation with Dr. Audry Pili, and we will plan surgical repair in 2 days.      ICD-10-CM   1. Retinal detachment of right eye with single break  H33.011 Color Fundus Photography Optos - OU - Both Eyes  2. Traction detachment of right retina  H33.41   3. Nuclear sclerotic cataract of right eye  H25.11     1.  OD, now with progression to traction retinal detachment vitreous membranes likely proliferative vitreoretinopathy currently stage C2-C3, macula off  2.  OD, history of a retinal tear 10 meridian treated with laser retinopexy April 12, 2019.  3.  OD, status post retinal detachment repair 06/22/2019 via scleral buckle retinal cryopexy, injection of C3F8 0.2 cc  Ophthalmic Meds Ordered this visit:  No orders of the defined types were placed in this encounter.      Return if symptoms worsen or fail to improve, for     We will schedule vitrectomy, membrane peel, laser, injection silicone oil OD, as backup plan, OD.  There are no Patient Instructions on file for this visit.   The patient will be contacted by [facility] today.  Patient will have second opinion with Dr. Bridgett Larsson of the vitreal retinal service tomorrow.  Patient and family may proceed under the care of Dr. Bridgett Larsson and [facility] should they wish.   Explained the diagnoses, plan, and follow up with the patient and they expressed understanding.  Patient expressed understanding of the importance of proper follow up care.   Clent Demark Tibor Lemmons M.D. Diseases & Surgery of the Retina and Vitreous Retina & Diabetic [place] 06/27/19     Abbreviations: M myopia (nearsighted); A astigmatism; H hyperopia (farsighted); P presbyopia; Mrx spectacle prescription;  CTL contact lenses; OD right eye; OS left eye; OU both eyes  XT exotropia; ET esotropia; PEK punctate epithelial keratitis; PEE punctate  epithelial erosions; DES dry eye syndrome; MGD meibomian gland dysfunction; ATs artificial tears; PFAT's preservative free artificial tears; [place] nuclear sclerotic cataract; PSC posterior subcapsular cataract; ERM epi-retinal membrane; PVD posterior vitreous detachment; RD retinal detachment; DM diabetes mellitus; DR diabetic retinopathy; NPDR non-proliferative diabetic retinopathy; PDR proliferative diabetic retinopathy; CSME clinically significant macular edema; DME diabetic macular edema; dbh dot blot hemorrhages; CWS cotton wool spot; POAG primary open angle glaucoma; C/D cup-to-disc ratio; HVF humphrey visual field; GVF goldmann visual field; OCT optical coherence tomography; IOP intraocular pressure; BRVO Branch retinal vein occlusion; CRVO central retinal vein occlusion; CRAO central retinal artery occlusion; BRAO branch retinal artery occlusion; RT retinal tear; SB scleral buckle; PPV pars plana vitrectomy; VH Vitreous hemorrhage; PRP panretinal laser photocoagulation; IVK intravitreal kenalog; VMT vitreomacular traction; MH Macular hole;  NVD neovascularization of the disc; NVE neovascularization elsewhere; AREDS age related eye disease study; ARMD age related macular degeneration; POAG primary open angle glaucoma; EBMD epithelial/anterior basement membrane dystrophy; ACIOL anterior chamber intraocular lens; IOL intraocular lens; PCIOL posterior chamber intraocular lens; Phaco/IOL phacoemulsification with intraocular lens placement; [place] photorefractive keratectomy; LASIK laser assisted in situ keratomileusis; HTN hypertension; DM diabetes mellitus; COPD chronic obstructive pulmonary disease

## 2019-06-27 NOTE — Assessment & Plan Note (Signed)
I have asked and enlisted the assistance of Dr. Audry Pili for cataract traction with intraocular lens placement as a planned sequential combination surgery in order to repair retinal detachment via vitrectomy, endolaser, membrane peel, and complete  release and removal of the anterior hyaloid face.  OD.  Patient is to proceed to this afternoon at 230pm  examination and evaluation with Dr. Audry Pili, and we will plan surgical repair in 2 days.

## 2019-06-27 NOTE — Assessment & Plan Note (Addendum)
The nature of retinal detachment was discussed with the patient as well, as the treatment options which include pneumatic retinopexy, scleral buckling, and vitrectomy scleral buckling.  Possible side effects of the various surgical procedures were discussed with the patient.  Possible complications of surgery were discussed with the patient.  Explained the following success rates are involved with retinal detachment surgeries: 90% rate of reattachment success with primary operation, 85% chance of 2nd operation needed (if Pneumatic isn't done first), 70-75% chance of 3rd operation, & 50% chance of 4th & subsequent surgeries.   Patient understands that ongoing scarring of retina may lead to reoccurrence of retinal tears or detachments.  The patient's questions were answered. An informational brochure was given to the patient.  All the patient's questions were answered.  OD, 1 week status post scleral buckle retinal cryopexy injection of gas.  No resolution of macular subretinal fluid over the past week however there is now significant epiretinal topographic distortion to the macular region, persistent subretinal fluid and concern for proliferative vitreoretinopathy in this right eye.  Pigment dispersion the vitreous would suggest this is the case.  I explained the patient there is likely cortical vitreous overlying the macular region which has led to this topographic distortion and apparent epiretinal tissue.  I discussed with patient that would likely need vitrectomy, membrane peel, instillation silicone oil to resolve the vitreous membranes.  The patient is is phakic with substantial nuclear sclerosis which would limit the ability to a week adequately remove the anterior hyaloid.  For this reason I have discussed and recommended condition consideration of combination cataract extraction with intraocular lens placement followed thereafter at the same session with vitrectomy membrane peel and likely instillation  of silicone oil for more rapid recovery of ambulatory status.  I have offered and discussed possibility of second opinion and contacted Dr. Thurston Hole  [facility] who will make arrangements to contact the patient for consideration of evaluation tomorrow with Dr. Bridgett Larsson of [facility].  The patient did asked [place] to proceed with scheduling a combination case for Wednesday, May 26 here in [facility] should [facility] not be able to proceed in a timely fashion.  I discussed with the patient that Dr. Bridgett Larsson or others may have a separate type of a plan and explained these in some detail with the patient and his wife.      The patient expressed gratitude that we will have backup plan to proceed here in [place] with combination surgeon repair of the retinal detachment pending the outcome of second opinion with Dr. Bridgett Larsson at [facility], should this be necessary.

## 2019-06-28 DIAGNOSIS — H3321 Serous retinal detachment, right eye: Secondary | ICD-10-CM | POA: Diagnosis not present

## 2019-06-29 ENCOUNTER — Encounter (INDEPENDENT_AMBULATORY_CARE_PROVIDER_SITE_OTHER): Payer: Federal, State, Local not specified - PPO | Admitting: Ophthalmology

## 2019-06-29 DIAGNOSIS — H3341 Traction detachment of retina, right eye: Secondary | ICD-10-CM | POA: Diagnosis not present

## 2019-06-29 DIAGNOSIS — H2511 Age-related nuclear cataract, right eye: Secondary | ICD-10-CM | POA: Diagnosis not present

## 2019-06-30 ENCOUNTER — Ambulatory Visit (INDEPENDENT_AMBULATORY_CARE_PROVIDER_SITE_OTHER): Payer: Federal, State, Local not specified - PPO | Admitting: Ophthalmology

## 2019-06-30 ENCOUNTER — Other Ambulatory Visit: Payer: Self-pay

## 2019-06-30 ENCOUNTER — Encounter (INDEPENDENT_AMBULATORY_CARE_PROVIDER_SITE_OTHER): Payer: Federal, State, Local not specified - PPO | Admitting: Ophthalmology

## 2019-06-30 ENCOUNTER — Encounter (INDEPENDENT_AMBULATORY_CARE_PROVIDER_SITE_OTHER): Payer: Self-pay | Admitting: Ophthalmology

## 2019-06-30 DIAGNOSIS — Z09 Encounter for follow-up examination after completed treatment for conditions other than malignant neoplasm: Secondary | ICD-10-CM

## 2019-06-30 NOTE — Assessment & Plan Note (Signed)
POST OP RETINAL DETACHMENT REPAIR, USE OF OIL- The operative eye has oil in place.  Do not sleep, rest flat on back for long periods of time, preferably, no longer than 10 minutes.  Sleep or rest on either side.  Travel may include to regions of elevation including mountains, or the co[place].  Airline travel is permitted. Patient to start vitamin Super B Complex.

## 2019-06-30 NOTE — Progress Notes (Signed)
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  Discomfort includes pain.  Vision is stable.          Comments    1 day PO OD Patient states his eye is 'ok'. Patient states he woke up at 1 AM with a 'hot poker going through it'. Patient states that Advil helped.       Last edited by Gerda Diss on 06/30/2019  8:27 AM. (History)      Referring physician: [name], [name], DO [address] [place],  [place] [address]  HISTORICAL INFORMATION:   Selected notes from the MEDICAL RECORD NUMBER    Lab Results  Component Value Date   HGBA1C  12/09/2006    5.6 (NOTE)   The ADA recommends the following therapeutic goals for glycemic   control related to Hgb A1C measurement:   Goal of Therapy:   < 7.0% Hgb A1C   Action Suggested:  > 8.0% Hgb A1C   Ref:  Diabetes Care, 22, Suppl. 1, 1999     CURRENT MEDICATIONS: Current Outpatient Medications (Ophthalmic Drugs)  Medication Sig  . ofloxacin (OCUFLOX) 0.3 % ophthalmic solution Place 1 drop into the right eye 4 (four) times daily.  . prednisoLONE acetate (PRED FORTE) 1 % ophthalmic suspension Place 1 drop into the right eye 4 (four) times daily.   No current facility-administered medications for this visit. (Ophthalmic Drugs)   Current Outpatient Medications (Other)  Medication Sig  . albuterol (VENTOLIN HFA) 108 (90 Base) MCG/ACT inhaler Inhale 2 puffs into the lungs every 6 (six) hours as needed for wheezing or shortness of breath.  . ALBUTEROL IN Inhale into the lungs as needed.  . Albuterol Sulfate 2.5 MG/0.5ML NEBU Inhale into the lungs. 1 nebule every 6 hours as needed  . atorvastatin (LIPITOR) 40 MG tablet Take 1 tablet (40 mg total) by mouth every morning.  . Cholecalciferol (VITAMIN D) 50 MCG (2000 UT) tablet Take 2,000 Units by  mouth daily.  . Coenzyme Q10 (CO Q 10 PO) Take 1 tablet by mouth daily.  . fluticasone furoate-vilanterol (BREO ELLIPTA) 100-25 MCG/INH AEPB Inhale 1 puff into the lungs daily.  . Multiple Vitamin (MULTIVITAMIN) tablet Take 1 tablet by mouth every morning.   . nebivolol (BYSTOLIC) 10 MG tablet Take 1 tablet (10 mg total) by mouth daily.  Marland Kitchen olmesartan-hydrochlorothiazide (BENICAR HCT) 40-12.5 MG tablet Take 1 tablet by mouth daily.  Marland Kitchen omeprazole (PRILOSEC) 40 MG capsule Take 1 capsule (40 mg total) by mouth daily.  . pantoprazole (PROTONIX) 40 MG tablet TAKE 1 TABLET BY MOUTH ONCE DAILY (Patient not taking: Reported on 04/08/2019)  . silodosin (RAPAFLO) 8 MG CAPS capsule Take 8 mg by mouth at bedtime.  . solifenacin (VESICARE) 5 MG tablet Take 1 tablet (5 mg total) by mouth daily.   No current facility-administered medications for this visit. (Other)      REVIEW OF SYSTEMS:    ALLERGIES Allergies  Allergen Reactions  . Merthiolate [Thimerosal] Rash    Rash--local    PAST MEDICAL HISTORY Past Medical History:  Diagnosis Date  . BPH (benign prostatic hypertrophy)   . GERD (gastroesophageal reflux disease)   . H/O hiatal hernia   . History of colon polyps   .  Hyperlipidemia   . Hypertension   . Knee internal derangement    RIGHT  . Mild asthma   . Vitamin D deficiency   . Wears glasses    Past Surgical History:  Procedure Laterality Date  . COLONOSCOPY W/ POLYPECTOMY  01-21-2008  . ESOPHAGOGASTRODUODENOSCOPY  11-15-2008  . KNEE ARTHROSCOPY Right X4  last one 2005  . KNEE ARTHROSCOPY Right 12/06/2013   Procedure: ARTHROSCOPY RIGHT KNEE WITH DEBRIDMENT AND PARTIAL MEDIAL AND LATERAL MENISCECTOMY AND CHONDRLPLASTY;  Surgeon: [name], MD;  Location: [facility];  Service: Orthopedics;  Laterality: Right;  . LUMBAR DISC SURGERY  04-02-2007   L5 -- S1  . NEGATIVE SLEEP STUDY  per pt  . PARATHYROIDECTOMY  1989   removal adenoma    FAMILY HISTORY Family  History  Problem Relation Age of Onset  . Hyperlipidemia Mother   . Coronary artery disease Mother   . Pulmonary embolism Father   . Colon cancer Father   . Hyperlipidemia Brother   . Pancreatic cancer Brother     SOCIAL HISTORY Social History   Tobacco Use  . Smoking status: Never Smoker  . Smokeless tobacco: Never Used  Substance Use Topics  . Alcohol use: Yes    Alcohol/week: 0.0 standard drinks    Comment: social use  . Drug use: No         OPHTHALMIC EXAM: Base Eye Exam    Visual Acuity (Snellen - Linear)      Right Left   Dist [place] HM 20/80-1   Dist ph [place]  20/30+2       Tonometry (Tonopen, 8:35 AM)      Right Left   Pressure 31 12       Dilation    Right eye: 1.0% Mydriacyl, 2.5% Phenylephrine @ 8:37 AM        Slit Lamp and Fundus Exam    External Exam      Right Left   External Normal Normal       Slit Lamp Exam      Right Left   Lids/Lashes Normal Normal   Conjunctiva/Sclera White and quiet White and quiet   Cornea Clear Clear   Anterior Chamber Deep and quiet Deep and quiet   Iris Round and reactive Round and reactive   Lens Posterior chamber intraocular lens, Centered posterior chamber intraocular lens    Anterior Vitreous Normal Normal       Fundus Exam      Right Left   Posterior Vitreous Clear, silicone oil    C/D Ratio 0.25    Macula Attached    Periphery Attached           IMAGING AND PROCEDURES  Imaging and Procedures for 06/30/19           ASSESSMENT/PLAN:  No problem-specific Assessment & Plan notes found for this encounter.    No diagnosis found.  1.  2.  3.  Ophthalmic Meds Ordered this visit:  No orders of the defined types were placed in this encounter.      No follow-ups on file.  There are no Patient Instructions on file for this visit.   Explained the diagnoses, plan, and follow up with the patient and they expressed understanding.  Patient expressed understanding of the importance of  proper follow up care.   Clent Demark Creedon Danielski M.D. Diseases & Surgery of the Retina and Vitreous Retina [facility] Diabetic [place] 06/30/19     Abbreviations: M myopia (nearsighted); A  astigmatism; H hyperopia (farsighted); P presbyopia; Mrx spectacle prescription;  CTL contact lenses; OD right eye; OS left eye; OU both eyes  XT exotropia; ET esotropia; PEK punctate epithelial keratitis; PEE punctate epithelial erosions; DES dry eye syndrome; MGD meibomian gland dysfunction; ATs artificial tears; PFAT's preservative free artificial tears; [place] nuclear sclerotic cataract; PSC posterior subcapsular cataract; ERM epi-retinal membrane; PVD posterior vitreous detachment; RD retinal detachment; DM diabetes mellitus; DR diabetic retinopathy; NPDR non-proliferative diabetic retinopathy; PDR proliferative diabetic retinopathy; CSME clinically significant macular edema; DME diabetic macular edema; dbh dot blot hemorrhages; CWS cotton wool spot; POAG primary open angle glaucoma; C/D cup-to-disc ratio; HVF humphrey visual field; GVF goldmann visual field; OCT optical coherence tomography; IOP intraocular pressure; BRVO Branch retinal vein occlusion; CRVO central retinal vein occlusion; CRAO central retinal artery occlusion; BRAO branch retinal artery occlusion; RT retinal tear; SB scleral buckle; PPV pars plana vitrectomy; VH Vitreous hemorrhage; PRP panretinal laser photocoagulation; IVK intravitreal kenalog; VMT vitreomacular traction; MH Macular hole;  NVD neovascularization of the disc; NVE neovascularization elsewhere; AREDS age related eye disease study; ARMD age related macular degeneration; POAG primary open angle glaucoma; EBMD epithelial/anterior basement membrane dystrophy; ACIOL anterior chamber intraocular lens; IOL intraocular lens; PCIOL posterior chamber intraocular lens; Phaco/IOL phacoemulsification with intraocular lens placement; [place] photorefractive keratectomy; LASIK laser assisted in situ keratomileusis;  HTN hypertension; DM diabetes mellitus; COPD chronic obstructive pulmonary disease

## 2019-07-05 ENCOUNTER — Encounter (INDEPENDENT_AMBULATORY_CARE_PROVIDER_SITE_OTHER): Payer: Self-pay | Admitting: Ophthalmology

## 2019-07-05 ENCOUNTER — Other Ambulatory Visit (INDEPENDENT_AMBULATORY_CARE_PROVIDER_SITE_OTHER): Payer: Self-pay | Admitting: Ophthalmology

## 2019-07-05 ENCOUNTER — Ambulatory Visit (INDEPENDENT_AMBULATORY_CARE_PROVIDER_SITE_OTHER): Payer: Federal, State, Local not specified - PPO | Admitting: Ophthalmology

## 2019-07-05 DIAGNOSIS — H33301 Unspecified retinal break, right eye: Secondary | ICD-10-CM

## 2019-07-05 DIAGNOSIS — H35371 Puckering of macula, right eye: Secondary | ICD-10-CM | POA: Insufficient documentation

## 2019-07-05 DIAGNOSIS — Z09 Encounter for follow-up examination after completed treatment for conditions other than malignant neoplasm: Secondary | ICD-10-CM

## 2019-07-05 DIAGNOSIS — H3341 Traction detachment of retina, right eye: Secondary | ICD-10-CM

## 2019-07-05 HISTORY — DX: Puckering of macula, right eye: H35.371

## 2019-07-05 NOTE — Assessment & Plan Note (Addendum)
Severe epiretinal membrane with topographic distortion signifying likely proliferative vitreoretinopathy.  Visualized at the time of surgery and tissue blue stain unable to identify internal limiting membrane.  Thus likely this is epiretinal tissue or cortical vitreous tissue  I spent some time explaining that this condition with severe topographic distortion in the foveal region may in fact need surgical attention while the oil is in place.  I explained that to port vitrectomy with membrane peel may be required once the retina peripherally stabilizes in the a attached position.  We will monitor this on a near weekly basis prior to further decision

## 2019-07-05 NOTE — Assessment & Plan Note (Signed)
1 week status post repair via combination of cataract and intraocular lens placement with vitrectomy membrane peel, drainage of subretinal fluid, injection silicone oil [id number] CS.  Retina is completely attached.  Good retinopexy .  I will add additional laser retinopexy to retinotomy inferior to the optic nerve as there appears to be very little laser take up in this region.

## 2019-07-05 NOTE — Progress Notes (Signed)
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          Comments    Postop day #7 status post vitrectomy, membrane peel, resection of inferior proliferative vitreoretinopathy, endolaser, drainage of subretinal fluid, injection silicone oil [id number] [place] Right eye, as part of the retina component of combination cataract extraction with intraocular lens placement by Dr. Talbert Forest prior to vitrectomy  Patient patient reports seeing flickering light and "wrinkled" vision centrally with a gray area superiorly and surrounded by a "white hot "type of flickering       Last edited by Hurman Horn, MD on 07/05/2019 10:23 AM. (History)      Referring physician: No referring provider defined for this encounter.  HISTORICAL INFORMATION:   Selected notes from the MEDICAL RECORD NUMBER    Lab Results  Component Value Date   HGBA1C  12/09/2006    5.6 (NOTE)   The ADA recommends the following therapeutic goals for glycemic   control related to Hgb A1C measurement:   Goal of Therapy:   < 7.0% Hgb A1C   Action Suggested:  > 8.0% Hgb A1C   Ref:  Diabetes Care, 22, Suppl. 1, 1999     CURRENT MEDICATIONS: Current Outpatient Medications (Ophthalmic Drugs)  Medication Sig  . ofloxacin (OCUFLOX) 0.3 % ophthalmic solution Place 1 drop into the right eye 4 (four) times daily.  . prednisoLONE acetate (PRED FORTE) 1 % ophthalmic suspension Place 1 drop into the right eye 4 (four) times daily.   No current facility-administered medications for this visit. (Ophthalmic Drugs)   Current Outpatient Medications (Other)  Medication Sig  . albuterol (VENTOLIN HFA) 108 (90 Base) MCG/ACT inhaler Inhale 2 puffs into the lungs every 6 (six) hours as needed for wheezing or shortness of breath.  . ALBUTEROL IN Inhale into the lungs as  needed.  . Albuterol Sulfate 2.5 MG/0.5ML NEBU Inhale into the lungs. 1 nebule every 6 hours as needed  . atorvastatin (LIPITOR) 40 MG tablet Take 1 tablet (40 mg total) by mouth every morning.  . Cholecalciferol (VITAMIN D) 50 MCG (2000 UT) tablet Take 2,000 Units by mouth daily.  . Coenzyme Q10 (CO Q 10 PO) Take 1 tablet by mouth daily.  . fluticasone furoate-vilanterol (BREO ELLIPTA) 100-25 MCG/INH AEPB Inhale 1 puff into the lungs daily.  . Multiple Vitamin (MULTIVITAMIN) tablet Take 1 tablet by mouth every morning.   . nebivolol (BYSTOLIC) 10 MG tablet Take 1 tablet (10 mg total) by mouth daily.  Marland Kitchen olmesartan-hydrochlorothiazide (BENICAR HCT) 40-12.5 MG tablet Take 1 tablet by mouth daily.  Marland Kitchen omeprazole (PRILOSEC) 40 MG capsule Take 1 capsule (40 mg total) by mouth daily.  . pantoprazole (PROTONIX) 40 MG tablet TAKE 1 TABLET BY MOUTH ONCE DAILY (Patient not taking: Reported on 04/08/2019)  . silodosin (RAPAFLO) 8 MG CAPS capsule Take 8 mg by mouth at bedtime.  . solifenacin (VESICARE) 5 MG tablet Take 1 tablet (5 mg total) by mouth daily.   No current facility-administered medications for this visit. (Other)      REVIEW OF SYSTEMS:    ALLERGIES Allergies  Allergen Reactions  . Merthiolate [Thimerosal] Rash    Rash--local    PAST MEDICAL HISTORY Past Medical History:  Diagnosis Date  . BPH (  benign prostatic hypertrophy)   . GERD (gastroesophageal reflux disease)   . H/O hiatal hernia   . History of colon polyps   . Hyperlipidemia   . Hypertension   . Knee internal derangement    RIGHT  . Mild asthma   . Vitamin D deficiency   . Wears glasses    Past Surgical History:  Procedure Laterality Date  . COLONOSCOPY W/ POLYPECTOMY  01-21-2008  . ESOPHAGOGASTRODUODENOSCOPY  11-15-2008  . KNEE ARTHROSCOPY Right X4  last one 2005  . KNEE ARTHROSCOPY Right 12/06/2013   Procedure: ARTHROSCOPY RIGHT KNEE WITH DEBRIDMENT AND PARTIAL MEDIAL AND LATERAL MENISCECTOMY AND  CHONDRLPLASTY;  Surgeon: [name], MD;  Location: [facility];  Service: Orthopedics;  Laterality: Right;  . LUMBAR DISC SURGERY  04-02-2007   L5 -- S1  . NEGATIVE SLEEP STUDY  per pt  . PARATHYROIDECTOMY  1989   removal adenoma    FAMILY HISTORY Family History  Problem Relation Age of Onset  . Hyperlipidemia Mother   . Coronary artery disease Mother   . Pulmonary embolism Father   . Colon cancer Father   . Hyperlipidemia Brother   . Pancreatic cancer Brother     SOCIAL HISTORY Social History   Tobacco Use  . Smoking status: Never Smoker  . Smokeless tobacco: Never Used  Substance Use Topics  . Alcohol use: Yes    Alcohol/week: 0.0 standard drinks    Comment: social use  . Drug use: No         OPHTHALMIC EXAM:  Base Eye Exam    Visual Acuity (ETDRS)      Right Left   Dist [place] 20/400    Dist ph [place] 20/200        Tonometry (Tonopen, 10:08 AM)      Right Left   Pressure 14        Dilation    Right eye: 1.0% Mydriacyl, 2.5% Phenylephrine @ 10:08 AM        Slit Lamp and Fundus Exam    External Exam      Right Left   External Normal Normal       Slit Lamp Exam      Right Left   Lids/Lashes Normal Normal   Conjunctiva/Sclera White and quiet White and quiet   Cornea Clear Clear   Anterior Chamber Deep and quiet Deep and quiet   Iris Round and reactive Round and reactive   Lens Posterior chamber intraocular lens, Centered posterior chamber intraocular lens    Anterior Vitreous Normal Normal       Fundus Exam      Right Left   Posterior Vitreous Clear, silicone oil    Disc Normal    C/D Ratio 0.25    Macula Attached    Vessels Normal    Periphery Attached,,good pexy inferiorly, good buckle, tire is temporal, band 360           IMAGING AND PROCEDURES  Imaging and Procedures for 07/05/19  OCT, Retina - OD - Right Eye       Quality was good. Scan locations included subfoveal. Central Foveal Thickness: 745. Progression  has worsened. Findings include abnormal foveal contour, epiretinal membrane.   Notes Severe macular pucker, retinal thickening.  No retinal detachment, yet residual small subretinal serous fluid temporally.  Clear oil.       Color Fundus Photography Optos - OD - Right Eye       Progression has worsened. Disc findings include normal  observations. Macula : epiretinal membrane. Vessels : normal observations.   Notes OD, with severe epiretinal topographic distortion from epiretinal membrane.  Retina detached centrally and peripherally.  Good laser retinopexy.,   new retinotomy inferior to the nerve with very minimal laser reaction apparent.  For this reason do recommend laser retinopexy additional today       Repair Retinal Breaks, Laser - OD - Right Eye       Tear locations include inferior.   Time Out Confirmed correct patient, procedure, site, and patient consented.   Anesthesia Topical anesthesia was used. Anesthetic medications included Proparacaine 0.5%.   Laser Information The type of laser was diode. Color was yellow. The duration in seconds was 0.05. The spot size was 390 microns. Laser power was 320. Total spots was 112.   Post-op The patient tolerated the procedure well. There were no complications. The patient received written and verbal post procedure care education.   Notes Laser retinopexy applied to retinotomy site inferior to the optic nerve atraumatically good laser reaction postoperatively                ASSESSMENT/PLAN:  Traction detachment of right retina 1 week status post repair via combination of cataract and intraocular lens placement with vitrectomy membrane peel, drainage of subretinal fluid, injection silicone oil [id number] CS.  Retina is completely attached.  Good retinopexy .  I will add additional laser retinopexy to retinotomy inferior to the optic nerve as there appears to be very little laser take up in this region.  Macular pucker,  right eye Severe epiretinal membrane with topographic distortion signifying likely proliferative vitreoretinopathy.  Visualized at the time of surgery and tissue blue stain unable to identify internal limiting membrane.  Thus likely this is epiretinal tissue or cortical vitreous tissue  Follow-up examination after eye surgery Today the retina is attached, looks great after combination cataract traction with intraocular lens placement with vitrectomy, membrane peel, instillation silicone oil after laser photocoagulation retinopexy OD      ICD-10-CM   1. Macular pucker, right eye  H35.371 OCT, Retina - OD - Right Eye    Color Fundus Photography Optos - OD - Right Eye  2. Traction detachment of right retina  H33.41   3. Follow-up examination after eye surgery  Z09   4. Retinal break of right eye  H33.301 Repair Retinal Breaks, Laser - OD - Right Eye    1.new retinotomy inferior to the nerve with very minimal laser reaction apparent.  For this reason do recommend laser retinopexy additional today  2.  Patient is to complete ofloxacin 3 times daily OD until bottle complete  3.  Patient is to continue topical prednisolone acetate 4 times daily OD until further instructed 4.  Patient return follow-up visit in about 1 week, patient will call office next week when the office opens  Ophthalmic Meds Ordered this visit:  No orders of the defined types were placed in this encounter.      Return in about 1 week (around 07/12/2019) for OD, dilate, OCT, COLOR FP.  Patient Instructions  Patient use topical ofloxacin 3 times daily until current bottle complete  Patient to use topical prednisolone acetate to the right eye 4 times daily until next office visit    Explained the diagnoses, plan, and follow up with the patient and they expressed understanding.  Patient expressed understanding of the importance of proper follow up care.   Clent Demark Saffron Busey M.D. Diseases & Surgery of the Retina and  Vitreous Retina & Diabetic [place] 07/05/19     Abbreviations: M myopia (nearsighted); A astigmatism; H hyperopia (farsighted); P presbyopia; Mrx spectacle prescription;  CTL contact lenses; OD right eye; OS left eye; OU both eyes  XT exotropia; ET esotropia; PEK punctate epithelial keratitis; PEE punctate epithelial erosions; DES dry eye syndrome; MGD meibomian gland dysfunction; ATs artificial tears; PFAT's preservative free artificial tears; [place] nuclear sclerotic cataract; PSC posterior subcapsular cataract; ERM epi-retinal membrane; PVD posterior vitreous detachment; RD retinal detachment; DM diabetes mellitus; DR diabetic retinopathy; NPDR non-proliferative diabetic retinopathy; PDR proliferative diabetic retinopathy; CSME clinically significant macular edema; DME diabetic macular edema; dbh dot blot hemorrhages; CWS cotton wool spot; POAG primary open angle glaucoma; C/D cup-to-disc ratio; HVF humphrey visual field; GVF goldmann visual field; OCT optical coherence tomography; IOP intraocular pressure; BRVO Branch retinal vein occlusion; CRVO central retinal vein occlusion; CRAO central retinal artery occlusion; BRAO branch retinal artery occlusion; RT retinal tear; SB scleral buckle; PPV pars plana vitrectomy; VH Vitreous hemorrhage; PRP panretinal laser photocoagulation; IVK intravitreal kenalog; VMT vitreomacular traction; MH Macular hole;  NVD neovascularization of the disc; NVE neovascularization elsewhere; AREDS age related eye disease study; ARMD age related macular degeneration; POAG primary open angle glaucoma; EBMD epithelial/anterior basement membrane dystrophy; ACIOL anterior chamber intraocular lens; IOL intraocular lens; PCIOL posterior chamber intraocular lens; Phaco/IOL phacoemulsification with intraocular lens placement; [place] photorefractive keratectomy; LASIK laser assisted in situ keratomileusis; HTN hypertension; DM diabetes mellitus; COPD chronic obstructive pulmonary disease

## 2019-07-05 NOTE — Patient Instructions (Signed)
Patient use topical ofloxacin 3 times daily until current bottle complete  Patient to use topical prednisolone acetate to the right eye 4 times daily until next office visit

## 2019-07-05 NOTE — Assessment & Plan Note (Signed)
Today the retina is attached, looks great after combination cataract traction with intraocular lens placement with vitrectomy, membrane peel, instillation silicone oil after laser photocoagulation retinopexy OD

## 2019-07-07 ENCOUNTER — Other Ambulatory Visit: Payer: Federal, State, Local not specified - PPO

## 2019-07-11 ENCOUNTER — Other Ambulatory Visit: Payer: Self-pay | Admitting: Family Medicine

## 2019-07-11 DIAGNOSIS — J4520 Mild intermittent asthma, uncomplicated: Secondary | ICD-10-CM

## 2019-07-11 DIAGNOSIS — I10 Essential (primary) hypertension: Secondary | ICD-10-CM

## 2019-07-11 DIAGNOSIS — E785 Hyperlipidemia, unspecified: Secondary | ICD-10-CM

## 2019-07-11 NOTE — Telephone Encounter (Signed)
Refill one bottle

## 2019-07-14 ENCOUNTER — Ambulatory Visit (INDEPENDENT_AMBULATORY_CARE_PROVIDER_SITE_OTHER): Payer: Federal, State, Local not specified - PPO | Admitting: Ophthalmology

## 2019-07-14 ENCOUNTER — Encounter (INDEPENDENT_AMBULATORY_CARE_PROVIDER_SITE_OTHER): Payer: Self-pay | Admitting: Ophthalmology

## 2019-07-14 ENCOUNTER — Other Ambulatory Visit: Payer: Self-pay

## 2019-07-14 DIAGNOSIS — H35371 Puckering of macula, right eye: Secondary | ICD-10-CM

## 2019-07-14 NOTE — Assessment & Plan Note (Signed)
The nature of macular pucker (epiretinal membrane ERM) was discussed with the patient as well as threshold criteria for vitrectomy surgery. I explained that in rare cases another surgery is needed to actually remove a second wrinkle should it regrow.  Most often, the epiretinal membrane and underlying wrinkled internal limiting membrane are removed with the first surgery, to accomplish the goals.   If the operative eye is [place] (natural lens still present), cataract surgery is often recommended prior to Vitrectomy. This will enable the retina surgeon to have the best view during surgery and the patient to obtain optimal results in the future. Treatment options were discussed.  OD, with maturing epiretinal membrane. On color photos and clinical examination prominent epiretinal tissue in addition to internal limiting membrane distortion of the foveal region. Retina is attached. In the midst of this form of PVR, will need surgical intervention to remove the macular topographic distortion via internal limiting membrane peel and epiretinal membrane peel without removal of silicone oil. Will hasten normal return of macular topography and allow for visual acuity to stabilize and improve.

## 2019-07-14 NOTE — Progress Notes (Signed)
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  Discomfort includes foreign body sensation and floaters.  Vision is stable.          Comments    1 week PO OD - FP, OCT OU Patient states that he is abel to see more light now, but his vision is distorted.       Last edited by Gerda Diss on 07/14/2019  8:10 AM. (History)      Referring physician: [name], [name], DO [address] [place],  [place] [address]  HISTORICAL INFORMATION:   Selected notes from the MEDICAL RECORD NUMBER    Lab Results  Component Value Date   HGBA1C  12/09/2006    5.6 (NOTE)   The ADA recommends the following therapeutic goals for glycemic   control related to Hgb A1C measurement:   Goal of Therapy:   < 7.0% Hgb A1C   Action Suggested:  > 8.0% Hgb A1C   Ref:  Diabetes Care, 22, Suppl. 1, 1999     CURRENT MEDICATIONS: Current Outpatient Medications (Ophthalmic Drugs)  Medication Sig  . ofloxacin (OCUFLOX) 0.3 % ophthalmic solution Place 1 drop into the right eye 4 (four) times daily.  . prednisoLONE acetate (PRED FORTE) 1 % ophthalmic suspension SHAKE LIQUID AND INSTILL 1 DROP IN RIGHT EYE FOUR TIMES DAILY   No current facility-administered medications for this visit. (Ophthalmic Drugs)   Current Outpatient Medications (Other)  Medication Sig  . albuterol (VENTOLIN HFA) 108 (90 Base) MCG/ACT inhaler Inhale 2 puffs into the lungs every 6 (six) hours as needed for wheezing or shortness of breath.  . ALBUTEROL IN Inhale into the lungs as needed.  . Albuterol Sulfate 2.5 MG/0.5ML NEBU Inhale into the lungs. 1 nebule every 6 hours as needed  . atorvastatin (LIPITOR) 40 MG tablet TAKE 1 TABLET EVERY MORNING  . BREO ELLIPTA 100-25 MCG/INH AEPB USE 1 INHALATION ORALLY    DAILY  . Cholecalciferol (VITAMIN D)  50 MCG (2000 UT) tablet Take 2,000 Units by mouth daily.  . Coenzyme Q10 (CO Q 10 PO) Take 1 tablet by mouth daily.  . Multiple Vitamin (MULTIVITAMIN) tablet Take 1 tablet by mouth every morning.   . nebivolol (BYSTOLIC) 10 MG tablet Take 1 tablet (10 mg total) by mouth daily.  Marland Kitchen olmesartan-hydrochlorothiazide (BENICAR HCT) 40-12.5 MG tablet TAKE 1 TABLET DAILY  . omeprazole (PRILOSEC) 40 MG capsule Take 1 capsule (40 mg total) by mouth daily.  . pantoprazole (PROTONIX) 40 MG tablet TAKE 1 TABLET BY MOUTH ONCE DAILY (Patient not taking: Reported on 04/08/2019)  . silodosin (RAPAFLO) 8 MG CAPS capsule Take 8 mg by mouth at bedtime.  . solifenacin (VESICARE) 5 MG tablet Take 1 tablet (5 mg total) by mouth daily.   No current facility-administered medications for this visit. (Other)      REVIEW OF SYSTEMS:    ALLERGIES Allergies  Allergen Reactions  . Merthiolate [Thimerosal] Rash    Rash--local    PAST MEDICAL HISTORY Past Medical History:  Diagnosis Date  . BPH (benign prostatic hypertrophy)   . GERD (gastroesophageal reflux disease)   . H/O hiatal hernia   . History of colon polyps   . Hyperlipidemia   . Hypertension   .  Knee internal derangement    RIGHT  . Mild asthma   . Vitamin D deficiency   . Wears glasses    Past Surgical History:  Procedure Laterality Date  . COLONOSCOPY W/ POLYPECTOMY  01-21-2008  . ESOPHAGOGASTRODUODENOSCOPY  11-15-2008  . KNEE ARTHROSCOPY Right X4  last one 2005  . KNEE ARTHROSCOPY Right 12/06/2013   Procedure: ARTHROSCOPY RIGHT KNEE WITH DEBRIDMENT AND PARTIAL MEDIAL AND LATERAL MENISCECTOMY AND CHONDRLPLASTY;  Surgeon: [name], MD;  Location: [facility];  Service: Orthopedics;  Laterality: Right;  . LUMBAR DISC SURGERY  04-02-2007   L5 -- S1  . NEGATIVE SLEEP STUDY  per pt  . PARATHYROIDECTOMY  1989   removal adenoma    FAMILY HISTORY Family History  Problem Relation Age of Onset  . Hyperlipidemia Mother   .  Coronary artery disease Mother   . Pulmonary embolism Father   . Colon cancer Father   . Hyperlipidemia Brother   . Pancreatic cancer Brother     SOCIAL HISTORY Social History   Tobacco Use  . Smoking status: Never Smoker  . Smokeless tobacco: Never Used  Substance Use Topics  . Alcohol use: Yes    Alcohol/week: 0.0 standard drinks    Comment: social use  . Drug use: No         OPHTHALMIC EXAM:  Base Eye Exam    Visual Acuity (Snellen - Linear)      Right Left   Dist [place] 20/400    Dist cc  20/20-2   Dist ph [place] NI        Tonometry (Tonopen, 8:13 AM)      Right Left   Pressure 12 14       Neuro/Psych    Oriented x3: Yes   Mood/Affect: Normal       Dilation    Right eye: 1.0% Mydriacyl, 2.5% Phenylephrine @ 8:13 AM        Slit Lamp and Fundus Exam    External Exam      Right Left   External Normal Normal       Slit Lamp Exam      Right Left   Lids/Lashes Normal Normal   Conjunctiva/Sclera White and quiet White and quiet   Cornea Clear Clear   Anterior Chamber Deep and quiet Deep and quiet   Iris Round and reactive Round and reactive   Lens Posterior chamber intraocular lens    Anterior Vitreous Normal Normal       Fundus Exam      Right Left   Posterior Vitreous Clear, silicone oil    Disc Normal    C/D Ratio 0.25    Macula Epiretinal membrane,,,,severe topo distortion    Vessels Normal    Periphery Attached,,good pexy inferiorly, good buckle, tire is temporal, band 360           IMAGING AND PROCEDURES  Imaging and Procedures for 07/14/19  OCT, Retina - OU - Both Eyes       Right Eye Quality was good. Scan locations included subfoveal. Findings include abnormal foveal contour.   Left Eye Quality was good. Scan locations included subfoveal. Findings include normal observations.   Notes OD with severe macular topographic distortion retinal thickening and intrinsic retinal edema.       Color Fundus Photography Optos - OU - Both  Eyes       Right Eye Progression has been stable. Disc findings include normal observations.   Notes OD, with severe  macular topographic distortion with maturing epiretinal membrane. The retina is attached. Good retinopexy At the site of the retinotomy inferiorly                ASSESSMENT/PLAN:  Macular pucker, right eye The nature of macular pucker (epiretinal membrane ERM) was discussed with the patient as well as threshold criteria for vitrectomy surgery. I explained that in rare cases another surgery is needed to actually remove a second wrinkle should it regrow.  Most often, the epiretinal membrane and underlying wrinkled internal limiting membrane are removed with the first surgery, to accomplish the goals.   If the operative eye is [place] (natural lens still present), cataract surgery is often recommended prior to Vitrectomy. This will enable the retina surgeon to have the best view during surgery and the patient to obtain optimal results in the future. Treatment options were discussed.  OD, with maturing epiretinal membrane. On color photos and clinical examination prominent epiretinal tissue in addition to internal limiting membrane distortion of the foveal region. Retina is attached. In the midst of this form of PVR, will need surgical intervention to remove the macular topographic distortion via internal limiting membrane peel and epiretinal membrane peel without removal of silicone oil. Will hasten normal return of macular topography and allow for visual acuity to stabilize and improve.      ICD-10-CM   1. Macular pucker, right eye  H35.371 OCT, Retina - OU - Both Eyes    Color Fundus Photography Optos - OU - Both Eyes    1. Discussed with patient that a physical need for repeat surgical intervention to remove the topographic distortion of the epiretinal membrane on the macular features so as to allow for visual acuity stabilization and potential recovery as normal  topography is restored under the oil. Pre op completed OD today. Pt questions were answered.   2.   3.  Ophthalmic Meds Ordered this visit:  No orders of the defined types were placed in this encounter.      Return in about 2 weeks (around 07/28/2019) for Schedule vitrectomy, membrane peel under silicone oil, 2 port.  There are no Patient Instructions on file for this visit.   Explained the diagnoses, plan, and follow up with the patient and they expressed understanding.  Patient expressed understanding of the importance of proper follow up care.   Clent Demark Burgandy Hackworth M.D. Diseases & Surgery of the Retina and Vitreous [facility]ina [facility]abetic [place] 07/14/19     Abbreviations: M myopia (nearsighted); A astigmatism; H hyperopia (farsighted); P presbyopia; Mrx spectacle prescription;  CTL contact lenses; OD right eye; OS left eye; OU both eyes  XT exotropia; ET esotropia; PEK punctate epithelial keratitis; PEE punctate epithelial erosions; DES dry eye syndrome; MGD meibomian gland dysfunction; ATs artificial tears; PFAT's preservative free artificial tears; [place] nuclear sclerotic cataract; PSC posterior subcapsular cataract; ERM epi-retinal membrane; PVD posterior vitreous detachment; RD retinal detachment; DM diabetes mellitus; DR diabetic retinopathy; NPDR non-proliferative diabetic retinopathy; PDR proliferative diabetic retinopathy; CSME clinically significant macular edema; DME diabetic macular edema; dbh dot blot hemorrhages; CWS cotton wool spot; POAG primary open angle glaucoma; C/D cup-to-disc ratio; HVF humphrey visual field; GVF goldmann visual field; OCT optical coherence tomography; IOP intraocular pressure; BRVO Branch retinal vein occlusion; CRVO central retinal vein occlusion; CRAO central retinal artery occlusion; BRAO branch retinal artery occlusion; RT retinal tear; SB scleral buckle; PPV pars plana vitrectomy; VH Vitreous hemorrhage; PRP panretinal laser photocoagulation; IVK  intravitreal kenalog; VMT vitreomacular traction; MH  Macular hole;  NVD neovascularization of the disc; NVE neovascularization elsewhere; AREDS age related eye disease study; ARMD age related macular degeneration; POAG primary open angle glaucoma; EBMD epithelial/anterior basement membrane dystrophy; ACIOL anterior chamber intraocular lens; IOL intraocular lens; PCIOL posterior chamber intraocular lens; Phaco/IOL phacoemulsification with intraocular lens placement; [place] photorefractive keratectomy; LASIK laser assisted in situ keratomileusis; HTN hypertension; DM diabetes mellitus; COPD chronic obstructive pulmonary disease

## 2019-07-27 ENCOUNTER — Encounter (INDEPENDENT_AMBULATORY_CARE_PROVIDER_SITE_OTHER): Payer: Federal, State, Local not specified - PPO | Admitting: Ophthalmology

## 2019-07-27 DIAGNOSIS — H35371 Puckering of macula, right eye: Secondary | ICD-10-CM | POA: Diagnosis not present

## 2019-07-28 ENCOUNTER — Encounter (INDEPENDENT_AMBULATORY_CARE_PROVIDER_SITE_OTHER): Payer: Self-pay | Admitting: Ophthalmology

## 2019-07-28 ENCOUNTER — Other Ambulatory Visit: Payer: Self-pay

## 2019-07-28 ENCOUNTER — Ambulatory Visit (INDEPENDENT_AMBULATORY_CARE_PROVIDER_SITE_OTHER): Payer: Federal, State, Local not specified - PPO | Admitting: Ophthalmology

## 2019-07-28 DIAGNOSIS — Z09 Encounter for follow-up examination after completed treatment for conditions other than malignant neoplasm: Secondary | ICD-10-CM

## 2019-07-28 DIAGNOSIS — H35371 Puckering of macula, right eye: Secondary | ICD-10-CM

## 2019-07-28 NOTE — Assessment & Plan Note (Signed)
Postop day #1, resection of severe macular pucker from severe epiretinal membrane.  At time of surgery subretinal fluid was found from tractional detachment induced reopening of breaks inferiorly.  The retinotomy site was still nicely closed.  Post removal of the epiretinal membrane, subretinal fluid was drained through the inferior breaks.  This allowed for laser retinopexy to be applied inferiorly.  No new retinal breaks were determined.

## 2019-07-28 NOTE — Progress Notes (Signed)
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  Discomfort includes pain.  Vision is stable.  I, the attending physician,  performed the HPI with the patient and updated documentation appropriately.          Comments    1 Day s\p Membrane Peel OD.  Pt states he had some pain early last night. Pt took Advil and stated that helped. Pt needs refill on Oflox. Pt used gtts this AM. Pt will see Dr. Gershon Crane this PM       Last edited by Tilda Franco on 07/28/2019  8:38 AM. (History)      Referring physician: [name], [name], DO [address] [place],  [place] [address]  HISTORICAL INFORMATION:   Selected notes from the MEDICAL RECORD NUMBER    Lab Results  Component Value Date   HGBA1C  12/09/2006    5.6 (NOTE)   The ADA recommends the following therapeutic goals for glycemic   control related to Hgb A1C measurement:   Goal of Therapy:   < 7.0% Hgb A1C   Action Suggested:  > 8.0% Hgb A1C   Ref:  Diabetes Care, 22, Suppl. 1, 1999     CURRENT MEDICATIONS: Current Outpatient Medications (Ophthalmic Drugs)  Medication Sig   ofloxacin (OCUFLOX) 0.3 % ophthalmic solution Place 1 drop into the right eye 4 (four) times daily.   prednisoLONE acetate (PRED FORTE) 1 % ophthalmic suspension SHAKE LIQUID AND INSTILL 1 DROP IN RIGHT EYE FOUR TIMES DAILY   No current facility-administered medications for this visit. (Ophthalmic Drugs)   Current Outpatient Medications (Other)  Medication Sig   albuterol (VENTOLIN HFA) 108 (90 Base) MCG/ACT inhaler Inhale 2 puffs into the lungs every 6 (six) hours as needed for wheezing or shortness of breath.   ALBUTEROL IN Inhale into the lungs as needed.   Albuterol Sulfate 2.5 MG/0.5ML NEBU Inhale into the lungs. 1 nebule every 6 hours as needed    atorvastatin (LIPITOR) 40 MG tablet TAKE 1 TABLET EVERY MORNING   BREO ELLIPTA 100-25 MCG/INH AEPB USE 1 INHALATION ORALLY    DAILY   Cholecalciferol (VITAMIN D) 50 MCG (2000 UT) tablet Take 2,000 Units by mouth daily.   Coenzyme Q10 (CO Q 10 PO) Take 1 tablet by mouth daily.   Multiple Vitamin (MULTIVITAMIN) tablet Take 1 tablet by mouth every morning.    nebivolol (BYSTOLIC) 10 MG tablet Take 1 tablet (10 mg total) by mouth daily.   olmesartan-hydrochlorothiazide (BENICAR HCT) 40-12.5 MG tablet TAKE 1 TABLET DAILY   omeprazole (PRILOSEC) 40 MG capsule Take 1 capsule (40 mg total) by mouth daily.   pantoprazole (PROTONIX) 40 MG tablet TAKE 1 TABLET BY MOUTH ONCE DAILY (Patient not taking: Reported on 04/08/2019)   silodosin (RAPAFLO) 8 MG CAPS capsule Take 8 mg by mouth at bedtime.   solifenacin (VESICARE) 5 MG tablet Take 1 tablet (5 mg total) by mouth daily.   No current facility-administered medications for this visit. (Other)      REVIEW OF SYSTEMS:    ALLERGIES Allergies  Allergen Reactions   Merthiolate [Thimerosal] Rash    Rash--local    PAST MEDICAL HISTORY Past Medical History:  Diagnosis Date   BPH (benign prostatic hypertrophy)  GERD (gastroesophageal reflux disease)    H/O hiatal hernia    History of colon polyps    Hyperlipidemia    Hypertension    Knee internal derangement    RIGHT   Mild asthma    Vitamin D deficiency    Wears glasses    Past Surgical History:  Procedure Laterality Date   COLONOSCOPY W/ POLYPECTOMY  01-21-2008   ESOPHAGOGASTRODUODENOSCOPY  11-15-2008   KNEE ARTHROSCOPY Right X4  last one 2005   KNEE ARTHROSCOPY Right 12/06/2013   Procedure: ARTHROSCOPY RIGHT KNEE WITH DEBRIDMENT AND PARTIAL MEDIAL AND LATERAL MENISCECTOMY AND CHONDRLPLASTY;  Surgeon: [name], MD;  Location: [facility];  Service: Orthopedics;  Laterality: Right;   LUMBAR DISC SURGERY  04-02-2007   L5 -- S1   NEGATIVE  SLEEP STUDY  per pt   PARATHYROIDECTOMY  1989   removal adenoma    FAMILY HISTORY Family History  Problem Relation Age of Onset   Hyperlipidemia Mother    Coronary artery disease Mother    Pulmonary embolism Father    Colon cancer Father    Hyperlipidemia Brother    Pancreatic cancer Brother     SOCIAL HISTORY Social History   Tobacco Use   Smoking status: Never Smoker   Smokeless tobacco: Never Used  Substance Use Topics   Alcohol use: Yes    Alcohol/week: 0.0 standard drinks    Comment: social use   Drug use: No         OPHTHALMIC EXAM:  Base Eye Exam    Visual Acuity (Snellen - Linear)      Right Left   Dist [place] 20/200    Dist cc  20/20 -1   Dist ph [place] 20/100 -1        Tonometry (Tonopen, 8:38 AM)      Right Left   Pressure 9 12       Pupils      Dark Light Shape React   Right 5 5 Round Dilated   Left           Neuro/Psych    Oriented x3: Yes   Mood/Affect: Normal        Slit Lamp and Fundus Exam    External Exam      Right Left   External Normal Normal       Slit Lamp Exam      Right Left   Lids/Lashes Normal Normal   Conjunctiva/Sclera White and quiet White and quiet   Cornea Clear Clear   Anterior Chamber Deep and quiet Deep and quiet   Iris Round and reactive Round and reactive   Lens Posterior chamber intraocular lens    Anterior Vitreous Normal Normal       Fundus Exam      Right Left   Posterior Vitreous Clear, silicone oil    Disc Normal    C/D Ratio 0.25    Macula Normal,,  no topo distortion    Vessels Normal    Periphery Attached,,good pexy inferiorly, good buckle, tire is temporal, band 360,, good pexy ,, no subretinal fluid           IMAGING AND PROCEDURES  Imaging and Procedures for 07/28/19           ASSESSMENT/PLAN:  Macular pucker, right eye Postop day #1, resection of severe macular pucker from severe epiretinal membrane.  At time of surgery subretinal fluid was found from tractional  detachment induced reopening of breaks inferiorly.  The retinotomy  site was still nicely closed.  Post removal of the epiretinal membrane, subretinal fluid was drained through the inferior breaks.  This allowed for laser retinopexy to be applied inferiorly.  No new retinal breaks were determined.      ICD-10-CM   1. Follow-up examination after eye surgery  Z09   2. Macular pucker, right eye  H35.371     1.  2.  3.  Ophthalmic Meds Ordered this visit:  No orders of the defined types were placed in this encounter.      Return in about 1 week (around 08/04/2019) for POST OP, OD, OCT.  There are no Patient Instructions on file for this visit.   Explained the diagnoses, plan, and follow up with the patient and they expressed understanding.  Patient expressed understanding of the importance of proper follow up care.   Clent Demark Kimley Apsey M.D. Diseases & Surgery of the Retina and Vitreous Retina & Diabetic [place] 07/28/19     Abbreviations: M myopia (nearsighted); A astigmatism; H hyperopia (farsighted); P presbyopia; Mrx spectacle prescription;  CTL contact lenses; OD right eye; OS left eye; OU both eyes  XT exotropia; ET esotropia; PEK punctate epithelial keratitis; PEE punctate epithelial erosions; DES dry eye syndrome; MGD meibomian gland dysfunction; ATs artificial tears; PFAT's preservative free artificial tears; [place] nuclear sclerotic cataract; PSC posterior subcapsular cataract; ERM epi-retinal membrane; PVD posterior vitreous detachment; RD retinal detachment; DM diabetes mellitus; DR diabetic retinopathy; NPDR non-proliferative diabetic retinopathy; PDR proliferative diabetic retinopathy; CSME clinically significant macular edema; DME diabetic macular edema; dbh dot blot hemorrhages; CWS cotton wool spot; POAG primary open angle glaucoma; C/D cup-to-disc ratio; HVF humphrey visual field; GVF goldmann visual field; OCT optical coherence tomography; IOP intraocular pressure; BRVO  Branch retinal vein occlusion; CRVO central retinal vein occlusion; CRAO central retinal artery occlusion; BRAO branch retinal artery occlusion; RT retinal tear; SB scleral buckle; PPV pars plana vitrectomy; VH Vitreous hemorrhage; PRP panretinal laser photocoagulation; IVK intravitreal kenalog; VMT vitreomacular traction; MH Macular hole;  NVD neovascularization of the disc; NVE neovascularization elsewhere; AREDS age related eye disease study; ARMD age related macular degeneration; POAG primary open angle glaucoma; EBMD epithelial/anterior basement membrane dystrophy; ACIOL anterior chamber intraocular lens; IOL intraocular lens; PCIOL posterior chamber intraocular lens; Phaco/IOL phacoemulsification with intraocular lens placement; [place] photorefractive keratectomy; LASIK laser assisted in situ keratomileusis; HTN hypertension; DM diabetes mellitus; COPD chronic obstructive pulmonary disease

## 2019-07-29 ENCOUNTER — Other Ambulatory Visit (INDEPENDENT_AMBULATORY_CARE_PROVIDER_SITE_OTHER): Payer: Self-pay | Admitting: Ophthalmology

## 2019-07-29 DIAGNOSIS — H33011 Retinal detachment with single break, right eye: Secondary | ICD-10-CM

## 2019-08-04 ENCOUNTER — Other Ambulatory Visit: Payer: Self-pay

## 2019-08-04 ENCOUNTER — Ambulatory Visit (INDEPENDENT_AMBULATORY_CARE_PROVIDER_SITE_OTHER): Payer: Federal, State, Local not specified - PPO | Admitting: Ophthalmology

## 2019-08-04 ENCOUNTER — Encounter (INDEPENDENT_AMBULATORY_CARE_PROVIDER_SITE_OTHER): Payer: Self-pay | Admitting: Ophthalmology

## 2019-08-04 DIAGNOSIS — Z09 Encounter for follow-up examination after completed treatment for conditions other than malignant neoplasm: Secondary | ICD-10-CM

## 2019-08-04 DIAGNOSIS — H35371 Puckering of macula, right eye: Secondary | ICD-10-CM

## 2019-08-04 NOTE — Progress Notes (Signed)
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  Discomfort includes pain and itching.  Vision is stable.  I, the attending physician,  performed the HPI with the patient and updated documentation appropriately.          Comments    1 week PO OD - Membrane peel Patient states that he has some itching and the eye is sore. Patient states he still has double vision.       Last edited by Gerda Diss on 08/04/2019  8:28 AM. (History)      Referring physician: [name], [name], DO [address] [place],  [place] [address]  HISTORICAL INFORMATION:   Selected notes from the MEDICAL RECORD NUMBER    Lab Results  Component Value Date   HGBA1C  12/09/2006    5.6 (NOTE)   The ADA recommends the following therapeutic goals for glycemic   control related to Hgb A1C measurement:   Goal of Therapy:   < 7.0% Hgb A1C   Action Suggested:  > 8.0% Hgb A1C   Ref:  Diabetes Care, 22, Suppl. 1, 1999     CURRENT MEDICATIONS: Current Outpatient Medications (Ophthalmic Drugs)  Medication Sig  . ofloxacin (OCUFLOX) 0.3 % ophthalmic solution Place 1 drop into the right eye 4 (four) times daily.  . prednisoLONE acetate (PRED FORTE) 1 % ophthalmic suspension SHAKE LIQUID AND INSTILL 1 DROP IN RIGHT EYE FOUR TIMES DAILY   No current facility-administered medications for this visit. (Ophthalmic Drugs)   Current Outpatient Medications (Other)  Medication Sig  . albuterol (VENTOLIN HFA) 108 (90 Base) MCG/ACT inhaler Inhale 2 puffs into the lungs every 6 (six) hours as needed for wheezing or shortness of breath.  . ALBUTEROL IN Inhale into the lungs as needed.  . Albuterol Sulfate 2.5 MG/0.5ML NEBU Inhale into the lungs. 1 nebule every 6 hours as needed  . atorvastatin (LIPITOR) 40 MG tablet TAKE 1 TABLET EVERY  MORNING  . BREO ELLIPTA 100-25 MCG/INH AEPB USE 1 INHALATION ORALLY    DAILY  . Cholecalciferol (VITAMIN D) 50 MCG (2000 UT) tablet Take 2,000 Units by mouth daily.  . Coenzyme Q10 (CO Q 10 PO) Take 1 tablet by mouth daily.  . Multiple Vitamin (MULTIVITAMIN) tablet Take 1 tablet by mouth every morning.   . nebivolol (BYSTOLIC) 10 MG tablet Take 1 tablet (10 mg total) by mouth daily.  Marland Kitchen olmesartan-hydrochlorothiazide (BENICAR HCT) 40-12.5 MG tablet TAKE 1 TABLET DAILY  . omeprazole (PRILOSEC) 40 MG capsule Take 1 capsule (40 mg total) by mouth daily.  . pantoprazole (PROTONIX) 40 MG tablet TAKE 1 TABLET BY MOUTH ONCE DAILY (Patient not taking: Reported on 04/08/2019)  . silodosin (RAPAFLO) 8 MG CAPS capsule Take 8 mg by mouth at bedtime.  . solifenacin (VESICARE) 5 MG tablet Take 1 tablet (5 mg total) by mouth daily.   No current facility-administered medications for this visit. (Other)      REVIEW OF SYSTEMS:    ALLERGIES Allergies  Allergen Reactions  . Merthiolate [Thimerosal] Rash    Rash--local    PAST MEDICAL HISTORY Past Medical History:  Diagnosis Date  . BPH (benign prostatic hypertrophy)   . GERD (gastroesophageal reflux disease)   . H/O hiatal hernia   .  History of colon polyps   . Hyperlipidemia   . Hypertension   . Knee internal derangement    RIGHT  . Mild asthma   . Vitamin D deficiency   . Wears glasses    Past Surgical History:  Procedure Laterality Date  . COLONOSCOPY W/ POLYPECTOMY  01-21-2008  . ESOPHAGOGASTRODUODENOSCOPY  11-15-2008  . KNEE ARTHROSCOPY Right X4  last one 2005  . KNEE ARTHROSCOPY Right 12/06/2013   Procedure: ARTHROSCOPY RIGHT KNEE WITH DEBRIDMENT AND PARTIAL MEDIAL AND LATERAL MENISCECTOMY AND CHONDRLPLASTY;  Surgeon: [name], MD;  Location: [facility];  Service: Orthopedics;  Laterality: Right;  . LUMBAR DISC SURGERY  04-02-2007   L5 -- S1  . NEGATIVE SLEEP STUDY  per pt  . PARATHYROIDECTOMY  1989    removal adenoma    FAMILY HISTORY Family History  Problem Relation Age of Onset  . Hyperlipidemia Mother   . Coronary artery disease Mother   . Pulmonary embolism Father   . Colon cancer Father   . Hyperlipidemia Brother   . Pancreatic cancer Brother     SOCIAL HISTORY Social History   Tobacco Use  . Smoking status: Never Smoker  . Smokeless tobacco: Never Used  Substance Use Topics  . Alcohol use: Yes    Alcohol/week: 0.0 standard drinks    Comment: social use  . Drug use: No         OPHTHALMIC EXAM:  Base Eye Exam    Visual Acuity (Snellen - Linear)      Right Left   Dist [place] 20/100-2    Dist cc  20/20   Dist ph  20/70-1        Tonometry (Tonopen, 8:31 AM)      Right Left   Pressure 8 11       Neuro/Psych    Oriented x3: Yes   Mood/Affect: Normal       Dilation    Right eye: 1.0% Mydriacyl, 2.5% Phenylephrine @ 8:31 AM        Slit Lamp and Fundus Exam    External Exam      Right Left   External Normal Normal       Slit Lamp Exam      Right Left   Lids/Lashes Normal Normal   Conjunctiva/Sclera White and quiet White and quiet   Cornea Clear Clear   Anterior Chamber Deep and quiet, no silicone Deep and quiet   Iris Round and reactive Round and reactive   Lens Posterior chamber intraocular lens    Anterior Vitreous Normal Normal       Fundus Exam      Right Left   Posterior Vitreous Clear, silicone oil    Disc Normal    C/D Ratio 0.25    Macula Normal,,  no topo distortion    Vessels Normal    Periphery Attached,,good pexy inferiorly, good buckle, tire is temporal, band 360,, good pexy ,, no subretinal fluid           IMAGING AND PROCEDURES  Imaging and Procedures for 08/04/19  OCT, Retina - OU - Both Eyes       Right Eye Quality was good. Scan locations included subfoveal. Central Foveal Thickness: 436. Progression has improved.   Left Eye Quality was good. Scan locations included subfoveal. Central Foveal Thickness: 292.  Progression has been stable. Findings include normal observations.   Notes OD, with much less subretinal fluid, much less retinal thickening and no intraretinal fluid nor CME.  Now a less than 10 days status post vitrectomy, membrane peel of severe epiretinal membrane and internal limiting membrane history of triggering a tractional retinal detachment overlying the fovea.  Vastly improved acuity and anatomy.                ASSESSMENT/PLAN:  No problem-specific Assessment & Plan notes found for this encounter.      ICD-10-CM   1. Follow-up examination after eye surgery  Z09 OCT, Retina - OU - Both Eyes    1.  Patient to continue ofloxacin 4 times daily OD until current bottle medication is complete.  2.  Is to continue on topical Pred forte OD 4 times daily until follow-up in 2 weeks.  3.  Ophthalmic Meds Ordered this visit:  No orders of the defined types were placed in this encounter.      Return in about 2 weeks (around 08/18/2019) for POST OP, OD, OCT.  There are no Patient Instructions on file for this visit.   Explained the diagnoses, plan, and follow up with the patient and they expressed understanding.  Patient expressed understanding of the importance of proper follow up care.   Clent Demark Zolton Dowson M.D. Diseases & Surgery of the Retina and Vitreous Retina & Diabetic [place] 08/04/19     Abbreviations: M myopia (nearsighted); A astigmatism; H hyperopia (farsighted); P presbyopia; Mrx spectacle prescription;  CTL contact lenses; OD right eye; OS left eye; OU both eyes  XT exotropia; ET esotropia; PEK punctate epithelial keratitis; PEE punctate epithelial erosions; DES dry eye syndrome; MGD meibomian gland dysfunction; ATs artificial tears; PFAT's preservative free artificial tears; [place] nuclear sclerotic cataract; PSC posterior subcapsular cataract; ERM epi-retinal membrane; PVD posterior vitreous detachment; RD retinal detachment; DM diabetes mellitus; DR  diabetic retinopathy; NPDR non-proliferative diabetic retinopathy; PDR proliferative diabetic retinopathy; CSME clinically significant macular edema; DME diabetic macular edema; dbh dot blot hemorrhages; CWS cotton wool spot; POAG primary open angle glaucoma; C/D cup-to-disc ratio; HVF humphrey visual field; GVF goldmann visual field; OCT optical coherence tomography; IOP intraocular pressure; BRVO Branch retinal vein occlusion; CRVO central retinal vein occlusion; CRAO central retinal artery occlusion; BRAO branch retinal artery occlusion; RT retinal tear; SB scleral buckle; PPV pars plana vitrectomy; VH Vitreous hemorrhage; PRP panretinal laser photocoagulation; IVK intravitreal kenalog; VMT vitreomacular traction; MH Macular hole;  NVD neovascularization of the disc; NVE neovascularization elsewhere; AREDS age related eye disease study; ARMD age related macular degeneration; POAG primary open angle glaucoma; EBMD epithelial/anterior basement membrane dystrophy; ACIOL anterior chamber intraocular lens; IOL intraocular lens; PCIOL posterior chamber intraocular lens; Phaco/IOL phacoemulsification with intraocular lens placement; [place] photorefractive keratectomy; LASIK laser assisted in situ keratomileusis; HTN hypertension; DM diabetes mellitus; COPD chronic obstructive pulmonary disease

## 2019-08-09 ENCOUNTER — Ambulatory Visit
Admission: RE | Admit: 2019-08-09 | Discharge: 2019-08-09 | Disposition: A | Discharge status: Home / Self Care | Payer: Federal, State, Local not specified - PPO | Source: Ambulatory Visit | Attending: Urology | Admitting: Urology

## 2019-08-09 ENCOUNTER — Other Ambulatory Visit: Payer: Self-pay

## 2019-08-09 DIAGNOSIS — C61 Malignant neoplasm of prostate: Secondary | ICD-10-CM

## 2019-08-09 MED ORDER — GADOBENATE DIMEGLUMINE 529 MG/ML IV SOLN
20.0000 mL | Freq: Once | INTRAVENOUS | Status: AC | PRN
  Administered 2019-08-09: 20 mL via INTRAVENOUS

## 2019-08-12 DIAGNOSIS — C61 Malignant neoplasm of prostate: Secondary | ICD-10-CM | POA: Diagnosis not present

## 2019-08-12 DIAGNOSIS — N401 Enlarged prostate with lower urinary tract symptoms: Secondary | ICD-10-CM | POA: Diagnosis not present

## 2019-08-18 ENCOUNTER — Encounter (INDEPENDENT_AMBULATORY_CARE_PROVIDER_SITE_OTHER): Payer: Federal, State, Local not specified - PPO | Admitting: Ophthalmology

## 2019-08-18 ENCOUNTER — Ambulatory Visit (INDEPENDENT_AMBULATORY_CARE_PROVIDER_SITE_OTHER): Payer: Federal, State, Local not specified - PPO | Admitting: Ophthalmology

## 2019-08-18 ENCOUNTER — Encounter (INDEPENDENT_AMBULATORY_CARE_PROVIDER_SITE_OTHER): Payer: Self-pay | Admitting: Ophthalmology

## 2019-08-18 ENCOUNTER — Other Ambulatory Visit: Payer: Self-pay

## 2019-08-18 DIAGNOSIS — H35371 Puckering of macula, right eye: Secondary | ICD-10-CM

## 2019-08-18 NOTE — Progress Notes (Signed)
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  Discomfort includes Negative for pain and itching.  Vision is stable.          Comments    2 week follow up - PO OD, OCT OU Patient denies change in vision and overall has no complaints.        Last edited by Gerda Diss on 08/18/2019  4:01 PM. (History)      Referring physician: [name], [name], DO [address] [place],  [place] [address]  HISTORICAL INFORMATION:   Selected notes from the MEDICAL RECORD NUMBER    Lab Results  Component Value Date   HGBA1C  12/09/2006    5.6 (NOTE)   The ADA recommends the following therapeutic goals for glycemic   control related to Hgb A1C measurement:   Goal of Therapy:   < 7.0% Hgb A1C   Action Suggested:  > 8.0% Hgb A1C   Ref:  Diabetes Care, 22, Suppl. 1, 1999     CURRENT MEDICATIONS: Current Outpatient Medications (Ophthalmic Drugs)  Medication Sig  . ofloxacin (OCUFLOX) 0.3 % ophthalmic solution Place 1 drop into the right eye 4 (four) times daily.  . prednisoLONE acetate (PRED FORTE) 1 % ophthalmic suspension SHAKE LIQUID AND INSTILL 1 DROP IN RIGHT EYE FOUR TIMES DAILY   No current facility-administered medications for this visit. (Ophthalmic Drugs)   Current Outpatient Medications (Other)  Medication Sig  . albuterol (VENTOLIN HFA) 108 (90 Base) MCG/ACT inhaler Inhale 2 puffs into the lungs every 6 (six) hours as needed for wheezing or shortness of breath.  . ALBUTEROL IN Inhale into the lungs as needed.  . Albuterol Sulfate 2.5 MG/0.5ML NEBU Inhale into the lungs. 1 nebule every 6 hours as needed  . atorvastatin (LIPITOR) 40 MG tablet TAKE 1 TABLET EVERY MORNING  . BREO ELLIPTA 100-25 MCG/INH AEPB USE 1 INHALATION ORALLY    DAILY  . Cholecalciferol (VITAMIN D) 50 MCG (2000 UT)  tablet Take 2,000 Units by mouth daily.  . Coenzyme Q10 (CO Q 10 PO) Take 1 tablet by mouth daily.  . Multiple Vitamin (MULTIVITAMIN) tablet Take 1 tablet by mouth every morning.   . nebivolol (BYSTOLIC) 10 MG tablet Take 1 tablet (10 mg total) by mouth daily.  Marland Kitchen olmesartan-hydrochlorothiazide (BENICAR HCT) 40-12.5 MG tablet TAKE 1 TABLET DAILY  . omeprazole (PRILOSEC) 40 MG capsule Take 1 capsule (40 mg total) by mouth daily.  . pantoprazole (PROTONIX) 40 MG tablet TAKE 1 TABLET BY MOUTH ONCE DAILY (Patient not taking: Reported on 04/08/2019)  . silodosin (RAPAFLO) 8 MG CAPS capsule Take 8 mg by mouth at bedtime.  . solifenacin (VESICARE) 5 MG tablet Take 1 tablet (5 mg total) by mouth daily.   No current facility-administered medications for this visit. (Other)      REVIEW OF SYSTEMS:    ALLERGIES Allergies  Allergen Reactions  . Merthiolate [Thimerosal] Rash    Rash--local    PAST MEDICAL HISTORY Past Medical History:  Diagnosis Date  . BPH (benign prostatic hypertrophy)   . GERD (gastroesophageal reflux disease)   . H/O hiatal hernia   . History of colon polyps   . Hyperlipidemia   . Hypertension   . Knee internal derangement  RIGHT  . Mild asthma   . Vitamin D deficiency   . Wears glasses    Past Surgical History:  Procedure Laterality Date  . COLONOSCOPY W/ POLYPECTOMY  01-21-2008  . ESOPHAGOGASTRODUODENOSCOPY  11-15-2008  . KNEE ARTHROSCOPY Right X4  last one 2005  . KNEE ARTHROSCOPY Right 12/06/2013   Procedure: ARTHROSCOPY RIGHT KNEE WITH DEBRIDMENT AND PARTIAL MEDIAL AND LATERAL MENISCECTOMY AND CHONDRLPLASTY;  Surgeon: [name], MD;  Location: [facility];  Service: Orthopedics;  Laterality: Right;  . LUMBAR DISC SURGERY  04-02-2007   L5 -- S1  . NEGATIVE SLEEP STUDY  per pt  . PARATHYROIDECTOMY  1989   removal adenoma    FAMILY HISTORY Family History  Problem Relation Age of Onset  . Hyperlipidemia Mother   . Coronary artery  disease Mother   . Pulmonary embolism Father   . Colon cancer Father   . Hyperlipidemia Brother   . Pancreatic cancer Brother     SOCIAL HISTORY Social History   Tobacco Use  . Smoking status: Never Smoker  . Smokeless tobacco: Never Used  Substance Use Topics  . Alcohol use: Yes    Alcohol/week: 0.0 standard drinks    Comment: social use  . Drug use: No         OPHTHALMIC EXAM:  Base Eye Exam    Visual Acuity (Snellen - Linear)      Right Left   Dist [place] 20/100+1    Dist cc  20/20   Dist ph [place] 20/60        Tonometry (Tonopen, 4:04 PM)      Right Left   Pressure 9 10       Neuro/Psych    Oriented x3: Yes   Mood/Affect: Normal       Dilation    Right eye: 1.0% Mydriacyl, 2.5% Phenylephrine @ 4:04 PM        Slit Lamp and Fundus Exam    External Exam      Right Left   External Normal Normal       Slit Lamp Exam      Right Left   Lids/Lashes Normal Normal   Conjunctiva/Sclera White and quiet White and quiet   Cornea Clear Clear   Anterior Chamber Deep and quiet Deep and quiet   Iris Round and reactive Round and reactive   Lens Posterior chamber intraocular lens    Anterior Vitreous Normal Normal       Fundus Exam      Right Left   Posterior Vitreous Clear, silicone oil    Disc Normal    C/D Ratio 0.25    Macula Normal,,  no topo distortion    Vessels Normal    Periphery Attached,,good pexy inferiorly, good buckle, tire is temporal, band 360,, good pexy ,, no subretinal fluid           IMAGING AND PROCEDURES  Imaging and Procedures for 08/18/19  OCT, Retina - OU - Both Eyes       Right Eye Quality was good. Scan locations included subfoveal. Central Foveal Thickness: 372. Progression has improved. Findings include abnormal foveal contour.   Left Eye Quality was good. Scan locations included subfoveal. Central Foveal Thickness: 295. Findings include normal observations.   Notes Much worse central retinal thickening, small amount of  subretinal fluid.  Macular region.  OD                ASSESSMENT/PLAN:  No problem-specific Assessment & Plan notes  found for this encounter.      ICD-10-CM   1. Macular pucker, right eye  H35.371 OCT, Retina - OU - Both Eyes    1.  2.  3.  Ophthalmic Meds Ordered this visit:  No orders of the defined types were placed in this encounter.      Return in about 3 weeks (around 09/08/2019) for OD, dilate, OCT.  There are no Patient Instructions on file for this visit.   Explained the diagnoses, plan, and follow up with the patient and they expressed understanding.  Patient expressed understanding of the importance of proper follow up care.   Clent Demark Nocole Zammit M.D. Diseases & Surgery of the Retina and Vitreous Retina & Diabetic [place] 08/18/19     Abbreviations: M myopia (nearsighted); A astigmatism; H hyperopia (farsighted); P presbyopia; Mrx spectacle prescription;  CTL contact lenses; OD right eye; OS left eye; OU both eyes  XT exotropia; ET esotropia; PEK punctate epithelial keratitis; PEE punctate epithelial erosions; DES dry eye syndrome; MGD meibomian gland dysfunction; ATs artificial tears; PFAT's preservative free artificial tears; [place] nuclear sclerotic cataract; PSC posterior subcapsular cataract; ERM epi-retinal membrane; PVD posterior vitreous detachment; RD retinal detachment; DM diabetes mellitus; DR diabetic retinopathy; NPDR non-proliferative diabetic retinopathy; PDR proliferative diabetic retinopathy; CSME clinically significant macular edema; DME diabetic macular edema; dbh dot blot hemorrhages; CWS cotton wool spot; POAG primary open angle glaucoma; C/D cup-to-disc ratio; HVF humphrey visual field; GVF goldmann visual field; OCT optical coherence tomography; IOP intraocular pressure; BRVO Branch retinal vein occlusion; CRVO central retinal vein occlusion; CRAO central retinal artery occlusion; BRAO branch retinal artery occlusion; RT retinal tear; SB  scleral buckle; PPV pars plana vitrectomy; VH Vitreous hemorrhage; PRP panretinal laser photocoagulation; IVK intravitreal kenalog; VMT vitreomacular traction; MH Macular hole;  NVD neovascularization of the disc; NVE neovascularization elsewhere; AREDS age related eye disease study; ARMD age related macular degeneration; POAG primary open angle glaucoma; EBMD epithelial/anterior basement membrane dystrophy; ACIOL anterior chamber intraocular lens; IOL intraocular lens; PCIOL posterior chamber intraocular lens; Phaco/IOL phacoemulsification with intraocular lens placement; [place] photorefractive keratectomy; LASIK laser assisted in situ keratomileusis; HTN hypertension; DM diabetes mellitus; COPD chronic obstructive pulmonary disease

## 2019-09-01 ENCOUNTER — Encounter (INDEPENDENT_AMBULATORY_CARE_PROVIDER_SITE_OTHER): Payer: Federal, State, Local not specified - PPO | Admitting: Ophthalmology

## 2019-09-08 ENCOUNTER — Ambulatory Visit (INDEPENDENT_AMBULATORY_CARE_PROVIDER_SITE_OTHER): Payer: Federal, State, Local not specified - PPO | Admitting: Ophthalmology

## 2019-09-08 ENCOUNTER — Encounter (INDEPENDENT_AMBULATORY_CARE_PROVIDER_SITE_OTHER): Payer: Self-pay | Admitting: Ophthalmology

## 2019-09-08 ENCOUNTER — Other Ambulatory Visit: Payer: Self-pay

## 2019-09-08 DIAGNOSIS — H35371 Puckering of macula, right eye: Secondary | ICD-10-CM | POA: Diagnosis not present

## 2019-09-08 MED ORDER — LOTEPREDNOL ETABONATE 0.5 % OP SUSP
1.0000 [drp] | Freq: Two times a day (BID) | OPHTHALMIC | 3 refills | Status: DC
Start: 2019-09-08 — End: 2020-05-24

## 2019-09-08 NOTE — Progress Notes (Signed)
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  In right eye.  Duration of 3 weeks.  Since onset it is stable.          Comments    3 week PO OD- OCT OU Patient states overall his eye has been stable, still has double vision. However, he has noticed that since he has finished the Pred, that his eye feels more irritated.        Last edited by Gerda Diss on 09/08/2019  3:36 PM. (History)      Referring physician: [name], [name], DO [address] [place],  [place] [address]  HISTORICAL INFORMATION:   Selected notes from the MEDICAL RECORD NUMBER    Lab Results  Component Value Date   HGBA1C  12/09/2006    5.6 (NOTE)   The ADA recommends the following therapeutic goals for glycemic   control related to Hgb A1C measurement:   Goal of Therapy:   < 7.0% Hgb A1C   Action Suggested:  > 8.0% Hgb A1C   Ref:  Diabetes Care, 22, Suppl. 1, 1999     CURRENT MEDICATIONS: Current Outpatient Medications (Ophthalmic Drugs)  Medication Sig  . ofloxacin (OCUFLOX) 0.3 % ophthalmic solution Place 1 drop into the right eye 4 (four) times daily.  . prednisoLONE acetate (PRED FORTE) 1 % ophthalmic suspension SHAKE LIQUID AND INSTILL 1 DROP IN RIGHT EYE FOUR TIMES DAILY   No current facility-administered medications for this visit. (Ophthalmic Drugs)   Current Outpatient Medications (Other)  Medication Sig  . albuterol (VENTOLIN HFA) 108 (90 Base) MCG/ACT inhaler Inhale 2 puffs into the lungs every 6 (six) hours as needed for wheezing or shortness of breath.  . ALBUTEROL IN Inhale into the lungs as needed.  . Albuterol Sulfate 2.5 MG/0.5ML NEBU Inhale into the lungs. 1 nebule every 6 hours as needed  . atorvastatin (LIPITOR) 40 MG tablet TAKE 1 TABLET EVERY MORNING  . BREO ELLIPTA  100-25 MCG/INH AEPB USE 1 INHALATION ORALLY    DAILY  . Cholecalciferol (VITAMIN D) 50 MCG (2000 UT) tablet Take 2,000 Units by mouth daily.  . Coenzyme Q10 (CO Q 10 PO) Take 1 tablet by mouth daily.  . Multiple Vitamin (MULTIVITAMIN) tablet Take 1 tablet by mouth every morning.   . nebivolol (BYSTOLIC) 10 MG tablet Take 1 tablet (10 mg total) by mouth daily.  Marland Kitchen olmesartan-hydrochlorothiazide (BENICAR HCT) 40-12.5 MG tablet TAKE 1 TABLET DAILY  . omeprazole (PRILOSEC) 40 MG capsule Take 1 capsule (40 mg total) by mouth daily.  . pantoprazole (PROTONIX) 40 MG tablet TAKE 1 TABLET BY MOUTH ONCE DAILY (Patient not taking: Reported on 04/08/2019)  . silodosin (RAPAFLO) 8 MG CAPS capsule Take 8 mg by mouth at bedtime.  . solifenacin (VESICARE) 5 MG tablet Take 1 tablet (5 mg total) by mouth daily.   No current facility-administered medications for this visit. (Other)      REVIEW OF SYSTEMS:    ALLERGIES Allergies  Allergen Reactions  . Merthiolate [Thimerosal] Rash    Rash--local    PAST MEDICAL HISTORY Past Medical History:  Diagnosis Date  . BPH (benign prostatic hypertrophy)   . GERD (gastroesophageal reflux disease)   . H/O  hiatal hernia   . History of colon polyps   . Hyperlipidemia   . Hypertension   . Knee internal derangement    RIGHT  . Mild asthma   . Vitamin D deficiency   . Wears glasses    Past Surgical History:  Procedure Laterality Date  . COLONOSCOPY W/ POLYPECTOMY  01-21-2008  . ESOPHAGOGASTRODUODENOSCOPY  11-15-2008  . KNEE ARTHROSCOPY Right X4  last one 2005  . KNEE ARTHROSCOPY Right 12/06/2013   Procedure: ARTHROSCOPY RIGHT KNEE WITH DEBRIDMENT AND PARTIAL MEDIAL AND LATERAL MENISCECTOMY AND CHONDRLPLASTY;  Surgeon: [name], MD;  Location: [facility];  Service: Orthopedics;  Laterality: Right;  . LUMBAR DISC SURGERY  04-02-2007   L5 -- S1  . NEGATIVE SLEEP STUDY  per pt  . PARATHYROIDECTOMY  1989   removal adenoma    FAMILY  HISTORY Family History  Problem Relation Age of Onset  . Hyperlipidemia Mother   . Coronary artery disease Mother   . Pulmonary embolism Father   . Colon cancer Father   . Hyperlipidemia Brother   . Pancreatic cancer Brother     SOCIAL HISTORY Social History   Tobacco Use  . Smoking status: Never Smoker  . Smokeless tobacco: Never Used  Substance Use Topics  . Alcohol use: Yes    Alcohol/week: 0.0 standard drinks    Comment: social use  . Drug use: No         OPHTHALMIC EXAM:  Base Eye Exam    Visual Acuity (Snellen - Linear)      Right Left   Dist [place] 20/200    Dist cc  20/20   Dist ph [place] 20/60-2        Tonometry (Tonopen, 3:41 PM)      Right Left   Pressure 10 12       Pupils      Pupils Dark Light Shape React APD   Right PERRL 3 3 Round Minimal None   Left PERRL 4 3 Round Brisk None       Visual Fields (Counting fingers)      Left Right    Full Full       Extraocular Movement      Right Left    Full Full       Neuro/Psych    Oriented x3: Yes   Mood/Affect: Normal       Dilation    Right eye: 1.0% Mydriacyl, 2.5% Phenylephrine @ 3:41 PM        Slit Lamp and Fundus Exam    External Exam      Right Left   External Normal Normal       Slit Lamp Exam      Right Left   Lids/Lashes Normal Normal   Conjunctiva/Sclera White and quiet White and quiet   Cornea Clear Clear   Anterior Chamber Deep and quiet Deep and quiet   Iris Round and reactive Round and reactive   Lens Posterior chamber intraocular lens    Anterior Vitreous Normal Normal       Fundus Exam      Right Left   Posterior Vitreous Clear, silicone oil    Disc Normal    C/D Ratio 0.25    Macula Normal,,  no topo distortion    Vessels Normal    Periphery Attached,,good pexy inferiorly, good buckle, tire is temporal, band 360,, good pexy ,, new subretinal fluid, local loculated anterior to the equator between the 5 and 6:00  meridian.  Clear retinal hole but well surrounded  and laser demarcated chorioretinal scarring 360           IMAGING AND PROCEDURES  Imaging and Procedures for 09/08/19  OCT, Retina - OU - Both Eyes       Right Eye Quality was good. Scan locations included subfoveal. Central Foveal Thickness: 384. Findings include abnormal foveal contour.   Left Eye Quality was good. Scan locations included subfoveal. Central Foveal Thickness: 292. Progression has improved. Findings include normal observations.   Notes Irregular contour to the macula continues although vastly improved over the last 2 months from the most recent vitrectomy:/Membrane peel  Minor residual loculated subretinal fluid remains, this hopefully will continue with slow resolution.                  ASSESSMENT/PLAN:  No problem-specific Assessment & Plan notes found for this encounter.      ICD-10-CM   1. Macular pucker, right eye  H35.371 OCT, Retina - OU - Both Eyes    1.  2.  3.  Ophthalmic Meds Ordered this visit:  No orders of the defined types were placed in this encounter.      Return in about 3 weeks (around 09/29/2019) for OD, COLOR FP, dilate.  There are no Patient Instructions on file for this visit.   Explained the diagnoses, plan, and follow up with the patient and they expressed understanding.  Patient expressed understanding of the importance of proper follow up care.   Clent Demark Cesareo Vickrey M.D. Diseases & Surgery of the Retina and Vitreous Retina & Diabetic [employer] 09/08/19     Abbreviations: M myopia (nearsighted); A astigmatism; H hyperopia (farsighted); P presbyopia; Mrx spectacle prescription;  CTL contact lenses; OD right eye; OS left eye; OU both eyes  XT exotropia; ET esotropia; PEK punctate epithelial keratitis; PEE punctate epithelial erosions; DES dry eye syndrome; MGD meibomian gland dysfunction; ATs artificial tears; PFAT's preservative free artificial tears; [place] nuclear sclerotic cataract; PSC posterior subcapsular  cataract; ERM epi-retinal membrane; PVD posterior vitreous detachment; RD retinal detachment; DM diabetes mellitus; DR diabetic retinopathy; NPDR non-proliferative diabetic retinopathy; PDR proliferative diabetic retinopathy; CSME clinically significant macular edema; DME diabetic macular edema; dbh dot blot hemorrhages; CWS cotton wool spot; POAG primary open angle glaucoma; C/D cup-to-disc ratio; HVF humphrey visual field; GVF goldmann visual field; OCT optical coherence tomography; IOP intraocular pressure; BRVO Branch retinal vein occlusion; CRVO central retinal vein occlusion; CRAO central retinal artery occlusion; BRAO branch retinal artery occlusion; RT retinal tear; SB scleral buckle; PPV pars plana vitrectomy; VH Vitreous hemorrhage; PRP panretinal laser photocoagulation; IVK intravitreal kenalog; VMT vitreomacular traction; MH Macular hole;  NVD neovascularization of the disc; NVE neovascularization elsewhere; AREDS age related eye disease study; ARMD age related macular degeneration; POAG primary open angle glaucoma; EBMD epithelial/anterior basement membrane dystrophy; ACIOL anterior chamber intraocular lens; IOL intraocular lens; PCIOL posterior chamber intraocular lens; Phaco/IOL phacoemulsification with intraocular lens placement; [place] photorefractive keratectomy; LASIK laser assisted in situ keratomileusis; HTN hypertension; DM diabetes mellitus; COPD chronic obstructive pulmonary disease

## 2019-09-29 ENCOUNTER — Other Ambulatory Visit: Payer: Self-pay

## 2019-09-29 ENCOUNTER — Ambulatory Visit (INDEPENDENT_AMBULATORY_CARE_PROVIDER_SITE_OTHER): Payer: Federal, State, Local not specified - PPO | Admitting: Ophthalmology

## 2019-09-29 ENCOUNTER — Encounter (INDEPENDENT_AMBULATORY_CARE_PROVIDER_SITE_OTHER): Payer: Self-pay | Admitting: Ophthalmology

## 2019-09-29 DIAGNOSIS — H35371 Puckering of macula, right eye: Secondary | ICD-10-CM

## 2019-09-29 DIAGNOSIS — H3341 Traction detachment of retina, right eye: Secondary | ICD-10-CM

## 2019-09-29 NOTE — Assessment & Plan Note (Signed)
InferoTemporal tractional detachment, recurrent yet the macula of the foveal region are attached.  Optic nerve no signs of atrophy.  OD, will plan for vitrectomy, silicone oil removal, epiretinal tissue removal with tissue blue staining, resection of inferotemporal retinal detachment likely gas injection after laser retinopexy.

## 2019-09-29 NOTE — Progress Notes (Signed)
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  Discomfort includes none.  Vision is stable.  I, the attending physician,  performed the HPI with the patient and updated documentation appropriately.          Comments    8 Week s\p OD. FP  Pt states OD vision is about the same. Denies any pain.         Last edited by Tilda Franco on 09/29/2019  3:52 PM. (History)      Referring physician: [name], [name], DO [address] [place],   [id number]  HISTORICAL INFORMATION:   Selected notes from the MEDICAL RECORD NUMBER    Lab Results  Component Value Date   HGBA1C  12/09/2006    5.6 (NOTE)   The ADA recommends the following therapeutic goals for glycemic   control related to Hgb A1C measurement:   Goal of Therapy:   < 7.0% Hgb A1C   Action Suggested:  > 8.0% Hgb A1C   Ref:  Diabetes Care, 22, Suppl. 1, 1999     CURRENT MEDICATIONS: Current Outpatient Medications (Ophthalmic Drugs)  Medication Sig  . loteprednol (LOTEMAX) 0.5 % ophthalmic suspension Place 1 drop into the right eye in the morning and at bedtime.  Marland Kitchen ofloxacin (OCUFLOX) 0.3 % ophthalmic solution Place 1 drop into the right eye 4 (four) times daily.  . prednisoLONE acetate (PRED FORTE) 1 % ophthalmic suspension SHAKE LIQUID AND INSTILL 1 DROP IN RIGHT EYE FOUR TIMES DAILY   No current facility-administered medications for this visit. (Ophthalmic Drugs)   Current Outpatient Medications (Other)  Medication Sig  . albuterol (VENTOLIN HFA) 108 (90 Base) MCG/ACT inhaler Inhale 2 puffs into the lungs every 6 (six) hours as needed for wheezing or shortness of breath.  . ALBUTEROL IN Inhale into the lungs as needed.  . Albuterol Sulfate 2.5 MG/0.5ML NEBU Inhale into the lungs. 1 nebule every 6 hours as needed   . atorvastatin (LIPITOR) 40 MG tablet TAKE 1 TABLET EVERY MORNING  . BREO ELLIPTA 100-25 MCG/INH AEPB USE 1 INHALATION ORALLY    DAILY  . Cholecalciferol (VITAMIN D) 50 MCG (2000 UT) tablet Take 2,000 Units by mouth daily.  . Coenzyme Q10 (CO Q 10 PO) Take 1 tablet by mouth daily.  . Multiple Vitamin (MULTIVITAMIN) tablet Take 1 tablet by mouth every morning.   . nebivolol (BYSTOLIC) 10 MG tablet Take 1 tablet (10 mg total) by mouth daily.  Marland Kitchen olmesartan-hydrochlorothiazide (BENICAR HCT) 40-12.5 MG tablet TAKE 1 TABLET DAILY  . omeprazole (PRILOSEC) 40 MG capsule Take 1 capsule (40 mg total) by mouth daily.  . pantoprazole (PROTONIX) 40 MG tablet TAKE 1 TABLET BY MOUTH ONCE DAILY (Patient not taking: Reported on 04/08/2019)  . silodosin (RAPAFLO) 8 MG CAPS capsule Take 8 mg by mouth at bedtime.  . solifenacin (VESICARE) 5 MG tablet Take 1 tablet (5 mg total) by mouth daily.   No current facility-administered medications for this visit. (Other)      REVIEW OF SYSTEMS:    ALLERGIES Allergies  Allergen Reactions  . Merthiolate [Thimerosal] Rash    Rash--local    PAST MEDICAL HISTORY Past Medical History:  Diagnosis Date  . BPH (benign prostatic hypertrophy)   .  GERD (gastroesophageal reflux disease)   . H/O hiatal hernia   . History of colon polyps   . Hyperlipidemia   . Hypertension   . Knee internal derangement    RIGHT  . Mild asthma   . Vitamin D deficiency   . Wears glasses    Past Surgical History:  Procedure Laterality Date  . COLONOSCOPY W/ POLYPECTOMY  01-21-2008  . ESOPHAGOGASTRODUODENOSCOPY  11-15-2008  . KNEE ARTHROSCOPY Right X4  last one 2005  . KNEE ARTHROSCOPY Right 12/06/2013   Procedure: ARTHROSCOPY RIGHT KNEE WITH DEBRIDMENT AND PARTIAL MEDIAL AND LATERAL MENISCECTOMY AND CHONDRLPLASTY;  Surgeon: [name], MD;  Location: [facility];  Service: Orthopedics;  Laterality: Right;  . LUMBAR DISC SURGERY  04-02-2007   L5 -- S1  .  NEGATIVE SLEEP STUDY  per pt  . PARATHYROIDECTOMY  1989   removal adenoma    FAMILY HISTORY Family History  Problem Relation Age of Onset  . Hyperlipidemia Mother   . Coronary artery disease Mother   . Pulmonary embolism Father   . Colon cancer Father   . Hyperlipidemia Brother   . Pancreatic cancer Brother     SOCIAL HISTORY Social History   Tobacco Use  . Smoking status: Never Smoker  . Smokeless tobacco: Never Used  Substance Use Topics  . Alcohol use: Yes    Alcohol/week: 0.0 standard drinks    Comment: social use  . Drug use: No         OPHTHALMIC EXAM: Base Eye Exam    Visual Acuity (Snellen - Linear)      Right Left   Dist [place] 20/400 20/20   Dist ph [place] 20/100 +        Tonometry (Tonopen, 3:55 PM)      Right Left   Pressure 8 11       Pupils      Pupils Dark Light Shape React APD   Right PERRL 3 3 Round Minimal None   Left PERRL 4 3 Round Brisk None       Neuro/Psych    Oriented x3: Yes   Mood/Affect: Normal       Dilation    Right eye: 1.0% Mydriacyl, 2.5% Phenylephrine @ 3:55 PM        Slit Lamp and Fundus Exam    External Exam      Right Left   External Normal Normal       Slit Lamp Exam      Right Left   Lids/Lashes Normal Normal   Conjunctiva/Sclera White and quiet White and quiet   Cornea Clear Clear   Anterior Chamber Deep and quiet Deep and quiet   Iris Round and reactive Round and reactive   Lens Posterior chamber intraocular lens    Anterior Vitreous Normal Normal       Fundus Exam      Right Left   Posterior Vitreous Clear, silicone oil    Disc Normal    C/D Ratio 0.25    Macula Normal,,  no topo distortion    Vessels Normal    Periphery Attached,,good pexy inferiorly, good buckle, tire is temporal, band 360,, good pexy ,, new subretinal fluid, local loculated anterior to the equator between the 5 and 6:00 meridian.  Clear retinal hole but well surrounded and laser demarcated chorioretinal scarring 360            IMAGING AND PROCEDURES  Imaging and Procedures for 09/29/19  Color Fundus Photography Optos -  OU - Both Eyes       Right Eye Progression has improved. Disc findings include normal observations.   Notes Severe epiretinal membrane persist inferotemporal portion of the macula with secondary tortuosity into the fovea, retina detached in the posterior pole.  Inferonasal shallow traction retinal detachment atrophic changes peripherally.  This has not changed in size or progression.                ASSESSMENT/PLAN:  Traction detachment of right retina InferoTemporal tractional detachment, recurrent yet the macula of the foveal region are attached.  Optic nerve no signs of atrophy.  OD, will plan for vitrectomy, silicone oil removal, epiretinal tissue removal with tissue blue staining, resection of inferotemporal retinal detachment likely gas injection after laser retinopexy.      ICD-10-CM   1. Macular pucker, right eye  H35.371 Color Fundus Photography Optos - OU - Both Eyes  2. Traction detachment of right retina  H33.41     1.  OD, retained silicone oil, macula detached.  Some tractional changes remain with folds accounting for distorted vision.  Careful examination does disclose it epiretinal tissue with a very small amount of internal limiting membrane inferotemporal to the fovea which will need resection at the time of silicone oil evacuation.  Moreover inferotemporal atrophic traction retinal detachment will also need resection Endo cautery, laser retinopexy and flattening followed by intraocular gas injection.  2.  Is asked to continue on p.o. vitamin B complex  3.  Patient is asked to continue on natural supplement turmeric  Ophthalmic Meds Ordered this visit:  No orders of the defined types were placed in this encounter.      No follow-ups on file.  There are no Patient Instructions on file for this visit.   Explained the diagnoses, plan, and follow up  with the patient and they expressed understanding.  Patient expressed understanding of the importance of proper follow up care.   Clent Demark Meerab Maselli M.D. Diseases & Surgery of the Retina and Vitreous Retina & Diabetic [place] 09/29/19     Abbreviations: M myopia (nearsighted); A astigmatism; H hyperopia (farsighted); P presbyopia; Mrx spectacle prescription;  CTL contact lenses; OD right eye; OS left eye; OU both eyes  XT exotropia; ET esotropia; PEK punctate epithelial keratitis; PEE punctate epithelial erosions; DES dry eye syndrome; MGD meibomian gland dysfunction; ATs artificial tears; PFAT's preservative free artificial tears; [place] nuclear sclerotic cataract; PSC posterior subcapsular cataract; ERM epi-retinal membrane; PVD posterior vitreous detachment; RD retinal detachment; DM diabetes mellitus; DR diabetic retinopathy; NPDR non-proliferative diabetic retinopathy; PDR proliferative diabetic retinopathy; CSME clinically significant macular edema; DME diabetic macular edema; dbh dot blot hemorrhages; CWS cotton wool spot; POAG primary open angle glaucoma; C/D cup-to-disc ratio; HVF humphrey visual field; GVF goldmann visual field; OCT optical coherence tomography; IOP intraocular pressure; BRVO Branch retinal vein occlusion; CRVO central retinal vein occlusion; CRAO central retinal artery occlusion; BRAO branch retinal artery occlusion; RT retinal tear; SB scleral buckle; PPV pars plana vitrectomy; VH Vitreous hemorrhage; PRP panretinal laser photocoagulation; IVK intravitreal kenalog; VMT vitreomacular traction; MH Macular hole;  NVD neovascularization of the disc; NVE neovascularization elsewhere; AREDS age related eye disease study; ARMD age related macular degeneration; POAG primary open angle glaucoma; EBMD epithelial/anterior basement membrane dystrophy; ACIOL anterior chamber intraocular lens; IOL intraocular lens; PCIOL posterior chamber intraocular lens; Phaco/IOL phacoemulsification with  intraocular lens placement; [place] photorefractive keratectomy; LASIK laser assisted in situ keratomileusis; HTN hypertension; DM diabetes mellitus; COPD chronic obstructive pulmonary disease

## 2019-11-03 ENCOUNTER — Other Ambulatory Visit: Payer: Self-pay

## 2019-11-03 ENCOUNTER — Encounter (INDEPENDENT_AMBULATORY_CARE_PROVIDER_SITE_OTHER): Payer: Self-pay | Admitting: Ophthalmology

## 2019-11-03 ENCOUNTER — Ambulatory Visit (INDEPENDENT_AMBULATORY_CARE_PROVIDER_SITE_OTHER): Payer: Federal, State, Local not specified - PPO | Admitting: Ophthalmology

## 2019-11-03 DIAGNOSIS — H35371 Puckering of macula, right eye: Secondary | ICD-10-CM | POA: Diagnosis not present

## 2019-11-03 DIAGNOSIS — H3341 Traction detachment of retina, right eye: Secondary | ICD-10-CM

## 2019-11-03 MED ORDER — OFLOXACIN 0.3 % OP SOLN: 1.0000 [drp] | Freq: Four times a day (QID) | OPHTHALMIC | 0 refills | Status: AC

## 2019-11-03 MED ORDER — PREDNISOLONE ACETATE 1 % OP SUSP
1.0000 [drp] | Freq: Four times a day (QID) | OPHTHALMIC | 0 refills | Status: DC
Start: 2019-11-03 — End: 2020-08-14

## 2019-11-03 NOTE — Progress Notes (Signed)
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  Vision is stable.          Comments    1 Month Post Op OD  Pt denies noticeable changes to [place] OU since last visit. Pt denies ocular pain, flashes of light, or floaters OU.  Stable VA reported OU.        Last edited by [name], [place] on 11/03/2019  3:33 PM. (History)      Referring physician: Carollee [name], Jason Apa, DO [address] [place],  [place] [address]  HISTORICAL INFORMATION:   Selected notes from the MEDICAL RECORD NUMBER    Lab Results  Component Value Date   HGBA1C  12/09/2006    5.6 (NOTE)   The ADA recommends the following therapeutic goals for glycemic   control related to Hgb A1C measurement:   Goal of Therapy:   < 7.0% Hgb A1C   Action Suggested:  > 8.0% Hgb A1C   Ref:  Diabetes Care, 22, Suppl. 1, 1999     CURRENT MEDICATIONS: Current Outpatient Medications (Ophthalmic Drugs)  Medication Sig  . loteprednol (LOTEMAX) 0.5 % ophthalmic suspension Place 1 drop into the right eye in the morning and at bedtime.  Marland Kitchen ofloxacin (OCUFLOX) 0.3 % ophthalmic solution Place 1 drop into the right eye 4 (four) times daily. (Patient not taking: Reported on 11/03/2019)  . ofloxacin (OCUFLOX) 0.3 % ophthalmic solution Place 1 drop into the right eye 4 (four) times daily for 10 days.  . prednisoLONE acetate (PRED FORTE) 1 % ophthalmic suspension SHAKE LIQUID AND INSTILL 1 DROP IN RIGHT EYE FOUR TIMES DAILY (Patient not taking: Reported on 11/03/2019)  . prednisoLONE acetate (PRED FORTE) 1 % ophthalmic suspension Place 1 drop into the right eye 4 (four) times daily.   No current facility-administered medications for this visit. (Ophthalmic Drugs)   Current Outpatient Medications (Other)  Medication Sig  . albuterol (VENTOLIN HFA) 108  (90 Base) MCG/ACT inhaler Inhale 2 puffs into the lungs every 6 (six) hours as needed for wheezing or shortness of breath.  . ALBUTEROL IN Inhale into the lungs as needed.  . Albuterol Sulfate 2.5 MG/0.5ML NEBU Inhale into the lungs. 1 nebule every 6 hours as needed  . atorvastatin (LIPITOR) 40 MG tablet TAKE 1 TABLET EVERY MORNING  . BREO ELLIPTA 100-25 MCG/INH AEPB USE 1 INHALATION ORALLY    DAILY  . Cholecalciferol (VITAMIN D) 50 MCG (2000 UT) tablet Take 2,000 Units by mouth daily.  . Coenzyme Q10 (CO Q 10 PO) Take 1 tablet by mouth daily.  . Multiple Vitamin (MULTIVITAMIN) tablet Take 1 tablet by mouth every morning.   . nebivolol (BYSTOLIC) 10 MG tablet Take 1 tablet (10 mg total) by mouth daily.  Marland Kitchen olmesartan-hydrochlorothiazide (BENICAR HCT) 40-12.5 MG tablet TAKE 1 TABLET DAILY  . omeprazole (PRILOSEC) 40 MG capsule Take 1 capsule (40 mg total) by mouth daily.  . pantoprazole (PROTONIX) 40 MG tablet TAKE 1 TABLET BY MOUTH ONCE DAILY (Patient not taking: Reported on 04/08/2019)  . silodosin (RAPAFLO) 8 MG CAPS capsule Take 8 mg by mouth at bedtime.  . solifenacin (VESICARE) 5 MG tablet Take 1 tablet (5 mg total) by mouth daily.   No current facility-administered medications  for this visit. (Other)      REVIEW OF SYSTEMS:    ALLERGIES Allergies  Allergen Reactions  . Merthiolate [Thimerosal] Rash    Rash--local    PAST MEDICAL HISTORY Past Medical History:  Diagnosis Date  . BPH (benign prostatic hypertrophy)   . GERD (gastroesophageal reflux disease)   . H/O hiatal hernia   . History of colon polyps   . Hyperlipidemia   . Hypertension   . Knee internal derangement    RIGHT  . Mild asthma   . Vitamin D deficiency   . Wears glasses    Past Surgical History:  Procedure Laterality Date  . COLONOSCOPY W/ POLYPECTOMY  01-21-2008  . ESOPHAGOGASTRODUODENOSCOPY  11-15-2008  . KNEE ARTHROSCOPY Right X4  last one 2005  . KNEE ARTHROSCOPY Right 12/06/2013   Procedure:  ARTHROSCOPY RIGHT KNEE WITH DEBRIDMENT AND PARTIAL MEDIAL AND LATERAL MENISCECTOMY AND CHONDRLPLASTY;  Surgeon: Jason Cabal, MD;  Location: [facility];  Service: Orthopedics;  Laterality: Right;  . LUMBAR DISC SURGERY  04-02-2007   L5 -- S1  . NEGATIVE SLEEP STUDY  per pt  . PARATHYROIDECTOMY  1989   removal adenoma    FAMILY HISTORY Family History  Problem Relation Age of Onset  . Hyperlipidemia Mother   . Coronary artery disease Mother   . Pulmonary embolism Father   . Colon cancer Father   . Hyperlipidemia Brother   . Pancreatic cancer Brother     SOCIAL HISTORY Social History   Tobacco Use  . Smoking status: Never Smoker  . Smokeless tobacco: Never Used  Substance Use Topics  . Alcohol use: Yes    Alcohol/week: 0.0 standard drinks    Comment: social use  . Drug use: No         OPHTHALMIC EXAM: Base Eye Exam    Visual Acuity (ETDRS)      Right Left   Dist [name] 20/400    Dist cc  20/20   Dist ph [name] 20/100 -2    Correction: Glasses       Tonometry (Tonopen, 3:34 PM)      Right Left   Pressure 11 12       Pupils      Pupils Dark Light Shape React APD   Right PERRL 4 3 Round Brisk None   Left PERRL 4 3 Round Brisk None       Visual Fields (Counting fingers)      Left Right    Full    Restrictions  Partial outer superior nasal deficiency       Extraocular Movement      Right Left    Full Full       Neuro/Psych    Oriented x3: Yes   Mood/Affect: Normal       Dilation    Right eye: 1.0% Mydriacyl, 2.5% Phenylephrine @ 3:38 PM        Slit Lamp and Fundus Exam    External Exam      Right Left   External Normal Normal       Slit Lamp Exam      Right Left   Lids/Lashes Normal Normal   Conjunctiva/Sclera White and quiet White and quiet   Cornea Clear Clear   Anterior Chamber Deep and quiet Deep and quiet   Iris Round and reactive Round and reactive   Lens Posterior chamber intraocular lens    Anterior Vitreous Normal  Normal       Fundus Exam  Right Left   Posterior Vitreous Clear, silicone oil    Disc Normal    C/D Ratio 0.25    Macula severe topo distortion    Vessels Normal    Periphery Same inferonasal detachment, adjacent to prior chorioretinal scar, retinotomy site for subretinal drainage atrophic hole seen at the equator at the 4 o'clock position proliferations in this region of localized recurrent detachment. No connection to the [facility] pexy inferiorly, good buckle, tire is temporal, band 360,, good pexy ,, new subretinal fluid, local loculated anterior to the equator between the 5 and 6:00 meridian.  Clear retinal hole but well surrounded and laser demarcated chorioretinal scarring 360           IMAGING AND PROCEDURES  Imaging and Procedures for 11/03/19  Color Fundus Photography Optos - OU - Both Eyes       Right Eye Progression has been stable. Disc findings include normal observations. Macula : epiretinal membrane. Vessels : normal observations.   Notes Clear silicone oil, moderate to severe topographic distortion of the macular region due to epiretinal membrane/internal limiting membrane striae.  Inferonasal noncontiguous rhegmatogenous retinal detachment with atrophic hole.  OS normal.                ASSESSMENT/PLAN:  Macular pucker, right eye No recurrence of epiretinal tissue tortuous intraretinal findings along the foveal region inferotemporal. Time of silicone oil removal will need internal limiting membrane peel using like material.  Traction detachment of right retina Improving centrally in the macular region, small recurrence inferonasal with no subretinal fluid connection to the macula. This component of localized retinal detachment with an atrophic hole will be addressed at the time of surgical repair via removal of silicone oil, removal of epiretinal membrane with tissue blue staining to remove the ILM as well as repair of localized retinal  detachment inferonasal.      ICD-10-CM   1. Macular pucker, right eye  H35.371 Color Fundus Photography Optos - OU - Both Eyes  2. Traction detachment of right retina  H33.41     1.  2.  3.  Ophthalmic Meds Ordered this visit:  Meds ordered this encounter  Medications  . prednisoLONE acetate (PRED FORTE) 1 % ophthalmic suspension    Sig: Place 1 drop into the right eye 4 (four) times daily.    Dispense:  10 mL    Refill:  0  . ofloxacin (OCUFLOX) 0.3 % ophthalmic solution    Sig: Place 1 drop into the right eye 4 (four) times daily for 10 days.    Dispense:  5 mL    Refill:  0       Return ,,, With repair of rhegmatogenous retinal detachment, injection gas, with general, for Schedule vitrectomy, silicone oil removal, internal limiting membrane peel OD.  There are no Patient Instructions on file for this visit.   Explained the diagnoses, plan, and follow up with the patient and they expressed understanding.  Patient expressed understanding of the importance of proper follow up care.   Clent Demark Luisenrique Conran M.D. Diseases & Surgery of the Retina and Vitreous [facility] 11/03/19     Abbreviations: M myopia (nearsighted); A astigmatism; H hyperopia (farsighted); P presbyopia; Mrx spectacle prescription;  CTL contact lenses; OD right eye; OS left eye; OU both eyes  XT exotropia; ET esotropia; PEK punctate epithelial keratitis; PEE punctate epithelial erosions; DES dry eye syndrome; MGD meibomian gland dysfunction; ATs artificial tears; PFAT's preservative free artificial tears; [place] nuclear  sclerotic cataract; PSC posterior subcapsular cataract; ERM epi-retinal membrane; PVD posterior vitreous detachment; RD retinal detachment; DM diabetes mellitus; DR diabetic retinopathy; NPDR non-proliferative diabetic retinopathy; PDR proliferative diabetic retinopathy; CSME clinically significant macular edema; DME diabetic macular edema; dbh dot blot hemorrhages; CWS cotton wool  spot; POAG primary open angle glaucoma; C/D cup-to-disc ratio; HVF humphrey visual field; GVF goldmann visual field; OCT optical coherence tomography; IOP intraocular pressure; BRVO Branch retinal vein occlusion; CRVO central retinal vein occlusion; CRAO central retinal artery occlusion; BRAO branch retinal artery occlusion; RT retinal tear; SB scleral buckle; PPV pars plana vitrectomy; VH Vitreous hemorrhage; PRP panretinal laser photocoagulation; IVK intravitreal kenalog; VMT vitreomacular traction; MH Macular hole;  NVD neovascularization of the disc; NVE neovascularization elsewhere; AREDS age related eye disease study; ARMD age related macular degeneration; POAG primary open angle glaucoma; EBMD epithelial/anterior basement membrane dystrophy; ACIOL anterior chamber intraocular lens; IOL intraocular lens; PCIOL posterior chamber intraocular lens; Phaco/IOL phacoemulsification with intraocular lens placement; [place] photorefractive keratectomy; LASIK laser assisted in situ keratomileusis; HTN hypertension; DM diabetes mellitus; COPD chronic obstructive pulmonary disease

## 2019-11-03 NOTE — Assessment & Plan Note (Signed)
Improving centrally in the macular region, small recurrence inferonasal with no subretinal fluid connection to the macula. This component of localized retinal detachment with an atrophic hole will be addressed at the time of surgical repair via removal of silicone oil, removal of epiretinal membrane with tissue blue staining to remove the ILM as well as repair of localized retinal detachment inferonasal.

## 2019-11-03 NOTE — Assessment & Plan Note (Signed)
No recurrence of epiretinal tissue tortuous intraretinal findings along the foveal region inferotemporal. Time of silicone oil removal will need internal limiting membrane peel using like material.

## 2019-11-10 ENCOUNTER — Encounter (INDEPENDENT_AMBULATORY_CARE_PROVIDER_SITE_OTHER): Payer: Federal, State, Local not specified - PPO | Admitting: Ophthalmology

## 2019-11-30 ENCOUNTER — Encounter (INDEPENDENT_AMBULATORY_CARE_PROVIDER_SITE_OTHER): Payer: Federal, State, Local not specified - PPO | Admitting: Ophthalmology

## 2019-11-30 DIAGNOSIS — H3341 Traction detachment of retina, right eye: Secondary | ICD-10-CM | POA: Diagnosis not present

## 2019-11-30 DIAGNOSIS — Z8669 Personal history of other diseases of the nervous system and sense organs: Secondary | ICD-10-CM | POA: Diagnosis not present

## 2019-11-30 DIAGNOSIS — H35371 Puckering of macula, right eye: Secondary | ICD-10-CM | POA: Diagnosis not present

## 2019-11-30 DIAGNOSIS — Z48810 Encounter for surgical aftercare following surgery on the sense organs: Secondary | ICD-10-CM | POA: Diagnosis not present

## 2019-11-30 DIAGNOSIS — H33021 Retinal detachment with multiple breaks, right eye: Secondary | ICD-10-CM | POA: Diagnosis not present

## 2019-12-01 ENCOUNTER — Ambulatory Visit (INDEPENDENT_AMBULATORY_CARE_PROVIDER_SITE_OTHER): Payer: Federal, State, Local not specified - PPO | Admitting: Ophthalmology

## 2019-12-01 ENCOUNTER — Other Ambulatory Visit: Payer: Self-pay

## 2019-12-01 ENCOUNTER — Encounter (INDEPENDENT_AMBULATORY_CARE_PROVIDER_SITE_OTHER): Payer: Self-pay | Admitting: Ophthalmology

## 2019-12-01 DIAGNOSIS — H35371 Puckering of macula, right eye: Secondary | ICD-10-CM

## 2019-12-01 DIAGNOSIS — H3341 Traction detachment of retina, right eye: Secondary | ICD-10-CM

## 2019-12-01 DIAGNOSIS — Z09 Encounter for follow-up examination after completed treatment for conditions other than malignant neoplasm: Secondary | ICD-10-CM | POA: Insufficient documentation

## 2019-12-01 NOTE — Assessment & Plan Note (Signed)
Status post removal of the silicone oil, repair retinal detachment, 11-30-19

## 2019-12-01 NOTE — Assessment & Plan Note (Signed)
Commence with topical therapy  Prednisolone acetate 1 drop right eye 4 times daily  Ofloxacin 1 drop right eye 4 times daily

## 2019-12-01 NOTE — Progress Notes (Signed)
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  Vision is stable.          Comments    1 Day POV OD, post vitrectomy, removal silicone oil, membrane peel, resection of inferonasal traction retinal detachment, injection C3F8 8% gas  Pt reports difficulty sleeping on one side. No nausea or vomiting. No ocular pain.       Last edited by Hurman Horn, MD on 12/01/2019  9:33 AM. (History)      Referring physician: [name], [name], DO [address] [place],  [place] [address]  HISTORICAL INFORMATION:   Selected notes from the MEDICAL RECORD NUMBER    Lab Results  Component Value Date   HGBA1C  12/09/2006    5.6 (NOTE)   The ADA recommends the following therapeutic goals for glycemic   control related to Hgb A1C measurement:   Goal of Therapy:   < 7.0% Hgb A1C   Action Suggested:  > 8.0% Hgb A1C   Ref:  Diabetes Care, 22, Suppl. 1, 1999     CURRENT MEDICATIONS: Current Outpatient Medications (Ophthalmic Drugs)  Medication Sig  . loteprednol (LOTEMAX) 0.5 % ophthalmic suspension Place 1 drop into the right eye in the morning and at bedtime.  Marland Kitchen ofloxacin (OCUFLOX) 0.3 % ophthalmic solution Place 1 drop into the right eye 4 (four) times daily. (Patient not taking: Reported on 11/03/2019)  . prednisoLONE acetate (PRED FORTE) 1 % ophthalmic suspension SHAKE LIQUID AND INSTILL 1 DROP IN RIGHT EYE FOUR TIMES DAILY (Patient not taking: Reported on 11/03/2019)  . prednisoLONE acetate (PRED FORTE) 1 % ophthalmic suspension Place 1 drop into the right eye 4 (four) times daily.   No current facility-administered medications for this visit. (Ophthalmic Drugs)   Current Outpatient Medications (Other)  Medication Sig  . albuterol (VENTOLIN HFA) 108 (90 Base) MCG/ACT inhaler Inhale 2 puffs into  the lungs every 6 (six) hours as needed for wheezing or shortness of breath.  . ALBUTEROL IN Inhale into the lungs as needed.  . Albuterol Sulfate 2.5 MG/0.5ML NEBU Inhale into the lungs. 1 nebule every 6 hours as needed  . atorvastatin (LIPITOR) 40 MG tablet TAKE 1 TABLET EVERY MORNING  . BREO ELLIPTA 100-25 MCG/INH AEPB USE 1 INHALATION ORALLY    DAILY  . Cholecalciferol (VITAMIN D) 50 MCG (2000 UT) tablet Take 2,000 Units by mouth daily.  . Coenzyme Q10 (CO Q 10 PO) Take 1 tablet by mouth daily.  . Multiple Vitamin (MULTIVITAMIN) tablet Take 1 tablet by mouth every morning.   . nebivolol (BYSTOLIC) 10 MG tablet Take 1 tablet (10 mg total) by mouth daily.  Marland Kitchen olmesartan-hydrochlorothiazide (BENICAR HCT) 40-12.5 MG tablet TAKE 1 TABLET DAILY  . omeprazole (PRILOSEC) 40 MG capsule Take 1 capsule (40 mg total) by mouth daily.  . pantoprazole (PROTONIX) 40 MG tablet TAKE 1 TABLET BY MOUTH ONCE DAILY (Patient not taking: Reported on 04/08/2019)  . silodosin (RAPAFLO) 8 MG CAPS capsule Take 8 mg by mouth at bedtime.  . solifenacin (VESICARE) 5 MG tablet Take 1 tablet (5 mg total) by mouth daily.   No current facility-administered medications for this visit. (Other)      REVIEW OF SYSTEMS:    ALLERGIES Allergies  Allergen Reactions  . Merthiolate [Thimerosal] Rash    Rash--local    PAST MEDICAL HISTORY Past Medical History:  Diagnosis Date  . BPH (benign prostatic hypertrophy)   . GERD (gastroesophageal reflux disease)   . H/O hiatal hernia   . History of colon polyps   . Hyperlipidemia   . Hypertension   . Knee internal derangement    RIGHT  . Mild asthma   . Vitamin D deficiency   . Wears glasses    Past Surgical History:  Procedure Laterality Date  . COLONOSCOPY W/ POLYPECTOMY  01-21-2008  . ESOPHAGOGASTRODUODENOSCOPY  11-15-2008  . KNEE ARTHROSCOPY Right X4  last one 2005  . KNEE ARTHROSCOPY Right 12/06/2013   Procedure: ARTHROSCOPY RIGHT KNEE WITH DEBRIDMENT AND  PARTIAL MEDIAL AND LATERAL MENISCECTOMY AND CHONDRLPLASTY;  Surgeon: [name], MD;  Location: [facility];  Service: Orthopedics;  Laterality: Right;  . LUMBAR DISC SURGERY  04-02-2007   L5 -- S1  . NEGATIVE SLEEP STUDY  per pt  . PARATHYROIDECTOMY  1989   removal adenoma    FAMILY HISTORY Family History  Problem Relation Age of Onset  . Hyperlipidemia Mother   . Coronary artery disease Mother   . Pulmonary embolism Father   . Colon cancer Father   . Hyperlipidemia Brother   . Pancreatic cancer Brother     SOCIAL HISTORY Social History   Tobacco Use  . Smoking status: Never Smoker  . Smokeless tobacco: Never Used  Substance Use Topics  . Alcohol use: Yes    Alcohol/week: 0.0 standard drinks    Comment: social use  . Drug use: No         OPHTHALMIC EXAM:  Base Eye Exam    Visual Acuity (ETDRS)      Right Left   Dist [place] HM    Dist cc  20/20   Dist ph [place] NI    Correction: Glasses       Tonometry (Tonopen, 8:54 AM)      Right Left   Pressure 12 17       Pupils      Dark Light Shape React APD   Right 5 5 Round Dilated None   Left 4 3 Round Brisk None       Visual Fields (Counting fingers)      Left Right    Full    Restrictions  Total superior temporal, inferior temporal, superior nasal deficiencies       Extraocular Movement      Right Left    Full Full       Neuro/Psych    Oriented x3: Yes   Mood/Affect: Normal       Dilation    Right eye: 1.0% Mydriacyl, 2.5% Phenylephrine @ 9:00 AM        Slit Lamp and Fundus Exam    External Exam      Right Left   External Normal Normal       Slit Lamp Exam      Right Left   Lids/Lashes Normal Normal   Conjunctiva/Sclera White and quiet White and quiet   Cornea Clear Clear   Anterior Chamber Deep and quiet Deep and quiet   Iris Round and reactive Round and reactive   Lens Posterior chamber intraocular lens    Anterior Vitreous Normal Normal       Fundus Exam       Right Left   Posterior Vitreous Clear, vitrectomized, gas 90%  Disc Normal    C/D Ratio 0.25    Macula No topographic distortion, attached    Vessels Normal    Periphery Former inferonasal retinal detachment, well demarcated and treated in the bed of detachment post retinotomy, retinectomy inferonasal.  Attached           IMAGING AND PROCEDURES  Imaging and Procedures for 12/01/19           ASSESSMENT/PLAN:  Macular pucker, right eye Recurrent, from PVR, postop day #1 resection complete  Traction detachment of right retina Status post removal of the silicone oil, repair retinal detachment, 11-30-19  Postoperative follow-up Commence with topical therapy  Prednisolone acetate 1 drop right eye 4 times daily  Ofloxacin 1 drop right eye 4 times daily      ICD-10-CM   1. Macular pucker, right eye  H35.371   2. Traction detachment of right retina  H33.41   3. Postoperative follow-up  Z09     1.  Restrictions reviewed with the patient.  2.  Patient instructed not to mash, press, rub the eye  3.  Ophthalmic Meds Ordered this visit:  No orders of the defined types were placed in this encounter.      Return in about 1 week (around 12/08/2019) for dilate, OD, COLOR FP.  There are no Patient Instructions on file for this visit.   Explained the diagnoses, plan, and follow up with the patient and they expressed understanding.  Patient expressed understanding of the importance of proper follow up care.   Clent Demark Jordi Kamm M.D. Diseases & Surgery of the Retina and Vitreous Retina & Diabetic [facility] 12/01/19     Abbreviations: M myopia (nearsighted); A astigmatism; H hyperopia (farsighted); P presbyopia; Mrx spectacle prescription;  CTL contact lenses; OD right eye; OS left eye; OU both eyes  XT exotropia; ET esotropia; PEK punctate epithelial keratitis; PEE punctate epithelial erosions; DES dry eye syndrome; MGD meibomian gland dysfunction; ATs artificial tears;  PFAT's preservative free artificial tears; [place] nuclear sclerotic cataract; PSC posterior subcapsular cataract; ERM epi-retinal membrane; PVD posterior vitreous detachment; RD retinal detachment; DM diabetes mellitus; DR diabetic retinopathy; NPDR non-proliferative diabetic retinopathy; PDR proliferative diabetic retinopathy; CSME clinically significant macular edema; DME diabetic macular edema; dbh dot blot hemorrhages; CWS cotton wool spot; POAG primary open angle glaucoma; C/D cup-to-disc ratio; HVF humphrey visual field; GVF goldmann visual field; OCT optical coherence tomography; IOP intraocular pressure; BRVO Branch retinal vein occlusion; CRVO central retinal vein occlusion; CRAO central retinal artery occlusion; BRAO branch retinal artery occlusion; RT retinal tear; SB scleral buckle; PPV pars plana vitrectomy; VH Vitreous hemorrhage; PRP panretinal laser photocoagulation; IVK intravitreal kenalog; VMT vitreomacular traction; MH Macular hole;  NVD neovascularization of the disc; NVE neovascularization elsewhere; AREDS age related eye disease study; ARMD age related macular degeneration; POAG primary open angle glaucoma; EBMD epithelial/anterior basement membrane dystrophy; ACIOL anterior chamber intraocular lens; IOL intraocular lens; PCIOL posterior chamber intraocular lens; Phaco/IOL phacoemulsification with intraocular lens placement; [place] photorefractive keratectomy; LASIK laser assisted in situ keratomileusis; HTN hypertension; DM diabetes mellitus; COPD chronic obstructive pulmonary disease

## 2019-12-01 NOTE — Assessment & Plan Note (Signed)
Recurrent, from PVR, postop day #1 resection complete

## 2019-12-06 ENCOUNTER — Other Ambulatory Visit: Payer: Self-pay

## 2019-12-06 ENCOUNTER — Encounter (INDEPENDENT_AMBULATORY_CARE_PROVIDER_SITE_OTHER): Payer: Self-pay | Admitting: Ophthalmology

## 2019-12-06 ENCOUNTER — Ambulatory Visit (INDEPENDENT_AMBULATORY_CARE_PROVIDER_SITE_OTHER): Payer: Federal, State, Local not specified - PPO | Admitting: Ophthalmology

## 2019-12-06 DIAGNOSIS — H35371 Puckering of macula, right eye: Secondary | ICD-10-CM

## 2019-12-06 DIAGNOSIS — H3341 Traction detachment of retina, right eye: Secondary | ICD-10-CM

## 2019-12-06 NOTE — Progress Notes (Signed)
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  Discomfort includes itching.  Vision is stable.          Comments    1 Week Post Op OD  Pt sts he still cannot see due to the gas bubble OD. Pt c/o grainy sensation OD. Pt c/o intermittent itching OD. Pt reports pressure sensation off and on, but not today OD. VA OS stable.       Last edited by [name], [place] on 12/06/2019  3:48 PM. (History)      Referring physician: [name], [name], DO [address] [place],  [place] [address]  HISTORICAL INFORMATION:   Selected notes from the MEDICAL RECORD NUMBER    Lab Results  Component Value Date   HGBA1C  12/09/2006    5.6 (NOTE)   The ADA recommends the following therapeutic goals for glycemic   control related to Hgb A1C measurement:   Goal of Therapy:   < 7.0% Hgb A1C   Action Suggested:  > 8.0% Hgb A1C   Ref:  Diabetes Care, 22, Suppl. 1, 1999     CURRENT MEDICATIONS: Current Outpatient Medications (Ophthalmic Drugs)  Medication Sig  . ofloxacin (OCUFLOX) 0.3 % ophthalmic solution Place 1 drop into the right eye 4 (four) times daily.  . prednisoLONE acetate (PRED FORTE) 1 % ophthalmic suspension SHAKE LIQUID AND INSTILL 1 DROP IN RIGHT EYE FOUR TIMES DAILY  . loteprednol (LOTEMAX) 0.5 % ophthalmic suspension Place 1 drop into the right eye in the morning and at bedtime.  . prednisoLONE acetate (PRED FORTE) 1 % ophthalmic suspension Place 1 drop into the right eye 4 (four) times daily.   No current facility-administered medications for this visit. (Ophthalmic Drugs)   Current Outpatient Medications (Other)  Medication Sig  . albuterol (VENTOLIN HFA) 108 (90 Base) MCG/ACT inhaler Inhale 2 puffs into the lungs every 6 (six) hours as needed for wheezing or shortness of breath.  .  ALBUTEROL IN Inhale into the lungs as needed.  . Albuterol Sulfate 2.5 MG/0.5ML NEBU Inhale into the lungs. 1 nebule every 6 hours as needed  . atorvastatin (LIPITOR) 40 MG tablet TAKE 1 TABLET EVERY MORNING  . BREO ELLIPTA 100-25 MCG/INH AEPB USE 1 INHALATION ORALLY    DAILY  . Cholecalciferol (VITAMIN D) 50 MCG (2000 UT) tablet Take 2,000 Units by mouth daily.  . Coenzyme Q10 (CO Q 10 PO) Take 1 tablet by mouth daily.  . Multiple Vitamin (MULTIVITAMIN) tablet Take 1 tablet by mouth every morning.   . nebivolol (BYSTOLIC) 10 MG tablet Take 1 tablet (10 mg total) by mouth daily.  Marland Kitchen olmesartan-hydrochlorothiazide (BENICAR HCT) 40-12.5 MG tablet TAKE 1 TABLET DAILY  . omeprazole (PRILOSEC) 40 MG capsule Take 1 capsule (40 mg total) by mouth daily.  . pantoprazole (PROTONIX) 40 MG tablet TAKE 1 TABLET BY MOUTH ONCE DAILY (Patient not taking: Reported on 04/08/2019)  . silodosin (RAPAFLO) 8 MG CAPS capsule Take 8 mg by mouth at bedtime.  . solifenacin (VESICARE) 5 MG tablet Take 1 tablet (5 mg total) by mouth daily.   No current facility-administered medications for this visit. (Other)      REVIEW OF SYSTEMS:    ALLERGIES Allergies  Allergen Reactions  .  Merthiolate [Thimerosal] Rash    Rash--local    PAST MEDICAL HISTORY Past Medical History:  Diagnosis Date  . BPH (benign prostatic hypertrophy)   . GERD (gastroesophageal reflux disease)   . H/O hiatal hernia   . History of colon polyps   . Hyperlipidemia   . Hypertension   . Knee internal derangement    RIGHT  . Mild asthma   . Vitamin D deficiency   . Wears glasses    Past Surgical History:  Procedure Laterality Date  . COLONOSCOPY W/ POLYPECTOMY  01-21-2008  . ESOPHAGOGASTRODUODENOSCOPY  11-15-2008  . KNEE ARTHROSCOPY Right X4  last one 2005  . KNEE ARTHROSCOPY Right 12/06/2013   Procedure: ARTHROSCOPY RIGHT KNEE WITH DEBRIDMENT AND PARTIAL MEDIAL AND LATERAL MENISCECTOMY AND CHONDRLPLASTY;  Surgeon: [name],  MD;  Location: [facility];  Service: Orthopedics;  Laterality: Right;  . LUMBAR DISC SURGERY  04-02-2007   L5 -- S1  . NEGATIVE SLEEP STUDY  per pt  . PARATHYROIDECTOMY  1989   removal adenoma    FAMILY HISTORY Family History  Problem Relation Age of Onset  . Hyperlipidemia Mother   . Coronary artery disease Mother   . Pulmonary embolism Father   . Colon cancer Father   . Hyperlipidemia Brother   . Pancreatic cancer Brother     SOCIAL HISTORY Social History   Tobacco Use  . Smoking status: Never Smoker  . Smokeless tobacco: Never Used  Substance Use Topics  . Alcohol use: Yes    Alcohol/week: 0.0 standard drinks    Comment: social use  . Drug use: No         OPHTHALMIC EXAM:  Base Eye Exam    Visual Acuity (ETDRS)      Right Left   Dist [place] CF @ 1'    Dist cc  20/20 -1   Dist ph [place] NI    Correction: Glasses       Tonometry (Tonopen, 3:49 PM)      Right Left   Pressure 14 19       Pupils      Dark Light Shape React APD   Right 5 4 Round Slow +1   Left 4 3 Round Slow None       Visual Fields (Counting fingers)      Left Right    Full    Restrictions  Total superior temporal, superior nasal deficiencies       Extraocular Movement      Right Left    Full Full       Neuro/Psych    Oriented x3: Yes   Mood/Affect: Normal       Dilation    Right eye: 1.0% Mydriacyl, 2.5% Phenylephrine @ 3:53 PM        Slit Lamp and Fundus Exam    External Exam      Right Left   External Normal Normal       Slit Lamp Exam      Right Left   Lids/Lashes Normal Normal   Conjunctiva/Sclera White and quiet White and quiet   Cornea Clear Clear   Anterior Chamber Deep and quiet Deep and quiet   Iris Round and reactive Round and reactive   Lens Posterior chamber intraocular lens    Anterior Vitreous Normal Normal       Fundus Exam      Right Left   Posterior Vitreous Clear, vitrectomized, gas 75%    Disc Normal  C/D Ratio 0.25     Macula No topographic distortion, attached, no vertical contraction remains of the arcades temporally    Vessels Normal    Periphery Former inferonasal retinal detachment, well demarcated and treated in the bed of detachment post retinotomy, retinectomy inferonasal.  Attached, with a great view in the recumbent supine position with 25 diopter lens           IMAGING AND PROCEDURES  Imaging and Procedures for 12/06/19  Color Fundus Photography Optos - OU - Both Eyes       Right Eye Progression has been stable. Disc findings include normal observations.   Notes Poor details centrally OD due to gas bubble.  Normal left eye                ASSESSMENT/PLAN:  Macular pucker, right eye Improved, status post vitrectomy, membrane peel, retina detached and macula with much less topographic distortion  Traction detachment of right retina Local inferonasal tractional detachment, resected, edges are flat and well sealed with laser retinopexy, no subretinal fluid      ICD-10-CM   1. Macular pucker, right eye  H35.371 Color Fundus Photography Optos - OU - Both Eyes  2. Traction detachment of right retina  H33.41     1.   2.  3.  Ophthalmic Meds Ordered this visit:  No orders of the defined types were placed in this encounter.      Return in about 2 weeks (around 12/20/2019) for dilate, OD, OCT.  There are no Patient Instructions on file for this visit.   Explained the diagnoses, plan, and follow up with the patient and they expressed understanding.  Patient expressed understanding of the importance of proper follow up care.   Clent Demark Aleeta Schmaltz M.D. Diseases & Surgery of the Retina and Vitreous Retina & Diabetic [place] 12/06/19     Abbreviations: M myopia (nearsighted); A astigmatism; H hyperopia (farsighted); P presbyopia; Mrx spectacle prescription;  CTL contact lenses; OD right eye; OS left eye; OU both eyes  XT exotropia; ET esotropia; PEK punctate  epithelial keratitis; PEE punctate epithelial erosions; DES dry eye syndrome; MGD meibomian gland dysfunction; ATs artificial tears; PFAT's preservative free artificial tears; [place] nuclear sclerotic cataract; PSC posterior subcapsular cataract; ERM epi-retinal membrane; PVD posterior vitreous detachment; RD retinal detachment; DM diabetes mellitus; DR diabetic retinopathy; NPDR non-proliferative diabetic retinopathy; PDR proliferative diabetic retinopathy; CSME clinically significant macular edema; DME diabetic macular edema; dbh dot blot hemorrhages; CWS cotton wool spot; POAG primary open angle glaucoma; C/D cup-to-disc ratio; HVF humphrey visual field; GVF goldmann visual field; OCT optical coherence tomography; IOP intraocular pressure; BRVO Branch retinal vein occlusion; CRVO central retinal vein occlusion; CRAO central retinal artery occlusion; BRAO branch retinal artery occlusion; RT retinal tear; SB scleral buckle; PPV pars plana vitrectomy; VH Vitreous hemorrhage; PRP panretinal laser photocoagulation; IVK intravitreal kenalog; VMT vitreomacular traction; MH Macular hole;  NVD neovascularization of the disc; NVE neovascularization elsewhere; AREDS age related eye disease study; ARMD age related macular degeneration; POAG primary open angle glaucoma; EBMD epithelial/anterior basement membrane dystrophy; ACIOL anterior chamber intraocular lens; IOL intraocular lens; PCIOL posterior chamber intraocular lens; Phaco/IOL phacoemulsification with intraocular lens placement; [place] photorefractive keratectomy; LASIK laser assisted in situ keratomileusis; HTN hypertension; DM diabetes mellitus; COPD chronic obstructive pulmonary disease

## 2019-12-06 NOTE — Assessment & Plan Note (Signed)
Improved, status post vitrectomy, membrane peel, retina detached and macula with much less topographic distortion

## 2019-12-06 NOTE — Assessment & Plan Note (Signed)
Local inferonasal tractional detachment, resected, edges are flat and well sealed with laser retinopexy, no subretinal fluid

## 2019-12-22 ENCOUNTER — Other Ambulatory Visit: Payer: Self-pay

## 2019-12-22 ENCOUNTER — Encounter (INDEPENDENT_AMBULATORY_CARE_PROVIDER_SITE_OTHER): Payer: Self-pay | Admitting: Ophthalmology

## 2019-12-22 ENCOUNTER — Ambulatory Visit (INDEPENDENT_AMBULATORY_CARE_PROVIDER_SITE_OTHER): Payer: Federal, State, Local not specified - PPO | Admitting: Ophthalmology

## 2019-12-22 DIAGNOSIS — H35351 Cystoid macular degeneration, right eye: Secondary | ICD-10-CM | POA: Insufficient documentation

## 2019-12-22 DIAGNOSIS — H35371 Puckering of macula, right eye: Secondary | ICD-10-CM | POA: Diagnosis not present

## 2019-12-22 NOTE — Assessment & Plan Note (Signed)
The minor intraretinal fluid and CME macula most likely post macular pucker changes yet to enhance healing will commence with topical nonsteroidal, and to maximize compliance, once daily use, will offer PROLENSA once daily, sample bottles given

## 2019-12-22 NOTE — Assessment & Plan Note (Signed)
OD, vastly improved status post surgical intervention

## 2019-12-22 NOTE — Progress Notes (Signed)
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  Vision is improved.          Comments    2 Week POV OD  Pt sts he is able to see over the bubble OD. Pt c/o some waviness OD. Pt denies ocular pain, but reports "heaviness" and "pressure" near the front of the eye OD.        Last edited by [name], [place] on 12/22/2019  3:34 PM. (History)      Referring physician: [name], [name], DO [address] [place],   [address]  HISTORICAL INFORMATION:   Selected notes from the MEDICAL RECORD NUMBER    Lab Results  Component Value Date   HGBA1C  12/09/2006    5.6 (NOTE)   The ADA recommends the following therapeutic goals for glycemic   control related to Hgb A1C measurement:   Goal of Therapy:   < 7.0% Hgb A1C   Action Suggested:  > 8.0% Hgb A1C   Ref:  Diabetes Care, 22, Suppl. 1, 1999     CURRENT MEDICATIONS: Current Outpatient Medications (Ophthalmic Drugs)  Medication Sig  . loteprednol (LOTEMAX) 0.5 % ophthalmic suspension Place 1 drop into the right eye in the morning and at bedtime.  Marland Kitchen ofloxacin (OCUFLOX) 0.3 % ophthalmic solution Place 1 drop into the right eye 4 (four) times daily.  . prednisoLONE acetate (PRED FORTE) 1 % ophthalmic suspension SHAKE LIQUID AND INSTILL 1 DROP IN RIGHT EYE FOUR TIMES DAILY  . prednisoLONE acetate (PRED FORTE) 1 % ophthalmic suspension Place 1 drop into the right eye 4 (four) times daily.   No current facility-administered medications for this visit. (Ophthalmic Drugs)   Current Outpatient Medications (Other)  Medication Sig  . albuterol (VENTOLIN HFA) 108 (90 Base) MCG/ACT inhaler Inhale 2 puffs into the lungs every 6 (six) hours as needed for wheezing or shortness of breath.  . ALBUTEROL IN Inhale into the lungs as needed.  .  Albuterol Sulfate 2.5 MG/0.5ML NEBU Inhale into the lungs. 1 nebule every 6 hours as needed  . atorvastatin (LIPITOR) 40 MG tablet TAKE 1 TABLET EVERY MORNING  . BREO ELLIPTA 100-25 MCG/INH AEPB USE 1 INHALATION ORALLY    DAILY  . Cholecalciferol (VITAMIN D) 50 MCG (2000 UT) tablet Take 2,000 Units by mouth daily.  . Coenzyme Q10 (CO Q 10 PO) Take 1 tablet by mouth daily.  . Multiple Vitamin (MULTIVITAMIN) tablet Take 1 tablet by mouth every morning.   . nebivolol (BYSTOLIC) 10 MG tablet Take 1 tablet (10 mg total) by mouth daily.  Marland Kitchen olmesartan-hydrochlorothiazide (BENICAR HCT) 40-12.5 MG tablet TAKE 1 TABLET DAILY  . omeprazole (PRILOSEC) 40 MG capsule Take 1 capsule (40 mg total) by mouth daily.  . pantoprazole (PROTONIX) 40 MG tablet TAKE 1 TABLET BY MOUTH ONCE DAILY (Patient not taking: Reported on 04/08/2019)  . silodosin (RAPAFLO) 8 MG CAPS capsule Take 8 mg by mouth at bedtime.  . solifenacin (VESICARE) 5 MG tablet Take 1 tablet (5 mg total) by mouth daily.   No current facility-administered medications for this visit. (Other)      REVIEW OF SYSTEMS:    ALLERGIES Allergies  Allergen Reactions  . Merthiolate [Thimerosal] Rash  Rash--local    PAST MEDICAL HISTORY Past Medical History:  Diagnosis Date  . BPH (benign prostatic hypertrophy)   . GERD (gastroesophageal reflux disease)   . H/O hiatal hernia   . History of colon polyps   . Hyperlipidemia   . Hypertension   . Knee internal derangement    RIGHT  . Mild asthma   . Vitamin D deficiency   . Wears glasses    Past Surgical History:  Procedure Laterality Date  . COLONOSCOPY W/ POLYPECTOMY  01-21-2008  . ESOPHAGOGASTRODUODENOSCOPY  11-15-2008  . KNEE ARTHROSCOPY Right X4  last one 2005  . KNEE ARTHROSCOPY Right 12/06/2013   Procedure: ARTHROSCOPY RIGHT KNEE WITH DEBRIDMENT AND PARTIAL MEDIAL AND LATERAL MENISCECTOMY AND CHONDRLPLASTY;  Surgeon: [name], MD;  Location: [facility];  Service:  Orthopedics;  Laterality: Right;  . LUMBAR DISC SURGERY  04-02-2007   L5 -- S1  . NEGATIVE SLEEP STUDY  per pt  . PARATHYROIDECTOMY  1989   removal adenoma    FAMILY HISTORY Family History  Problem Relation Age of Onset  . Hyperlipidemia Mother   . Coronary artery disease Mother   . Pulmonary embolism Father   . Colon cancer Father   . Hyperlipidemia Brother   . Pancreatic cancer Brother     SOCIAL HISTORY Social History   Tobacco Use  . Smoking status: Never Smoker  . Smokeless tobacco: Never Used  Substance Use Topics  . Alcohol use: Yes    Alcohol/week: 0.0 standard drinks    Comment: social use  . Drug use: No         OPHTHALMIC EXAM:  Base Eye Exam    Visual Acuity (ETDRS)      Right Left   Dist [place] 20/80 +1    Dist cc  20/20   Dist ph [place] 20/70 +1    Correction: Glasses       Tonometry (Tonopen, 3:37 PM)      Right Left   Pressure 14 13       Pupils      Dark Light Shape React APD   Right 5 4 Round Slow Trace   Left 4 3 Round Slow None       Visual Fields (Counting fingers)      Left Right    Full    Restrictions  Total superior nasal deficiency       Extraocular Movement      Right Left    Full Full       Neuro/Psych    Oriented x3: Yes   Mood/Affect: Normal       Dilation    Right eye: 1.0% Mydriacyl, 2.5% Phenylephrine @ 3:39 PM        Slit Lamp and Fundus Exam    External Exam      Right Left   External Normal Normal       Slit Lamp Exam      Right Left   Lids/Lashes Normal Normal   Conjunctiva/Sclera White and quiet White and quiet   Cornea Clear Clear   Anterior Chamber Deep and quiet Deep and quiet   Iris Round and reactive Round and reactive   Lens Posterior chamber intraocular lens    Anterior Vitreous Normal Normal       Fundus Exam      Right Left   Posterior Vitreous Clear, vitrectomized, gas 40%    Disc Normal    C/D Ratio 0.25    Macula No  topographic distortion, attached, no vertical contraction  remains of the arcades temporally    Vessels Normal    Periphery Former inferonasal retinal detachment, well demarcated and treated in the bed of detachment post retinotomy, retinectomy inferonasal.  Attached, with a great view in the upright position  position with 25 diopter lens           IMAGING AND PROCEDURES  Imaging and Procedures for 12/22/19  OCT, Retina - OU - Both Eyes       Right Eye Quality was good. Scan locations included subfoveal. Central Foveal Thickness: 383. Progression has improved. Findings include abnormal foveal contour.   Left Eye Quality was good. Scan locations included subfoveal. Central Foveal Thickness: 295. Progression has been stable.   Notes Macula attached, much less topographic distortion early view of the normal foveal avascular zone now present There may be minor intraretinal fluid, CME for which we will commence with topical nonsteroidals                ASSESSMENT/PLAN:  Cystoid macular degeneration of right eye The minor intraretinal fluid and CME macula most likely post macular pucker changes yet to enhance healing will commence with topical nonsteroidal, and to maximize compliance, once daily use, will offer PROLENSA once daily, sample bottles given  Macular pucker, right eye OD, vastly improved status post surgical intervention      ICD-10-CM   1. Macular pucker, right eye  H35.371 OCT, Retina - OU - Both Eyes  2. Cystoid macular degeneration of right eye  H35.351     1.  Patient instructed right eye to use   topical prednisolone acetate 1 drop right eye 1 times daily Ofloxacin 1 drop right eye 1 times daily  Use each of these medications for 10 more days or if completed do not refill.    2.   for healing purposes topical PROLENSA, 1 drop daily will be started today, samples provided fully this will assist with what could be minor cystoid macular edema in the right eye  3.  Ophthalmic Meds Ordered this visit:  No  orders of the defined types were placed in this encounter.      Return in about 6 weeks (around 02/02/2020) for POST OP, dilate, OD, OCT.  There are no Patient Instructions on file for this visit.   Explained the diagnoses, plan, and follow up with the patient and they expressed understanding.  Patient expressed understanding of the importance of proper follow up care.   Clent Demark Abanoub Hanken M.D. Diseases & Surgery of the Retina and Vitreous Retina & Diabetic [place] 12/22/19     Abbreviations: M myopia (nearsighted); A astigmatism; H hyperopia (farsighted); P presbyopia; Mrx spectacle prescription;  CTL contact lenses; OD right eye; OS left eye; OU both eyes  XT exotropia; ET esotropia; PEK punctate epithelial keratitis; PEE punctate epithelial erosions; DES dry eye syndrome; MGD meibomian gland dysfunction; ATs artificial tears; PFAT's preservative free artificial tears; [place] nuclear sclerotic cataract; PSC posterior subcapsular cataract; ERM epi-retinal membrane; PVD posterior vitreous detachment; RD retinal detachment; DM diabetes mellitus; DR diabetic retinopathy; NPDR non-proliferative diabetic retinopathy; PDR proliferative diabetic retinopathy; CSME clinically significant macular edema; DME diabetic macular edema; dbh dot blot hemorrhages; CWS cotton wool spot; POAG primary open angle glaucoma; C/D cup-to-disc ratio; HVF humphrey visual field; GVF goldmann visual field; OCT optical coherence tomography; IOP intraocular pressure; BRVO Branch retinal vein occlusion; CRVO central retinal vein occlusion; CRAO central retinal artery occlusion; BRAO branch retinal artery occlusion; RT  retinal tear; SB scleral buckle; PPV pars plana vitrectomy; VH Vitreous hemorrhage; PRP panretinal laser photocoagulation; IVK intravitreal kenalog; VMT vitreomacular traction; MH Macular hole;  NVD neovascularization of the disc; NVE neovascularization elsewhere; AREDS age related eye disease study; ARMD age related  macular degeneration; POAG primary open angle glaucoma; EBMD epithelial/anterior basement membrane dystrophy; ACIOL anterior chamber intraocular lens; IOL intraocular lens; PCIOL posterior chamber intraocular lens; Phaco/IOL phacoemulsification with intraocular lens placement; [place] photorefractive keratectomy; LASIK laser assisted in situ keratomileusis; HTN hypertension; DM diabetes mellitus; COPD chronic obstructive pulmonary disease

## 2020-01-05 ENCOUNTER — Other Ambulatory Visit: Payer: Self-pay | Admitting: Family Medicine

## 2020-01-05 ENCOUNTER — Encounter: Payer: Self-pay | Admitting: Family Medicine

## 2020-01-05 DIAGNOSIS — E041 Nontoxic single thyroid nodule: Secondary | ICD-10-CM

## 2020-01-30 ENCOUNTER — Ambulatory Visit (HOSPITAL_BASED_OUTPATIENT_CLINIC_OR_DEPARTMENT_OTHER)
Admission: RE | Admit: 2020-01-30 | Discharge: 2020-01-30 | Disposition: A | Discharge status: Home / Self Care | Payer: Federal, State, Local not specified - PPO | Source: Ambulatory Visit | Attending: Family Medicine | Admitting: Family Medicine

## 2020-01-30 ENCOUNTER — Encounter (INDEPENDENT_AMBULATORY_CARE_PROVIDER_SITE_OTHER): Payer: Federal, State, Local not specified - PPO | Admitting: Ophthalmology

## 2020-01-30 ENCOUNTER — Other Ambulatory Visit: Payer: Self-pay

## 2020-01-30 DIAGNOSIS — E041 Nontoxic single thyroid nodule: Secondary | ICD-10-CM | POA: Insufficient documentation

## 2020-02-02 ENCOUNTER — Other Ambulatory Visit: Payer: Self-pay

## 2020-02-02 ENCOUNTER — Encounter (INDEPENDENT_AMBULATORY_CARE_PROVIDER_SITE_OTHER): Payer: Self-pay | Admitting: Ophthalmology

## 2020-02-02 ENCOUNTER — Encounter (INDEPENDENT_AMBULATORY_CARE_PROVIDER_SITE_OTHER): Payer: Federal, State, Local not specified - PPO | Admitting: Ophthalmology

## 2020-02-02 ENCOUNTER — Ambulatory Visit (INDEPENDENT_AMBULATORY_CARE_PROVIDER_SITE_OTHER): Payer: Federal, State, Local not specified - PPO | Admitting: Ophthalmology

## 2020-02-02 DIAGNOSIS — H35351 Cystoid macular degeneration, right eye: Secondary | ICD-10-CM

## 2020-02-02 DIAGNOSIS — H35371 Puckering of macula, right eye: Secondary | ICD-10-CM | POA: Diagnosis not present

## 2020-02-02 DIAGNOSIS — H532 Diplopia: Secondary | ICD-10-CM

## 2020-02-02 NOTE — Progress Notes (Signed)
02/02/2020     CHIEF COMPLAINT Patient presents for Post-op Follow-up (6 Week POV OD//Pt reports he has been trying to not wear the patch over OD as much, but sts he feels double VA is worse. Pt reports pressure sensation off and on. 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  Vision is improved. Additional comments: 6 Week POV OD  Pt reports he has been trying to not wear the patch over OD as much, but sts he feels double VA is worse. Pt reports pressure sensation off and on. Pt sts VA feels constricted OD.       Last edited by [name], [place] on 02/02/2020  9:35 AM. (History)      Referring physician: [name], [name], DO [address] [place],  [place] [address]  HISTORICAL INFORMATION:   Selected notes from the MEDICAL RECORD NUMBER    Lab Results  Component Value Date   HGBA1C  12/09/2006    5.6 (NOTE)   The ADA recommends the following therapeutic goals for glycemic   control related to Hgb A1C measurement:   Goal of Therapy:   < 7.0% Hgb A1C   Action Suggested:  > 8.0% Hgb A1C   Ref:  Diabetes Care, 22, Suppl. 1, 1999     CURRENT MEDICATIONS: Current Outpatient Medications (Ophthalmic Drugs)  Medication Sig  . Bromfenac Sodium (PROLENSA) 0.07 % SOLN Apply to eye.  . loteprednol (LOTEMAX) 0.5 % ophthalmic suspension Place 1 drop into the right eye in the morning and at bedtime.  Marland Kitchen ofloxacin (OCUFLOX) 0.3 % ophthalmic solution Place 1 drop into the right eye 4 (four) times daily. (Patient not taking: Reported on 02/02/2020)  . prednisoLONE acetate (PRED FORTE) 1 % ophthalmic suspension SHAKE LIQUID AND INSTILL 1 DROP IN RIGHT EYE FOUR TIMES DAILY (Patient not taking: Reported on 02/02/2020)  . prednisoLONE acetate (PRED FORTE) 1 % ophthalmic suspension Place 1 drop into the right eye 4 (four) times daily.  (Patient not taking: Reported on 02/02/2020)   No current facility-administered medications for this visit. (Ophthalmic Drugs)   Current Outpatient Medications (Other)  Medication Sig  . albuterol (VENTOLIN HFA) 108 (90 Base) MCG/ACT inhaler Inhale 2 puffs into the lungs every 6 (six) hours as needed for wheezing or shortness of breath.  . ALBUTEROL IN Inhale into the lungs as needed.  . Albuterol Sulfate 2.5 MG/0.5ML NEBU Inhale into the lungs. 1 nebule every 6 hours as needed  . atorvastatin (LIPITOR) 40 MG tablet TAKE 1 TABLET EVERY MORNING  . BREO ELLIPTA 100-25 MCG/INH AEPB USE 1 INHALATION ORALLY    DAILY  . Cholecalciferol (VITAMIN D) 50 MCG (2000 UT) tablet Take 2,000 Units by mouth daily.  . Coenzyme Q10 (CO Q 10 PO) Take 1 tablet by mouth daily.  . Multiple Vitamin (MULTIVITAMIN) tablet Take 1 tablet by mouth every morning.   . nebivolol (BYSTOLIC) 10 MG tablet Take 1 tablet (10 mg total) by mouth daily.  Marland Kitchen olmesartan-hydrochlorothiazide (BENICAR HCT) 40-12.5 MG tablet TAKE 1 TABLET DAILY  . omeprazole (PRILOSEC) 40 MG capsule Take 1 capsule (40 mg total) by mouth daily.  . pantoprazole (PROTONIX) 40 MG tablet TAKE 1 TABLET BY MOUTH ONCE DAILY (Patient not taking: Reported on 04/08/2019)  . silodosin (RAPAFLO) 8  MG CAPS capsule Take 8 mg by mouth at bedtime.  . solifenacin (VESICARE) 5 MG tablet Take 1 tablet (5 mg total) by mouth daily.   No current facility-administered medications for this visit. (Other)      REVIEW OF SYSTEMS:    ALLERGIES Allergies  Allergen Reactions  . Merthiolate [Thimerosal] Rash    Rash--local    PAST MEDICAL HISTORY Past Medical History:  Diagnosis Date  . BPH (benign prostatic hypertrophy)   . GERD (gastroesophageal reflux disease)   . H/O hiatal hernia   . History of colon polyps   . Hyperlipidemia   . Hypertension   . Knee internal derangement    RIGHT  . Mild asthma   . Vitamin D deficiency   . Wears glasses    Past Surgical  History:  Procedure Laterality Date  . COLONOSCOPY W/ POLYPECTOMY  01-21-2008  . ESOPHAGOGASTRODUODENOSCOPY  11-15-2008  . KNEE ARTHROSCOPY Right X4  last one 2005  . KNEE ARTHROSCOPY Right 12/06/2013   Procedure: ARTHROSCOPY RIGHT KNEE WITH DEBRIDMENT AND PARTIAL MEDIAL AND LATERAL MENISCECTOMY AND CHONDRLPLASTY;  Surgeon: [name], MD;  Location: [facility];  Service: Orthopedics;  Laterality: Right;  . LUMBAR DISC SURGERY  04-02-2007   L5 -- S1  . NEGATIVE SLEEP STUDY  per pt  . PARATHYROIDECTOMY  1989   removal adenoma    FAMILY HISTORY Family History  Problem Relation Age of Onset  . Hyperlipidemia Mother   . Coronary artery disease Mother   . Pulmonary embolism Father   . Colon cancer Father   . Hyperlipidemia Brother   . Pancreatic cancer Brother     SOCIAL HISTORY Social History   Tobacco Use  . Smoking status: Never Smoker  . Smokeless tobacco: Never Used  Substance Use Topics  . Alcohol use: Yes    Alcohol/week: 0.0 standard drinks    Comment: social use  . Drug use: No         OPHTHALMIC EXAM: Base Eye Exam    Visual Acuity (ETDRS)      Right Left   Dist [place] 20/50 -1    Dist cc  20/20 -1   Dist ph [place] NI    Correction: Glasses       Tonometry (Tonopen, 9:36 AM)      Right Left   Pressure 13 15       Pupils      Dark Light Shape React APD   Right 4 3 Round Slow Trace   Left 3 2 Round Slow None       Visual Fields (Counting fingers)      Left Right    Full    Restrictions  Total superior nasal deficiency       Extraocular Movement      Right Left    Full Full       Neuro/Psych    Oriented x3: Yes   Mood/Affect: Normal       Dilation    Right eye: 1.0% Mydriacyl, 2.5% Phenylephrine @ 9:40 AM        Slit Lamp and Fundus Exam    External Exam      Right Left   External Normal Normal       Slit Lamp Exam      Right Left   Lids/Lashes Normal Normal   Conjunctiva/Sclera White and quiet White and quiet    Cornea Clear Clear   Anterior Chamber Deep and quiet Deep and quiet  Iris Round and reactive Round and reactive   Lens Posterior chamber intraocular lens    Anterior Vitreous Normal Normal       Fundus Exam      Right Left   Posterior Vitreous Clear, vitrectomized, gas 40%    Disc Normal    C/D Ratio 0.25    Macula No topographic distortion, attached, no vertical contraction remains of the arcades temporally    Vessels Normal    Periphery Former inferonasal retinal detachment, well demarcated and treated in the bed of detachment post retinotomy, retinectomy inferonasal.  Attached, with a great view in the upright position  position with 25 diopter lens           IMAGING AND PROCEDURES  Imaging and Procedures for 02/02/20  OCT, Retina - OU - Both Eyes       Right Eye Quality was good. Scan locations included subfoveal. Central Foveal Thickness: 242. Progression has improved. Findings include abnormal foveal contour, outer retinal atrophy, central retinal atrophy.   Left Eye Quality was good. Scan locations included subfoveal. Central Foveal Thickness: 293. Progression has been stable. Findings include normal foveal contour.   Notes Much less topographic distortion, some foveal atrophy exist centrally OD no epiretinal membrane in the macular region for well two disc diameters radius                ASSESSMENT/PLAN:  Macular pucker, right eye Postop vitrectomy, membrane peel, removal of silicone oil, looks great with visual acuity slowly improving further.    Cystoid macular degeneration of right eye Minor intraretinal CME post vitrectomy membrane peel component of recovery, improved and stable, better acuity as well now to 20/50 uncorrected.  Patient continues on PROLENSA 1 drop nightly      ICD-10-CM   1. Macular pucker, right eye  H35.371 OCT, Retina - OU - Both Eyes  2. Binocular vision disorder with diplopia  H53.2   3. Cystoid macular degeneration of  right eye  H35.351     1.  OD, visual acuity improving now status post vitrectomy membrane peel removed silicone oil and localized repair of tractional detachment inferiorly, status post prior complex retinal detachment repair    2.  Patient OD is pseudophakic now with 20/50 acuity uncorrected yet having complaints of diplopia some vertical some horizontal.  This is using his prior lens prescription.  I explained the patient that variable or incorrect prescription where will lead to anisometropia, and anisokienia   3.  I have asked the patient return to [facility] eye care for complete refractive evaluation and potential balancing, which could help with allowing binocular fusion to recover without prism needs.  Ophthalmic Meds Ordered this visit:  No orders of the defined types were placed in this encounter.      Return in about 3 months (around 05/02/2020) for dilate, DILATE OU, COLOR FP, OCT.  There are no Patient Instructions on file for this visit.   Explained the diagnoses, plan, and follow up with the patient and they expressed understanding.  Patient expressed understanding of the importance of proper follow up care.   [name] [place] Charls Custer M.D. Diseases & Surgery of the Retina and Vitreous [facility] 02/02/20     Abbreviations: M myopia (nearsighted); A astigmatism; H hyperopia (farsighted); P presbyopia; Mrx spectacle prescription;  CTL contact lenses; OD right eye; OS left eye; OU both eyes  XT exotropia; ET esotropia; PEK punctate epithelial keratitis; PEE punctate epithelial erosions; DES dry eye syndrome; MGD  meibomian gland dysfunction; ATs artificial tears; PFAT's preservative free artificial tears; [place] nuclear sclerotic cataract; PSC posterior subcapsular cataract; ERM epi-retinal membrane; PVD posterior vitreous detachment; RD retinal detachment; DM diabetes mellitus; DR diabetic retinopathy; NPDR non-proliferative diabetic retinopathy; PDR proliferative  diabetic retinopathy; CSME clinically significant macular edema; DME diabetic macular edema; dbh dot blot hemorrhages; CWS cotton wool spot; POAG primary open angle glaucoma; C/D cup-to-disc ratio; HVF humphrey visual field; GVF goldmann visual field; OCT optical coherence tomography; IOP intraocular pressure; BRVO Branch retinal vein occlusion; CRVO central retinal vein occlusion; CRAO central retinal artery occlusion; BRAO branch retinal artery occlusion; RT retinal tear; SB scleral buckle; PPV pars plana vitrectomy; VH Vitreous hemorrhage; PRP panretinal laser photocoagulation; IVK intravitreal kenalog; VMT vitreomacular traction; MH Macular hole;  NVD neovascularization of the disc; NVE neovascularization elsewhere; AREDS age related eye disease study; ARMD age related macular degeneration; POAG primary open angle glaucoma; EBMD epithelial/anterior basement membrane dystrophy; ACIOL anterior chamber intraocular lens; IOL intraocular lens; PCIOL posterior chamber intraocular lens; Phaco/IOL phacoemulsification with intraocular lens placement; [place] photorefractive keratectomy; LASIK laser assisted in situ keratomileusis; HTN hypertension; DM diabetes mellitus; COPD chronic obstructive pulmonary disease

## 2020-02-02 NOTE — Assessment & Plan Note (Signed)
Postop vitrectomy, membrane peel, removal of silicone oil, looks great with visual acuity slowly improving further.

## 2020-02-02 NOTE — Assessment & Plan Note (Signed)
Minor intraretinal CME post vitrectomy membrane peel component of recovery, improved and stable, better acuity as well now to 20/50 uncorrected.  Patient continues on PROLENSA 1 drop nightly

## 2020-02-16 ENCOUNTER — Other Ambulatory Visit: Payer: Self-pay | Admitting: Family Medicine

## 2020-02-16 DIAGNOSIS — K219 Gastro-esophageal reflux disease without esophagitis: Secondary | ICD-10-CM

## 2020-02-23 ENCOUNTER — Encounter (INDEPENDENT_AMBULATORY_CARE_PROVIDER_SITE_OTHER): Payer: Self-pay

## 2020-02-23 DIAGNOSIS — H547 Unspecified visual loss: Secondary | ICD-10-CM | POA: Diagnosis not present

## 2020-02-23 DIAGNOSIS — H532 Diplopia: Secondary | ICD-10-CM | POA: Diagnosis not present

## 2020-02-23 DIAGNOSIS — H2513 Age-related nuclear cataract, bilateral: Secondary | ICD-10-CM | POA: Diagnosis not present

## 2020-03-19 ENCOUNTER — Encounter: Payer: Self-pay | Admitting: Family Medicine

## 2020-03-19 ENCOUNTER — Encounter (INDEPENDENT_AMBULATORY_CARE_PROVIDER_SITE_OTHER): Payer: Self-pay | Admitting: Ophthalmology

## 2020-03-20 ENCOUNTER — Other Ambulatory Visit: Payer: Self-pay | Admitting: Family Medicine

## 2020-03-20 DIAGNOSIS — Z0000 Encounter for general adult medical examination without abnormal findings: Secondary | ICD-10-CM

## 2020-03-20 DIAGNOSIS — M25571 Pain in right ankle and joints of right foot: Secondary | ICD-10-CM

## 2020-03-20 DIAGNOSIS — M25572 Pain in left ankle and joints of left foot: Secondary | ICD-10-CM

## 2020-04-05 ENCOUNTER — Encounter (INDEPENDENT_AMBULATORY_CARE_PROVIDER_SITE_OTHER): Payer: Self-pay

## 2020-04-05 DIAGNOSIS — H50411 Cyclotropia, right eye: Secondary | ICD-10-CM | POA: Diagnosis not present

## 2020-04-05 DIAGNOSIS — H5034 Intermittent alternating exotropia: Secondary | ICD-10-CM | POA: Diagnosis not present

## 2020-04-05 DIAGNOSIS — H5022 Vertical strabismus, left eye: Secondary | ICD-10-CM | POA: Diagnosis not present

## 2020-04-10 ENCOUNTER — Other Ambulatory Visit: Payer: Self-pay | Admitting: Family Medicine

## 2020-04-10 ENCOUNTER — Other Ambulatory Visit (INDEPENDENT_AMBULATORY_CARE_PROVIDER_SITE_OTHER): Payer: Federal, State, Local not specified - PPO

## 2020-04-10 DIAGNOSIS — M25572 Pain in left ankle and joints of left foot: Secondary | ICD-10-CM

## 2020-04-10 DIAGNOSIS — M25571 Pain in right ankle and joints of right foot: Secondary | ICD-10-CM

## 2020-04-10 DIAGNOSIS — M1A0790 Idiopathic chronic gout, unspecified ankle and foot, without tophus (tophi): Secondary | ICD-10-CM

## 2020-04-10 DIAGNOSIS — Z125 Encounter for screening for malignant neoplasm of prostate: Secondary | ICD-10-CM | POA: Diagnosis not present

## 2020-04-10 DIAGNOSIS — Z0000 Encounter for general adult medical examination without abnormal findings: Secondary | ICD-10-CM

## 2020-04-10 LAB — COMPREHENSIVE METABOLIC PANEL
ALT: 30 U/L (ref 0–53)
AST: 18 U/L (ref 0–37)
Albumin: 4 g/dL (ref 3.5–5.2)
Alkaline Phosphatase: 82 U/L (ref 39–117)
BUN: 19 mg/dL (ref 6–23)
CO2: 27 mEq/L (ref 19–32)
Calcium: 9.6 mg/dL (ref 8.4–10.5)
Chloride: 104 mEq/L (ref 96–112)
Creatinine, Ser: 1.1 mg/dL (ref 0.40–1.50)
GFR: 72.4 mL/min (ref >60.00)
Glucose, Bld: 106 mg/dL — ABNORMAL HIGH (ref 70–99)
Potassium: 3.9 mEq/L (ref 3.5–5.1)
Sodium: 141 mEq/L (ref 135–145)
Total Bilirubin: 0.4 mg/dL (ref 0.2–1.2)
Total Protein: 6.7 g/dL (ref 6.0–8.3)

## 2020-04-10 LAB — CBC WITH DIFFERENTIAL/PLATELET
Basophils Absolute: 0 10*3/uL (ref 0.0–0.1)
Basophils Relative: 0.5 % (ref 0.0–3.0)
Eosinophils Absolute: 0.5 10*3/uL (ref 0.0–0.7)
Eosinophils Relative: 7.1 % — ABNORMAL HIGH (ref 0.0–5.0)
HCT: 41.1 % (ref 39.0–52.0)
Hemoglobin: 14 g/dL (ref 13.0–17.0)
Lymphocytes Relative: 23.7 % (ref 12.0–46.0)
Lymphs Abs: 1.7 10*3/uL (ref 0.7–4.0)
MCHC: 34.2 g/dL (ref 30.0–36.0)
MCV: 85.3 fl (ref 78.0–100.0)
Monocytes Absolute: 0.7 10*3/uL (ref 0.1–1.0)
Monocytes Relative: 9.3 % (ref 3.0–12.0)
Neutro Abs: 4.2 10*3/uL (ref 1.4–7.7)
Neutrophils Relative %: 59.4 % (ref 43.0–77.0)
Platelets: 288 10*3/uL (ref 150.0–400.0)
RBC: 4.81 Mil/uL (ref 4.22–5.81)
RDW: 14.1 % (ref 11.5–15.5)
WBC: 7.1 10*3/uL (ref 4.0–10.5)

## 2020-04-10 LAB — LIPID PANEL
Cholesterol: 185 mg/dL (ref 0–200)
HDL: 38.1 mg/dL — ABNORMAL LOW (ref >39.00)
LDL Cholesterol: 110 mg/dL — ABNORMAL HIGH (ref 0–99)
NonHDL: 146.43
Total CHOL/HDL Ratio: 5
Triglycerides: 183 mg/dL — ABNORMAL HIGH (ref 0.0–149.0)
VLDL: 36.6 mg/dL (ref 0.0–40.0)

## 2020-04-10 LAB — PSA: PSA: 6.61 ng/mL — ABNORMAL HIGH (ref 0.10–4.00)

## 2020-04-10 LAB — URIC ACID: Uric Acid, Serum: 7.9 mg/dL — ABNORMAL HIGH (ref 4.0–7.8)

## 2020-04-10 LAB — TSH: TSH: 1.51 u[IU]/mL (ref 0.35–4.50)

## 2020-04-11 ENCOUNTER — Other Ambulatory Visit: Payer: Self-pay

## 2020-04-11 MED ORDER — ALLOPURINOL 100 MG PO TABS
100.0000 mg | ORAL_TABLET | Freq: Every day | ORAL | 2 refills | Status: DC
Start: 2020-04-11 — End: 2020-05-24

## 2020-04-13 DIAGNOSIS — R350 Frequency of micturition: Secondary | ICD-10-CM | POA: Diagnosis not present

## 2020-04-13 DIAGNOSIS — N401 Enlarged prostate with lower urinary tract symptoms: Secondary | ICD-10-CM | POA: Diagnosis not present

## 2020-04-13 DIAGNOSIS — R3915 Urgency of urination: Secondary | ICD-10-CM | POA: Diagnosis not present

## 2020-04-13 DIAGNOSIS — C61 Malignant neoplasm of prostate: Secondary | ICD-10-CM | POA: Diagnosis not present

## 2020-04-16 DIAGNOSIS — H5022 Vertical strabismus, left eye: Secondary | ICD-10-CM | POA: Diagnosis not present

## 2020-04-16 DIAGNOSIS — H5034 Intermittent alternating exotropia: Secondary | ICD-10-CM | POA: Diagnosis not present

## 2020-04-26 DIAGNOSIS — L570 Actinic keratosis: Secondary | ICD-10-CM | POA: Diagnosis not present

## 2020-04-26 DIAGNOSIS — D485 Neoplasm of uncertain behavior of skin: Secondary | ICD-10-CM | POA: Diagnosis not present

## 2020-05-03 ENCOUNTER — Encounter (INDEPENDENT_AMBULATORY_CARE_PROVIDER_SITE_OTHER): Payer: Self-pay | Admitting: Ophthalmology

## 2020-05-03 ENCOUNTER — Ambulatory Visit (INDEPENDENT_AMBULATORY_CARE_PROVIDER_SITE_OTHER): Payer: Federal, State, Local not specified - PPO | Admitting: Ophthalmology

## 2020-05-03 ENCOUNTER — Other Ambulatory Visit: Payer: Self-pay

## 2020-05-03 DIAGNOSIS — H35371 Puckering of macula, right eye: Secondary | ICD-10-CM

## 2020-05-03 DIAGNOSIS — H35351 Cystoid macular degeneration, right eye: Secondary | ICD-10-CM | POA: Diagnosis not present

## 2020-05-03 DIAGNOSIS — H3341 Traction detachment of retina, right eye: Secondary | ICD-10-CM | POA: Diagnosis not present

## 2020-05-03 DIAGNOSIS — H532 Diplopia: Secondary | ICD-10-CM

## 2020-05-03 NOTE — Assessment & Plan Note (Signed)
Patient with still has complaints with even with the recent prism fitting of glasses with Dr. Marquette Saa of unable to fuse properly.  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

## 2020-05-03 NOTE — Assessment & Plan Note (Signed)
Macular edema in the right eye has resolved.

## 2020-05-03 NOTE — Progress Notes (Signed)
05/03/2020     CHIEF COMPLAINT Patient presents for Retina Follow Up (3 Mo F/U OU//Pt c/o difficulty seeing with new glasses OD, due to "blurry image being closer to clear image." Pt sts he decided against prism glasses for now, and would consider re-evaluating if he decides to retire. Pt sts "something feels like it's constricting the eye in the socket" OD. Pt sts it feels like OD is "being pushed back." VA stable OS, but sts he is noticing more floaters. 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  In right eye.  This started 3 months ago.  Severity is mild.  Duration of 3 months.  Since onset it is stable. Additional comments: 3 Mo F/U OU  Pt c/o difficulty seeing with new glasses OD, due to "blurry image being closer to clear image." Pt sts he decided against prism glasses for now, and would consider re-evaluating if he decides to retire. Pt sts "something feels like it's constricting the eye in the socket" OD. Pt sts it feels like OD is "being pushed back." VA stable OS, but sts he is noticing more floaters. Pt sts he cannot tell if the floaters OS are new or not.       Last edited by [name], [place] on 05/03/2020  3:53 PM. (History)      Referring physician: [name], [name], DO [address] [place],  [place] [address]  HISTORICAL INFORMATION:   Selected notes from the MEDICAL RECORD NUMBER    Lab Results  Component Value Date   HGBA1C  12/09/2006    5.6 (NOTE)   The ADA recommends the following therapeutic goals for glycemic   control related to Hgb A1C measurement:   Goal of Therapy:   < 7.0% Hgb A1C   Action Suggested:  > 8.0% Hgb A1C   Ref:  Diabetes Care, 22, Suppl. 1, 1999     CURRENT MEDICATIONS: Current Outpatient Medications (Ophthalmic Drugs)  Medication Sig  .  Bromfenac Sodium (PROLENSA) 0.07 % SOLN Apply to eye. (Patient not taking: Reported on 05/03/2020)  . loteprednol (LOTEMAX) 0.5 % ophthalmic suspension Place 1 drop into the right eye in the morning and at bedtime. (Patient not taking: Reported on 05/03/2020)  . ofloxacin (OCUFLOX) 0.3 % ophthalmic solution Place 1 drop into the right eye 4 (four) times daily. (Patient not taking: No sig reported)  . prednisoLONE acetate (PRED FORTE) 1 % ophthalmic suspension SHAKE LIQUID AND INSTILL 1 DROP IN RIGHT EYE FOUR TIMES DAILY (Patient not taking: Reported on 02/02/2020)  . prednisoLONE acetate (PRED FORTE) 1 % ophthalmic suspension Place 1 drop into the right eye 4 (four) times daily. (Patient not taking: No sig reported)   No current facility-administered medications for this visit. (Ophthalmic Drugs)   Current Outpatient Medications (Other)  Medication Sig  . albuterol (VENTOLIN HFA) 108 (90 Base) MCG/ACT inhaler Inhale 2 puffs into the lungs every 6 (six) hours as needed for wheezing or shortness of breath.  . ALBUTEROL IN Inhale into the lungs as needed.  . Albuterol Sulfate 2.5 MG/0.5ML NEBU Inhale into the lungs. 1 nebule every 6 hours as needed  . allopurinol (ZYLOPRIM) 100 MG tablet Take 1 tablet (100 mg total)  by mouth daily.  Marland Kitchen atorvastatin (LIPITOR) 40 MG tablet TAKE 1 TABLET EVERY MORNING  . BREO ELLIPTA 100-25 MCG/INH AEPB USE 1 INHALATION ORALLY    DAILY  . Cholecalciferol (VITAMIN D) 50 MCG (2000 UT) tablet Take 2,000 Units by mouth daily.  . Coenzyme Q10 (CO Q 10 PO) Take 1 tablet by mouth daily.  . Multiple Vitamin (MULTIVITAMIN) tablet Take 1 tablet by mouth every morning.   . nebivolol (BYSTOLIC) 10 MG tablet Take 1 tablet (10 mg total) by mouth daily.  Marland Kitchen olmesartan-hydrochlorothiazide (BENICAR HCT) 40-12.5 MG tablet TAKE 1 TABLET DAILY  . omeprazole (PRILOSEC) 40 MG capsule TAKE 1 CAPSULE DAILY  . pantoprazole (PROTONIX) 40 MG tablet TAKE 1 TABLET BY MOUTH ONCE DAILY (Patient not  taking: Reported on 04/08/2019)  . silodosin (RAPAFLO) 8 MG CAPS capsule Take 8 mg by mouth at bedtime.  . solifenacin (VESICARE) 5 MG tablet Take 1 tablet (5 mg total) by mouth daily.   No current facility-administered medications for this visit. (Other)      REVIEW OF SYSTEMS:    ALLERGIES Allergies  Allergen Reactions  . Merthiolate [Thimerosal] Rash    Rash--local    PAST MEDICAL HISTORY Past Medical History:  Diagnosis Date  . BPH (benign prostatic hypertrophy)   . GERD (gastroesophageal reflux disease)   . H/O hiatal hernia   . History of colon polyps   . Hyperlipidemia   . Hypertension   . Knee internal derangement    RIGHT  . Mild asthma   . Vitamin D deficiency   . Wears glasses    Past Surgical History:  Procedure Laterality Date  . COLONOSCOPY W/ POLYPECTOMY  01-21-2008  . ESOPHAGOGASTRODUODENOSCOPY  11-15-2008  . KNEE ARTHROSCOPY Right X4  last one 2005  . KNEE ARTHROSCOPY Right 12/06/2013   Procedure: ARTHROSCOPY RIGHT KNEE WITH DEBRIDMENT AND PARTIAL MEDIAL AND LATERAL MENISCECTOMY AND CHONDRLPLASTY;  Surgeon: [name], MD;  Location: [facility];  Service: Orthopedics;  Laterality: Right;  . LUMBAR DISC SURGERY  04-02-2007   L5 -- S1  . NEGATIVE SLEEP STUDY  per pt  . PARATHYROIDECTOMY  1989   removal adenoma    FAMILY HISTORY Family History  Problem Relation Age of Onset  . Hyperlipidemia Mother   . Coronary artery disease Mother   . Pulmonary embolism Father   . Colon cancer Father   . Hyperlipidemia Brother   . Pancreatic cancer Brother     SOCIAL HISTORY Social History   Tobacco Use  . Smoking status: Never Smoker  . Smokeless tobacco: Never Used  Substance Use Topics  . Alcohol use: Yes    Alcohol/week: 0.0 standard drinks    Comment: social use  . Drug use: No         OPHTHALMIC EXAM: Base Eye Exam    Visual Acuity (ETDRS)      Right Left   Dist [place] 20/80ecc -3    Dist cc 20/100 +1 20/20 -1   Dist  ph [place] 20/60ecc -3    Dist ph cc 20/80 -2    Correction: Glasses       Tonometry (Tonopen, 3:56 PM)      Right Left   Pressure 12 13       Pupils      Pupils Dark Light Shape React APD   Right PERRL 4 3 Round Slow None   Left PERRL 4 3 Round Slow None       Visual Fields (Counting fingers)  Left Right    Full    Restrictions  Total superior nasal deficiency       Extraocular Movement      Right Left    Full Full       Neuro/Psych    Oriented x3: Yes   Mood/Affect: Normal       Dilation    Both eyes: 1.0% Mydriacyl, 2.5% Phenylephrine @ 3:59 PM        Slit Lamp and Fundus Exam    External Exam      Right Left   External Normal Normal       Slit Lamp Exam      Right Left   Lids/Lashes Normal Normal   Conjunctiva/Sclera White and quiet White and quiet   Cornea Clear Clear   Anterior Chamber Deep and quiet Deep and quiet   Iris Round and reactive Round and reactive   Lens Posterior chamber intraocular lens 1+ Nuclear sclerosis   Anterior Vitreous Normal Normal       Fundus Exam      Right Left   Posterior Vitreous Clear, vitrectomized,  Normal   Disc Normal, pink Normal   C/D Ratio 0.25 0.2   Macula No topographic distortion, attached, no vertical contraction remains of the arcades temporally Normal   Vessels Normal Normal   Periphery Former inferonasal retinal detachment, well demarcated and treated in the bed of detachment post retinotomy, retinectomy inferonasal.  Attached, with a great view in the upright position  position with 25 diopter lens 360 scleral depressed exam, 25D lens, no breaks          IMAGING AND PROCEDURES  Imaging and Procedures for 05/03/20  OCT, Retina - OU - Both Eyes       Right Eye Quality was good. Scan locations included subfoveal. Central Foveal Thickness: 241. Progression has improved. Findings include abnormal foveal contour, outer retinal atrophy, central retinal atrophy.   Left Eye Quality was good. Scan  locations included subfoveal. Central Foveal Thickness: 293. Progression has been stable. Findings include normal foveal contour.   Notes Much less topographic distortion, some foveal atrophy exist centrally OD no epiretinal membrane in the macular region for well two disc diameters radius  OS normal contour       Color Fundus Photography Optos - OU - Both Eyes       Right Eye Progression has been stable. Disc findings include normal observations.   Notes Clear media, no topographic distortion, good retinopexy, OD  Normal left eye                ASSESSMENT/PLAN:  Binocular vision disorder with diplopia Patient with still has complaints with even with the recent prism fitting of glasses with Dr. Marquette Saa of unable to fuse properly.  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  Macular pucker, right eye Macular pucker has resolved there is only minimal thickening on the nasal aspect of the papillomacular bundle  Traction detachment of right retina History of complex retinal detachment repairs multiple, now with oil removed.  Patient has residual macular macular issues as a result of the previous retinal detachment in the prior severe epiretinal membrane.  Visual functioning at 20/60 or so best corrected and monocular testing has reached likely  its best functional vision.  This may recover further, but not so appreciably.  Nonetheless the good acuity creates the binocular difficulty unable diffuse and have binocular vision      ICD-10-CM   1. Macular pucker, right eye  H35.371 OCT, Retina - OU - Both Eyes  2. Traction detachment of right retina  H33.41 Color Fundus Photography Optos - OU - Both Eyes  3. Binocular vision disorder with diplopia   H53.2   4. Cystoid macular degeneration of right eye  H35.351     1.  2.  3.  Ophthalmic Meds Ordered this visit:  No orders of the defined types were placed in this encounter.      Return in about 4 months (around 09/02/2020) for dilate, OD, COLOR FP, OCT.  There are no Patient Instructions on file for this visit.   Explained the diagnoses, plan, and follow up with the patient and they expressed understanding.  Patient expressed understanding of the importance of proper follow up care.   Clent Demark Lijah Bourque M.D. Diseases & Surgery of the Retina and Vitreous [facility] 05/03/20     Abbreviations: M myopia (nearsighted); A astigmatism; H hyperopia (farsighted); P presbyopia; Mrx spectacle prescription;  CTL contact lenses; OD right eye; OS left eye; OU both eyes  XT exotropia; ET esotropia; PEK punctate epithelial keratitis; PEE punctate epithelial erosions; DES dry eye syndrome; MGD meibomian gland dysfunction; ATs artificial tears; PFAT's preservative free artificial tears; [place] nuclear sclerotic cataract; PSC posterior subcapsular cataract; ERM epi-retinal membrane; PVD posterior vitreous detachment; RD retinal detachment; DM diabetes mellitus; DR diabetic retinopathy; NPDR non-proliferative diabetic retinopathy; PDR proliferative diabetic retinopathy; CSME clinically significant macular edema; DME diabetic macular edema; dbh dot blot hemorrhages; CWS cotton wool spot; POAG primary open angle glaucoma; C/D cup-to-disc ratio; HVF humphrey visual field; GVF goldmann visual field; OCT optical coherence tomography; IOP intraocular pressure; BRVO Branch retinal vein occlusion; CRVO central retinal vein occlusion; CRAO central retinal artery occlusion; BRAO branch retinal artery occlusion; RT retinal tear; SB scleral buckle; PPV pars plana vitrectomy; VH Vitreous hemorrhage; PRP panretinal laser photocoagulation; IVK intravitreal kenalog; VMT vitreomacular traction; MH Macular  hole;  NVD neovascularization of the disc; NVE neovascularization elsewhere; AREDS age related eye disease study; ARMD age related macular degeneration; POAG primary open angle glaucoma; EBMD epithelial/anterior basement membrane dystrophy; ACIOL anterior chamber intraocular lens; IOL intraocular lens; PCIOL posterior chamber intraocular lens; Phaco/IOL phacoemulsification with intraocular lens placement; [place] photorefractive keratectomy; LASIK laser assisted in situ keratomileusis; HTN hypertension; DM diabetes mellitus; COPD chronic obstructive pulmonary disease

## 2020-05-03 NOTE — Assessment & Plan Note (Signed)
Macular pucker has resolved there is only minimal thickening on the nasal aspect of the papillomacular bundle

## 2020-05-03 NOTE — Assessment & Plan Note (Signed)
History of complex retinal detachment repairs multiple, now with oil removed.  Patient has residual macular macular issues as a result of the previous retinal detachment in the prior severe epiretinal membrane.  Visual functioning at 20/60 or so best corrected and monocular testing has reached likely its best functional vision.  This may recover further, but not so appreciably.  Nonetheless the good acuity creates the binocular difficulty unable diffuse and have binocular vision

## 2020-05-07 ENCOUNTER — Other Ambulatory Visit: Payer: Self-pay | Admitting: Family Medicine

## 2020-05-24 ENCOUNTER — Encounter: Payer: Self-pay | Admitting: Family Medicine

## 2020-05-24 ENCOUNTER — Other Ambulatory Visit: Payer: Self-pay | Admitting: Family Medicine

## 2020-05-24 DIAGNOSIS — M1A0790 Idiopathic chronic gout, unspecified ankle and foot, without tophus (tophi): Secondary | ICD-10-CM

## 2020-05-24 MED ORDER — ALLOPURINOL 300 MG PO TABS: 300.0000 mg | ORAL_TABLET | Freq: Every day | ORAL | 3 refills | Status: DC

## 2020-05-24 NOTE — Telephone Encounter (Signed)
Ok to call in 90 days of of 300 mg allopurinol ------ 1 po qd #90  I sent it in

## 2020-05-24 NOTE — Telephone Encounter (Signed)
I sent it to [facility]--- he may want it sent to local pharmacy #[id number]

## 2020-05-25 ENCOUNTER — Other Ambulatory Visit: Payer: Self-pay

## 2020-05-25 DIAGNOSIS — M1A0790 Idiopathic chronic gout, unspecified ankle and foot, without tophus (tophi): Secondary | ICD-10-CM

## 2020-05-25 MED ORDER — ALLOPURINOL 300 MG PO TABS: 300.0000 mg | ORAL_TABLET | Freq: Every day | ORAL | 3 refills | Status: DC

## 2020-06-07 DIAGNOSIS — B353 Tinea pedis: Secondary | ICD-10-CM | POA: Diagnosis not present

## 2020-06-07 DIAGNOSIS — L720 Epidermal cyst: Secondary | ICD-10-CM | POA: Diagnosis not present

## 2020-06-07 DIAGNOSIS — D485 Neoplasm of uncertain behavior of skin: Secondary | ICD-10-CM | POA: Diagnosis not present

## 2020-06-07 DIAGNOSIS — D225 Melanocytic nevi of trunk: Secondary | ICD-10-CM | POA: Diagnosis not present

## 2020-06-07 DIAGNOSIS — B351 Tinea unguium: Secondary | ICD-10-CM | POA: Diagnosis not present

## 2020-06-19 ENCOUNTER — Encounter: Payer: Self-pay | Admitting: Family Medicine

## 2020-06-21 ENCOUNTER — Encounter: Payer: Federal, State, Local not specified - PPO | Admitting: Family Medicine

## 2020-07-09 ENCOUNTER — Other Ambulatory Visit: Payer: Self-pay | Admitting: Family Medicine

## 2020-07-09 DIAGNOSIS — I10 Essential (primary) hypertension: Secondary | ICD-10-CM

## 2020-07-09 DIAGNOSIS — E785 Hyperlipidemia, unspecified: Secondary | ICD-10-CM

## 2020-07-12 ENCOUNTER — Encounter: Payer: Self-pay | Admitting: Family Medicine

## 2020-07-12 ENCOUNTER — Ambulatory Visit (INDEPENDENT_AMBULATORY_CARE_PROVIDER_SITE_OTHER): Payer: Federal, State, Local not specified - PPO | Admitting: Family Medicine

## 2020-07-12 ENCOUNTER — Other Ambulatory Visit: Payer: Self-pay

## 2020-07-12 VITALS — BP 108/70 | HR 69 | Temp 99.4°F | Resp 18 | Ht 74.0 in | Wt 206.2 lb

## 2020-07-12 DIAGNOSIS — Z0000 Encounter for general adult medical examination without abnormal findings: Secondary | ICD-10-CM

## 2020-07-12 DIAGNOSIS — I10 Essential (primary) hypertension: Secondary | ICD-10-CM | POA: Diagnosis not present

## 2020-07-12 DIAGNOSIS — Z800 Family history of malignant neoplasm of digestive organs: Secondary | ICD-10-CM | POA: Insufficient documentation

## 2020-07-12 DIAGNOSIS — C61 Malignant neoplasm of prostate: Secondary | ICD-10-CM | POA: Diagnosis not present

## 2020-07-12 DIAGNOSIS — E041 Nontoxic single thyroid nodule: Secondary | ICD-10-CM

## 2020-07-12 DIAGNOSIS — M1A0790 Idiopathic chronic gout, unspecified ankle and foot, without tophus (tophi): Secondary | ICD-10-CM | POA: Diagnosis not present

## 2020-07-12 DIAGNOSIS — E785 Hyperlipidemia, unspecified: Secondary | ICD-10-CM

## 2020-07-12 DIAGNOSIS — H35371 Puckering of macula, right eye: Secondary | ICD-10-CM

## 2020-07-12 NOTE — Assessment & Plan Note (Signed)
Per Dr Zadie Rhine

## 2020-07-12 NOTE — Patient Instructions (Signed)

## 2020-07-12 NOTE — Assessment & Plan Note (Signed)
ghm utd Check labs Labs reviewed

## 2020-07-12 NOTE — Progress Notes (Signed)
Subjective:   By signing my name below, I, Shehryar Baig, attest that this documentation has been prepared under the direction and in the presence of Dr. Roma Schanz, DO. 07/12/2020   Patient ID: [name], Dr., male    DOB: 1960/04/02, 60 y.o.   MRN: [id number]  Chief Complaint  Patient presents with   Annual Exam    HPI Patient is in today for a comprehensive physical exam. He complains of intermittent arthritis in his feet that comes on 1-2 times a year. His pain reduced after increasing his dosage of 150 mg allopurinol daily PO to 300 mg allopurinol daily PO. He continues to see his dermatologist to manage his skin changes. He had a retinal detachment due to a retinal disease that has caused him to go on disability at his work. He recently developed double vision as a result of having 6 procedures done on his eye. He has been having allergies recently. He is coughing and has congestion at this time. He denies having any fever, ear pain, sinus pain, sore throat, eye pain, chest pain, palpations, cough, SOB, wheezing, n/v/d, constipation, blood in stool, dysuria, frequency, hematuria, or headaches at this time. He continues seeing his urologist. He was diagnosed with prostate cancer last year.  His family had Covid-19 at home but he did not contract it while they were affected. His brother died of pancreatic cancer last year. He is requesting an a1c screening to monitor his pancreas. He has 2 Covid-19 vaccines and is not willing to get any more. He reports that his thyroid US had normal results. He participating in exercise by riding his bike outside. He eats red meat and 4 glasses of wine once weekly. He is due on the shingles vaccine but will wait until his eye treatments before he gets the vaccine.   He reports that his HDL dropped to 38. Lab Results  Component Value Date   CHOL 185 04/10/2020   HDL 38.10 (L) 04/10/2020   LDLCALC 110 (H) 04/10/2020   LDLDIRECT 136.7  06/09/2007   TRIG 183.0 (H) 04/10/2020   CHOLHDL 5 04/10/2020    Past Medical History:  Diagnosis Date   BPH (benign prostatic hypertrophy)    GERD (gastroesophageal reflux disease)    H/O hiatal hernia    History of colon polyps    Hyperlipidemia    Hypertension    Knee internal derangement    RIGHT   Mild asthma    Vitamin D deficiency    Wears glasses     Past Surgical History:  Procedure Laterality Date   COLONOSCOPY W/ POLYPECTOMY  01/21/2008   ESOPHAGOGASTRODUODENOSCOPY  11/15/2008   EYE SURGERY Right    x6   [facility]   KNEE ARTHROSCOPY Right X4  last one 2005   KNEE ARTHROSCOPY Right 12/06/2013   Procedure: ARTHROSCOPY RIGHT KNEE WITH DEBRIDMENT AND PARTIAL MEDIAL AND LATERAL MENISCECTOMY AND CHONDRLPLASTY;  Surgeon: [name], MD;  Location: [facility];  Service: Orthopedics;  Laterality: Right;   LUMBAR DISC SURGERY  04/02/2007   L5 -- S1   NEGATIVE SLEEP STUDY  per pt   PARATHYROIDECTOMY  02/04/1987   removal adenoma    Family History  Problem Relation Age of Onset   Hyperlipidemia Mother    Coronary artery disease Mother    Pulmonary embolism Father    Colon cancer Father    Hyperlipidemia Brother    Pancreatic cancer Brother     Social History   Socioeconomic History  Marital status: Married    Spouse name: jan   Number of children: 2   Years of education: Not on file   Highest education level: Not on file  Occupational History   Occupation: physician  Tobacco Use   Smoking status: Never   Smokeless tobacco: Never  Substance and Sexual Activity   Alcohol use: Yes    Alcohol/week: 0.0 standard drinks    Comment: social use   Drug use: No   Sexual activity: Yes    Partners: Female  Other Topics Concern   Not on file  Social History Narrative   Not on file   Social Determinants of Health   Financial Resource Strain: Not on file  Food Insecurity: Not on file  Transportation Needs: Not on file  Physical Activity:  Not on file  Stress: Not on file  Social Connections: Not on file  Intimate Partner Violence: Not on file    Outpatient Medications Prior to Visit  Medication Sig Dispense Refill   albuterol (VENTOLIN HFA) 108 (90 Base) MCG/ACT inhaler Inhale 2 puffs into the lungs every 6 (six) hours as needed for wheezing or shortness of breath. 1 Inhaler 0   ALBUTEROL IN Inhale into the lungs as needed.     Albuterol Sulfate 2.5 MG/0.5ML NEBU Inhale into the lungs. 1 nebule every 6 hours as needed     allopurinol (ZYLOPRIM) 300 MG tablet Take 1 tablet (300 mg total) by mouth daily. 90 tablet 3   atorvastatin (LIPITOR) 40 MG tablet TAKE 1 TABLET EVERY MORNING 90 tablet 1   BREO ELLIPTA 100-25 MCG/INH AEPB USE 1 INHALATION ORALLY    DAILY 180 each 3   Cholecalciferol (VITAMIN D) 50 MCG (2000 UT) tablet Take 2,000 Units by mouth daily.     Coenzyme Q10 (CO Q 10 PO) Take 1 tablet by mouth daily.     Multiple Vitamin (MULTIVITAMIN) tablet Take 1 tablet by mouth every morning.      nebivolol (BYSTOLIC) 10 MG tablet TAKE 1 TABLET DAILY 90 tablet 1   olmesartan-hydrochlorothiazide (BENICAR HCT) 40-12.5 MG tablet TAKE 1 TABLET DAILY 90 tablet 1   omeprazole (PRILOSEC) 40 MG capsule TAKE 1 CAPSULE DAILY 90 capsule 3   prednisoLONE acetate (PRED FORTE) 1 % ophthalmic suspension Place 1 drop into the right eye 4 (four) times daily. 10 mL 0   silodosin (RAPAFLO) 8 MG CAPS capsule Take 8 mg by mouth at bedtime.     solifenacin (VESICARE) 5 MG tablet Take 1 tablet (5 mg total) by mouth daily. 90 tablet 4   No facility-administered medications prior to visit.    Allergies  Allergen Reactions   Merthiolate [Thimerosal] Rash    Rash--local    Review of Systems  Constitutional:  Negative for fever.  HENT:  Positive for congestion. Negative for ear pain, sinus pain and sore throat.   Eyes:  Negative for pain.  Respiratory:  Positive for cough. Negative for shortness of breath and wheezing.   Cardiovascular:   Negative for chest pain and palpitations.  Gastrointestinal:  Negative for blood in stool, constipation, diarrhea, nausea and vomiting.  Genitourinary:  Negative for dysuria, frequency and hematuria.  Neurological:  Negative for dizziness and headaches.  Endo/Heme/Allergies:  Positive for environmental allergies.      Objective:    Physical Exam Constitutional:      General: He is not in acute distress.    Appearance: Normal appearance. He is not ill-appearing.  HENT:     Head: Normocephalic  and atraumatic.     Right Ear: Tympanic membrane and external ear normal.     Left Ear: Tympanic membrane and external ear normal.  Eyes:     Extraocular Movements: Extraocular movements intact.     Pupils: Pupils are equal, round, and reactive to light.  Neck:     Vascular: No carotid bruit.  Cardiovascular:     Rate and Rhythm: Normal rate and regular rhythm.     Pulses: Normal pulses.     Heart sounds: Normal heart sounds. No murmur heard.   No gallop.  Pulmonary:     Effort: Pulmonary effort is normal. No respiratory distress.     Breath sounds: Normal breath sounds. No wheezing, rhonchi or rales.  Skin:    General: Skin is warm and dry.  Neurological:     Mental Status: He is alert and oriented to person, place, and time.  Psychiatric:        Behavior: Behavior normal.    BP 108/70 (BP Location: Right Arm, Patient Position: Sitting, Cuff Size: Normal)   Pulse 69   Temp 99.4 F (37.4 C) (Oral)   Resp 18   Ht 6\' 2"  (1.88 m)   Wt 206 lb 3.2 oz (93.5 kg)   SpO2 96%   BMI 26.47 kg/m  Wt Readings from Last 3 Encounters:  07/12/20 206 lb 3.2 oz (93.5 kg)  04/08/19 203 lb 9.6 oz (92.4 kg)  06/24/18 195 lb (88.5 kg)    Diabetic Foot Exam - Simple   No data filed    Lab Results  Component Value Date   WBC 7.1 04/10/2020   HGB 14.0 04/10/2020   HCT 41.1 04/10/2020   PLT 288.0 04/10/2020   GLUCOSE 106 (H) 04/10/2020   CHOL 185 04/10/2020   TRIG 183.0 (H) 04/10/2020    HDL 38.10 (L) 04/10/2020   LDLDIRECT 136.7 06/09/2007   LDLCALC 110 (H) 04/10/2020   ALT 30 04/10/2020   AST 18 04/10/2020   NA 141 04/10/2020   K 3.9 04/10/2020   CL 104 04/10/2020   CREATININE 1.10 04/10/2020   BUN 19 04/10/2020   CO2 27 04/10/2020   TSH 1.51 04/10/2020   PSA 6.61 (H) 04/10/2020   INR 1.0 12/09/2006   HGBA1C  12/09/2006    5.6 (NOTE)   The ADA recommends the following therapeutic goals for glycemic   control related to Hgb A1C measurement:   Goal of Therapy:   < 7.0% Hgb A1C   Action Suggested:  > 8.0% Hgb A1C   Ref:  Diabetes Care, 22, Suppl. 1, 1999    Lab Results  Component Value Date   TSH 1.51 04/10/2020   Lab Results  Component Value Date   WBC 7.1 04/10/2020   HGB 14.0 04/10/2020   HCT 41.1 04/10/2020   MCV 85.3 04/10/2020   PLT 288.0 04/10/2020   Lab Results  Component Value Date   NA 141 04/10/2020   K 3.9 04/10/2020   CO2 27 04/10/2020   GLUCOSE 106 (H) 04/10/2020   BUN 19 04/10/2020   CREATININE 1.10 04/10/2020   BILITOT 0.4 04/10/2020   ALKPHOS 82 04/10/2020   AST 18 04/10/2020   ALT 30 04/10/2020   PROT 6.7 04/10/2020   ALBUMIN 4.0 04/10/2020   CALCIUM 9.6 04/10/2020   GFR 72.40 04/10/2020   Lab Results  Component Value Date   CHOL 185 04/10/2020   Lab Results  Component Value Date   HDL 38.10 (L) 04/10/2020   Lab Results  Component Value Date   LDLCALC 110 (H) 04/10/2020   Lab Results  Component Value Date   TRIG 183.0 (H) 04/10/2020   Lab Results  Component Value Date   CHOLHDL 5 04/10/2020   Lab Results  Component Value Date   HGBA1C  12/09/2006    5.6 (NOTE)   The ADA recommends the following therapeutic goals for glycemic   control related to Hgb A1C measurement:   Goal of Therapy:   < 7.0% Hgb A1C   Action Suggested:  > 8.0% Hgb A1C   Ref:  Diabetes Care, 22, Suppl. 1, 1999    Colonoscopy- Last completed 07/26/2014. Results showed diverticulosis in the sigmoid colon, anal papillae were hypertrophied, one  diminutive polyp in the distal transverse colon, otherwise results are normal. Repeat in 5 years. He has a upcoming colonoscopy appointment in August. PSA- Last completed 04/10/2020. Results showed elevated PSA.  Dexa- Not yet completed.     Assessment & Plan:   Problem List Items Addressed This Visit       Unprioritized   Thyroid nodule   Essential hypertension    Well controlled, no changes to meds. Encouraged heart healthy diet such as the DASH diet and exercise as tolerated.        Family history of pancreatic cancer   Hyperlipidemia    Encouraged heart healthy diet, increase exercise, avoid trans fats, consider a krill oil cap daily Pt to start exercising again        Macular pucker, right eye    Per Dr Zadie Rhine       Preventative health care - Primary    ghm utd Check labs Labs reviewed        Prostate cancer [facility])    Per urology       Other Visit Diagnoses     Chronic gout of foot, unspecified cause, unspecified laterality       Relevant Orders   Hemoglobin A1c   Uric acid        No orders of the defined types were placed in this encounter.   I, Dr. Roma Schanz, DO, personally preformed the services described in this documentation.  All medical record entries made by the scribe were at my direction and in my presence.  I have reviewed the chart and discharge instructions (if applicable) and agree that the record reflects my personal performance and is accurate and complete. 07/12/2020   I,Shehryar Baig,acting as a scribe for Ann Held, DO.,have documented all relevant documentation on the behalf of Ann Held, DO,as directed by  Ann Held, DO while in the presence of Ann Held, DO.   Ann Held, DO

## 2020-07-12 NOTE — Assessment & Plan Note (Signed)
Per u rology 

## 2020-07-12 NOTE — Assessment & Plan Note (Signed)
Encouraged heart healthy diet, increase exercise, avoid trans fats, consider a krill oil cap daily Pt to start exercising again

## 2020-07-12 NOTE — Assessment & Plan Note (Signed)
Well controlled, no changes to meds. Encouraged heart healthy diet such as the DASH diet and exercise as tolerated.  °

## 2020-07-13 LAB — HEMOGLOBIN A1C: Hgb A1c MFr Bld: 6.1 % (ref 4.6–6.5)

## 2020-07-13 LAB — URIC ACID: Uric Acid, Serum: 5.5 mg/dL (ref 4.0–7.8)

## 2020-08-03 DIAGNOSIS — Z8616 Personal history of COVID-19: Secondary | ICD-10-CM

## 2020-08-03 HISTORY — DX: Personal history of COVID-19: Z86.16

## 2020-08-14 ENCOUNTER — Telehealth (INDEPENDENT_AMBULATORY_CARE_PROVIDER_SITE_OTHER): Payer: Federal, State, Local not specified - PPO | Admitting: Family Medicine

## 2020-08-14 ENCOUNTER — Encounter: Payer: Self-pay | Admitting: Family Medicine

## 2020-08-14 ENCOUNTER — Other Ambulatory Visit: Payer: Self-pay | Admitting: Family Medicine

## 2020-08-14 DIAGNOSIS — J4520 Mild intermittent asthma, uncomplicated: Secondary | ICD-10-CM | POA: Diagnosis not present

## 2020-08-14 DIAGNOSIS — U071 COVID-19: Secondary | ICD-10-CM | POA: Diagnosis not present

## 2020-08-14 MED ORDER — NIRMATRELVIR/RITONAVIR (PAXLOVID)TABLET: 3.0000 | ORAL_TABLET | Freq: Two times a day (BID) | ORAL | 0 refills | Status: AC

## 2020-08-14 MED ORDER — ALBUTEROL SULFATE HFA 108 (90 BASE) MCG/ACT IN AERS: 2.0000 | INHALATION_SPRAY | Freq: Four times a day (QID) | RESPIRATORY_TRACT | 0 refills | Status: DC | PRN

## 2020-08-14 NOTE — Patient Instructions (Addendum)
HOME CARE TIPS:  -I sent the medication(s) we discussed to your pharmacy: Meds ordered this encounter  Medications   nirmatrelvir/ritonavir EUA (PAXLOVID) TABS    Sig: Take 3 tablets by mouth 2 (two) times daily for 5 days. (Take nirmatrelvir 150 mg two tablets twice daily for 5 days and ritonavir 100 mg one tablet twice daily for 5 days) Patient GFR is 72    Dispense:  30 tablet    Refill:  0   albuterol (VENTOLIN HFA) 108 (90 Base) MCG/ACT inhaler    Sig: Inhale 2 puffs into the lungs every 6 (six) hours as needed for wheezing or shortness of breath.    Dispense:  1 each    Refill:  0    -please hold your cholesterol medication (lipitor), Rapaflo and Vesicare while taking the Paxlovid. Please review all of your medications with the pharmacist as well for any further recommendations before taking the Paxlovid.   -Please see the information provided below   -stay hydrated, drink plenty of fluids and eat small healthy meals - avoid dairy  -check out the [facility] website for more information on home care, transmission and treatment for COVID19  -follow up with your doctor in 2-3 days unless improving and feeling better  -stay home while sick, except to seek medical care. If you have COVID19, ideally it would be best to stay home for a full 10 days since the onset of symptoms PLUS one day of no fever and feeling better. Wear a good mask that fits snugly (such as N95 or KN95) if around others to reduce the risk of transmission.  It was nice to meet you today, and I really hope you are feeling better soon. I help [facility] out with telemedicine visits on Tuesdays and Thursdays and am available for visits on those days. If you have any concerns or questions following this visit please schedule a follow up visit with your Primary Care doctor or seek care at a local urgent care clinic to avoid delays in care.    Seek in person care or schedule a follow up video visit promptly if your symptoms  worsen, new concerns arise or you are not improving with treatment. Call 911 and/or seek emergency care if your symptoms are severe or life threatening.  FACT SHEET FOR PATIENTS, PARENTS, AND CAREGIVERS EMERGENCY USE AUTHORIZATION (EUA) OF PAXLOVID FOR CORONAVIRUS DISEASE 2019 (COVID-19) You are being given this Fact Sheet because your healthcare provider believes it is necessary to provide you with PAXLOVID for the treatment of mild-to-moderate coronavirus disease (COVID-19) caused by the SARS-CoV-2 virus. This Fact Sheet contains information to help you understand the risks and benefits of taking the PAXLOVID you have received or may receive. The U.S. Food and Drug Administration (FDA) has issued an Emergency Use Authorization (EUA) to make PAXLOVID available during the COVID-19 pandemic (for more details about an EUA please see "What is an Emergency Use Authorization?" at the end of this document). PAXLOVID is not an FDA-approved medicine in the [place]. Read this Fact Sheet for information about PAXLOVID. Talk to your healthcare provider about your options or if you have any questions. It is your choice to take PAXLOVID.  What is COVID-19? COVID-19 is caused by a virus called a coronavirus. You can get COVID-19 through close contact with another person who has the virus. COVID-19 illnesses have ranged from very mild-to-severe, including illness resulting in death. While information so far suggests that most COVID-19 illness is mild, serious illness  can happen and may cause some of your other medical conditions to become worse. Older people and people of all ages with severe, long lasting (chronic) medical conditions like heart disease, lung disease, and diabetes, for example seem to be at higher risk of being hospitalized for COVID-19.  What is PAXLOVID? PAXLOVID is an investigational medicine used to treat mild-to-moderate COVID-19 in adults and children [20 years of age and  older weighing at least 10 pounds (62 kg)] with positive results of direct SARS-CoV-2 viral testing, and who are at high risk for progression to severe COVID-19, including hospitalization or death. PAXLOVID is investigational because it is still being studied. There is limited information about the safety and effectiveness of using PAXLOVID to treat people with mild-to-moderate COVID-19.  The FDA has authorized the emergency use of PAXLOVID for the treatment of mild-tomoderate COVID-19 in adults and children [36 years of age and older weighing at least 36 pounds (82 kg)] with a positive test for the virus that causes COVID-19, and who are at high risk for progression to severe COVID-19, including hospitalization or death, under an EUA. 1 Revised: 20 April 2020   What should I tell my healthcare provider before I take PAXLOVID? Tell your healthcare provider if you: ? Have any allergies ? Have liver or kidney disease ? Are pregnant or plan to become pregnant ? Are breastfeeding a child ? Have any serious illnesses  Tell your healthcare provider about all the medicines you take, including prescription and over-the-counter medicines, vitamins, and herbal supplements. Some medicines may interact with PAXLOVID and may cause serious side effects. Keep a list of your medicines to show your healthcare provider and pharmacist when you get a new medicine.  You can ask your healthcare provider or pharmacist for a list of medicines that interact with PAXLOVID. Do not start taking a new medicine without telling your healthcare provider. Your healthcare provider can tell you if it is safe to take PAXLOVID with other medicines.  Tell your healthcare provider if you are taking combined hormonal contraceptive. PAXLOVID may affect how your birth control pills work. Females who are able to become pregnant should use another effective alternative form of contraception or an additional barrier method of  contraception. Talk to your healthcare provider if you have any questions about contraceptive methods that might be right for you.  How do I take PAXLOVID? ? PAXLOVID consists of 2 medicines: nirmatrelvir and ritonavir. o Take 2 pink tablets of nirmatrelvir with 1 white tablet of ritonavir by mouth 2 times each day (in the morning and in the evening) for 5 days. For each dose, take all 3 tablets at the same time. o If you have kidney disease, talk to your healthcare provider. You may need a different dose. ? Swallow the tablets whole. Do not chew, break, or crush the tablets. ? Take PAXLOVID with or without food. ? Do not stop taking PAXLOVID without talking to your healthcare provider, even if you feel better. ? If you miss a dose of PAXLOVID within 8 hours of the time it is usually taken, take it as soon as you remember. If you miss a dose by more than 8 hours, skip the missed dose and take the next dose at your regular time. Do not take 2 doses of PAXLOVID at the same time. ? If you take too much PAXLOVID, call your healthcare provider or go to the nearest hospital emergency room right away. ? If you are taking a ritonavir- or  cobicistat-containing medicine to treat hepatitis C or Human Immunodeficiency Virus (HIV), you should continue to take your medicine as prescribed by your healthcare provider. 2 Revised: 20 April 2020    Talk to your healthcare provider if you do not feel better or if you feel worse after 5 days.  Who should generally not take PAXLOVID? Do not take PAXLOVID if: ? You are allergic to nirmatrelvir, ritonavir, or any of the ingredients in PAXLOVID. ? You are taking any of the following medicines: o Alfuzosin o Pethidine, propoxyphene o Ranolazine o Amiodarone, dronedarone, flecainide, propafenone, quinidine o Colchicine o Lurasidone, pimozide, clozapine o Dihydroergotamine, ergotamine, methylergonovine o Lovastatin, simvastatin o Sildenafil (Revatio)  for pulmonary arterial hypertension (PAH) o Triazolam, oral midazolam o Apalutamide o Carbamazepine, phenobarbital, phenytoin o Rifampin o St. John's Wort (hypericum perforatum) Taking PAXLOVID with these medicines may cause serious or life-threatening side effects or affect how PAXLOVID works.  These are not the only medicines that may cause serious side effects if taken with PAXLOVID. PAXLOVID may increase or decrease the levels of multiple other medicines. It is very important to tell your healthcare provider about all of the medicines you are taking because additional laboratory tests or changes in the dose of your other medicines may be necessary while you are taking PAXLOVID. Your healthcare provider may also tell you about specific symptoms to watch out for that may indicate that you need to stop or decrease the dose of some of your other medicines.  What are the important possible side effects of PAXLOVID? Possible side effects of PAXLOVID are: ? Allergic Reactions. Allergic reactions can happen in people taking PAXLOVID, even after only 1 dose. Stop taking PAXLOVID and call your healthcare provider right away if you get any of the following symptoms of an allergic reaction: o hives o trouble swallowing or breathing o swelling of the mouth, lips, or face o throat tightness o hoarseness 3 Revised: 20 April 2020  o skin rash ? Liver Problems. Tell your healthcare provider right away if you have any of these signs and symptoms of liver problems: loss of appetite, yellowing of your skin and the whites of eyes (jaundice), dark-colored urine, pale colored stools and itchy skin, stomach area (abdominal) pain. ? Resistance to HIV Medicines. If you have untreated HIV infection, PAXLOVID may lead to some HIV medicines not working as well in the future. ? Other possible side effects include: o altered sense of taste o diarrhea o high blood pressure o muscle aches These are not  all the possible side effects of PAXLOVID. Not many people have taken PAXLOVID. Serious and unexpected side effects may happen. PAXLOVID is still being studied, so it is possible that all of the risks are not known at this time.  What other treatment choices are there? Veklury (remdesivir) is FDA-approved for the treatment of mild-to-moderate IFOYD-74 in certain adults and children. Talk with your doctor to see if Marijean Heath is appropriate for you. Like PAXLOVID, FDA may also allow for the emergency use of other medicines to treat people with COVID-19. Go to https://price.info/ for information on the emergency use of other medicines that are authorized by FDA to treat people with COVID-19. Your healthcare provider may talk with you about clinical trials for which you may be eligible. It is your choice to be treated or not to be treated with PAXLOVID. Should you decide not to receive it or for your child not to receive it, it will not change your standard medical care.  What if I am pregnant or breastfeeding? There is no experience treating pregnant women or breastfeeding mothers with PAXLOVID. For a mother and unborn baby, the benefit of taking PAXLOVID may be greater than the risk from the treatment. If you are pregnant, discuss your options and specific situation with your healthcare provider. It is recommended that you use effective barrier contraception or do not have sexual activity while taking PAXLOVID. If you are breastfeeding, discuss your options and specific situation with your healthcare provider. 4 Revised: 20 April 2020   How do I report side effects with PAXLOVID? Contact your healthcare provider if you have any side effects that bother you or do not go away. Report side effects to FDA MedWatch at SmoothHits.hu or call 1-800-FDA1088 or you can report side effects  to [employer]. at the contact information provided below. Website Fax number Telephone number www.pfizersafetyreporting.com [phone] How should I store [place]? Store PAXLOVID tablets at room temperature, between 68?F to 77?F (20?C to 25?C). How can I learn more about COVID-19? ? Ask your healthcare provider. ? Visit https://jacobson-johnson.com/. ? Contact your local or state public health department. What is an Emergency Use Authorization (EUA)? The [place] FDA has made PAXLOVID available under an emergency access mechanism called an Emergency Use Authorization (EUA). The EUA is supported by a Education officer, museum and Human Service (HHS) declaration that circumstances exist to justify the emergency use of drugs and biological products during the COVID-19 pandemic. PAXLOVID for the treatment of mild-to-moderate COVID-19 in adults and children [6 years of age and older weighing at least 10 pounds (29 kg)] with positive results of direct SARS-CoV-2 viral testing, and who are at high risk for progression to severe COVID-19, including hospitalization or death, has not undergone the same type of review as an FDA-approved product. In issuing an EUA under the JASNK-53 public health emergency, the FDA has determined, among other things, that based on the total amount of scientific evidence available including data from adequate and well-controlled clinical trials, if available, it is reasonable to believe that the product may be effective for diagnosing, treating, or preventing COVID-19, or a serious or life-threatening disease or condition caused by COVID-19; that the known and potential benefits of the product, when used to diagnose, treat, or prevent such disease or condition, outweigh the known and potential risks of such product; and that there are no adequate, approved, and available alternatives. All of these criteria must be met to allow for the product to be  used in the treatment of patients during the COVID-19 pandemic. The EUA for PAXLOVID is in effect for the duration of the COVID-19 declaration justifying emergency use of this product, unless terminated or revoked (after which the products may no longer be used under the EUA). 5 Revised: 20 April 2020     Additional Information For general questions, visit the website or call the telephone number provided below. Website Telephone number www.COVID19oralRx.com [phone] (1-877-C19-PACK) You can also go to www.pfizermedinfo.com or call [phone] for more information. [id number]-[id number] Revised: 20 April 2020

## 2020-08-14 NOTE — Progress Notes (Signed)
Virtual Visit via Video Note  I connected with [name]  on 08/14/20 at  5:00 PM EDT by a video enabled telemedicine application and verified that I am speaking with the correct person using two identifiers.  Location patient: home, [place] Location provider:work or home office Persons participating in the virtual visit: patient, provider  I discussed the limitations of evaluation and management by telemedicine and the availability of in person appointments. The patient expressed understanding and agreed to proceed.   HPI:  Acute telemedicine visit for Covid19: -Onset:yesterday -Symptoms include: scratchy throat, sore throat, cough, fever, body aches -Denies:CP, SOB, NVD, inability to eat/drink/get out of bed -Pertinent past medical history: HTN, hld, mild asthma and see below -Pertinent medication allergies:  Allergies  Allergen Reactions   Merthiolate [Thimerosal] Rash    Rash--local  -COVID-19 vaccine status: 2 doses GFR 72 on labs in March  ROS: See pertinent positives and negatives per HPI.  Past Medical History:  Diagnosis Date   BPH (benign prostatic hypertrophy)    GERD (gastroesophageal reflux disease)    H/O hiatal hernia    History of colon polyps    Hyperlipidemia    Hypertension    Knee internal derangement    RIGHT   Mild asthma    Vitamin D deficiency    Wears glasses     Past Surgical History:  Procedure Laterality Date   COLONOSCOPY W/ POLYPECTOMY  01/21/2008   ESOPHAGOGASTRODUODENOSCOPY  11/15/2008   EYE SURGERY Right    x6   [facility]   KNEE ARTHROSCOPY Right X4  last one 2005   KNEE ARTHROSCOPY Right 12/06/2013   Procedure: ARTHROSCOPY RIGHT KNEE WITH DEBRIDMENT AND PARTIAL MEDIAL AND LATERAL MENISCECTOMY AND CHONDRLPLASTY;  Surgeon: [name], MD;  Location: [facility];  Service: Orthopedics;  Laterality: Right;   LUMBAR DISC SURGERY  04/02/2007   L5 -- S1   NEGATIVE SLEEP STUDY  per pt   PARATHYROIDECTOMY  02/04/1987   removal  adenoma     Current Outpatient Medications:    ALBUTEROL IN, Inhale into the lungs as needed., Disp: , Rfl:    Albuterol Sulfate 2.5 MG/0.5ML NEBU, Inhale into the lungs. 1 nebule every 6 hours as needed, Disp: , Rfl:    allopurinol (ZYLOPRIM) 300 MG tablet, Take 1 tablet (300 mg total) by mouth daily., Disp: 90 tablet, Rfl: 3   atorvastatin (LIPITOR) 40 MG tablet, TAKE 1 TABLET EVERY MORNING, Disp: 90 tablet, Rfl: 1   BREO ELLIPTA 100-25 MCG/INH AEPB, USE 1 INHALATION ORALLY    DAILY, Disp: 180 each, Rfl: 3   Coenzyme Q10 (CO Q 10 PO), Take 1 tablet by mouth daily., Disp: , Rfl:    nebivolol (BYSTOLIC) 10 MG tablet, TAKE 1 TABLET DAILY, Disp: 90 tablet, Rfl: 1   nirmatrelvir/ritonavir EUA (PAXLOVID) TABS, Take 3 tablets by mouth 2 (two) times daily for 5 days. (Take nirmatrelvir 150 mg two tablets twice daily for 5 days and ritonavir 100 mg one tablet twice daily for 5 days) Patient GFR is 72, Disp: 30 tablet, Rfl: 0   olmesartan-hydrochlorothiazide (BENICAR HCT) 40-12.5 MG tablet, TAKE 1 TABLET DAILY, Disp: 90 tablet, Rfl: 1   omeprazole (PRILOSEC) 40 MG capsule, TAKE 1 CAPSULE DAILY (Patient taking differently: as needed.), Disp: 90 capsule, Rfl: 3   silodosin (RAPAFLO) 8 MG CAPS capsule, Take 8 mg by mouth at bedtime., Disp: , Rfl:    solifenacin (VESICARE) 5 MG tablet, Take 1 tablet (5 mg total) by mouth daily., Disp: 90 tablet,  Rfl: 4   VITAMIN D PO, Take 4,000 Lipoprotein Lipase Releasing Units by mouth daily., Disp: , Rfl:    albuterol (VENTOLIN HFA) 108 (90 Base) MCG/ACT inhaler, Inhale 2 puffs into the lungs every 6 (six) hours as needed for wheezing or shortness of breath., Disp: 1 each, Rfl: 0  EXAM:  VITALS per patient if applicable:  GENERAL: alert, oriented, appears well and in no acute distress  HEENT: atraumatic, conjunttiva clear, no obvious abnormalities on inspection of external nose and ears  NECK: normal movements of the head and neck  LUNGS: on inspection no signs  of respiratory distress, breathing rate appears normal, no obvious gross SOB, gasping or wheezing  CV: no obvious cyanosis  MS: moves all visible extremities without noticeable abnormality  PSYCH/NEURO: pleasant and cooperative, no obvious depression or anxiety, speech and thought processing grossly intact  ASSESSMENT AND PLAN:  Discussed the following assessment and plan:  COVID-19  Mild intermittent asthma, unspecified whether complicated - Plan: albuterol (VENTOLIN HFA) 108 (90 Base) MCG/ACT inhaler   Discussed treatment options isolation and precautions for COVID-19.  After lengthy discussion, the patient opted for treatment with Paxlovid due to being higher risk for complications of covid or severe disease and other factors. 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 Did interaction check through [facility]. Several of his drugs are not in the [employer] and advised and he opted to hold during paxlovid use as they are metabolized in the liver (rapaflo and vsicare) to avoid potential interactions. He also agreed will hold his statin. Discussed EUA status of this drug and the fact that there is preliminary limited knowledge of risks/interactions/side effects per EUA document vs possible benefits and precautions. This information was shared with patient during the visit and also was provided in patient instructions. He also requested a refill on his albuterol - provided.  Other symptomatic care measures summarized in patient instructions. Work/School slipped offered: declined Advised to seek prompt in person care if worsening, new symptoms arise, or if is not improving with treatment.    I discussed the assessment and treatment plan with the patient. The patient was provided an opportunity to ask questions and all were answered. The patient agreed with the plan and demonstrated an understanding of the instructions.     Lucretia Kern, DO

## 2020-08-22 ENCOUNTER — Other Ambulatory Visit: Payer: Self-pay | Admitting: Family Medicine

## 2020-08-22 ENCOUNTER — Encounter: Payer: Self-pay | Admitting: Family Medicine

## 2020-08-22 DIAGNOSIS — U071 COVID-19: Secondary | ICD-10-CM

## 2020-08-22 DIAGNOSIS — J4520 Mild intermittent asthma, uncomplicated: Secondary | ICD-10-CM

## 2020-08-22 MED ORDER — PREDNISONE 10 MG PO TABS: ORAL_TABLET | ORAL | 0 refills | Status: DC

## 2020-08-28 ENCOUNTER — Other Ambulatory Visit: Payer: Self-pay

## 2020-08-28 DIAGNOSIS — M1A0790 Idiopathic chronic gout, unspecified ankle and foot, without tophus (tophi): Secondary | ICD-10-CM

## 2020-08-28 MED ORDER — ALLOPURINOL 300 MG PO TABS
300.0000 mg | ORAL_TABLET | Freq: Every day | ORAL | 3 refills | Status: DC
Start: 2020-08-28 — End: 2021-07-10

## 2020-09-06 ENCOUNTER — Ambulatory Visit (INDEPENDENT_AMBULATORY_CARE_PROVIDER_SITE_OTHER): Payer: Federal, State, Local not specified - PPO | Admitting: Ophthalmology

## 2020-09-06 ENCOUNTER — Other Ambulatory Visit: Payer: Self-pay

## 2020-09-06 ENCOUNTER — Encounter (INDEPENDENT_AMBULATORY_CARE_PROVIDER_SITE_OTHER): Payer: Self-pay | Admitting: Ophthalmology

## 2020-09-06 DIAGNOSIS — H35351 Cystoid macular degeneration, right eye: Secondary | ICD-10-CM | POA: Diagnosis not present

## 2020-09-06 DIAGNOSIS — H33011 Retinal detachment with single break, right eye: Secondary | ICD-10-CM

## 2020-09-06 DIAGNOSIS — H35371 Puckering of macula, right eye: Secondary | ICD-10-CM

## 2020-09-06 DIAGNOSIS — H532 Diplopia: Secondary | ICD-10-CM

## 2020-09-06 NOTE — Assessment & Plan Note (Signed)
Binocular distortion continues.

## 2020-09-06 NOTE — Assessment & Plan Note (Signed)
No signs of recurrence 

## 2020-09-06 NOTE — Assessment & Plan Note (Signed)
CME and retinal thickening the macula has improved and is probably reach maximal capability and provement

## 2020-09-06 NOTE — Progress Notes (Signed)
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      Referring physician: [name], Yvonne R, DO [address] [place],  [place] [address]  HISTORICAL INFORMATION:   Selected notes from the MEDICAL RECORD NUMBER    Lab Results  Component Value Date   HGBA1C 6.1 07/12/2020     CURRENT MEDICATIONS: No current outpatient medications on file. (Ophthalmic Drugs)   No current facility-administered medications for this visit. (Ophthalmic Drugs)   Current Outpatient Medications (Other)  Medication Sig   albuterol (VENTOLIN HFA) 108 (90 Base) MCG/ACT inhaler INHALE 2 PUFFS INTO THE LUNGS EVERY 6 HOURS AS NEEDED FOR WHEEZING OR SHORTNESS OF BREATH   ALBUTEROL IN Inhale into the lungs as needed.   Albuterol Sulfate 2.5 MG/0.5ML NEBU Inhale into the lungs. 1 nebule every 6 hours as needed   allopurinol (ZYLOPRIM) 300 MG tablet Take 1 tablet (300 mg total) by mouth daily.   atorvastatin (LIPITOR) 40 MG tablet TAKE 1 TABLET EVERY MORNING   Coenzyme Q10 (CO Q 10 PO) Take 1 tablet by mouth daily.   fluticasone furoate-vilanterol (BREO ELLIPTA) 100-25 MCG/INH AEPB USE 1 INHALATION ORALLY    DAILY    nebivolol (BYSTOLIC) 10 MG tablet TAKE 1 TABLET DAILY   olmesartan-hydrochlorothiazide (BENICAR HCT) 40-12.5 MG tablet TAKE 1 TABLET DAILY   omeprazole (PRILOSEC) 40 MG capsule TAKE 1 CAPSULE DAILY (Patient taking differently: as needed.)   predniSONE (DELTASONE) 10 MG tablet TAKE 3 TABLETS PO QD FOR 3 DAYS THEN TAKE 2 TABLETS PO QD FOR 3 DAYS THEN TAKE 1 TABLET PO QD FOR 3 DAYS THEN TAKE 1/2 TAB PO QD FOR 3 DAYS   silodosin (RAPAFLO) 8 MG CAPS capsule Take 8 mg by mouth at bedtime.   solifenacin (VESICARE) 5 MG tablet Take 1 tablet (5 mg total) by mouth daily.   VITAMIN D PO Take 4,000 Lipoprotein Lipase Releasing Units by mouth daily.   No current facility-administered medications for this visit. (Other)      REVIEW OF SYSTEMS:    ALLERGIES Allergies  Allergen Reactions   Merthiolate [Thimerosal] Rash    Rash--local    PAST MEDICAL HISTORY Past Medical History:  Diagnosis Date   BPH (benign prostatic hypertrophy)    GERD (gastroesophageal reflux disease)    H/O hiatal hernia    History of colon polyps    Hyperlipidemia    Hypertension    Knee internal  derangement    RIGHT   Macular pucker, right eye 07/05/2019   Vitrectomy, removal of silicone oil, membrane peel, repair complex retinal detachment inferonasal right eye  11-30-19   Mild asthma    Vitamin D deficiency    Wears glasses    Past Surgical History:  Procedure Laterality Date   COLONOSCOPY W/ POLYPECTOMY  01/21/2008   ESOPHAGOGASTRODUODENOSCOPY  11/15/2008   EYE SURGERY Right    x6   [name]   KNEE ARTHROSCOPY Right X4  last one 2005   KNEE ARTHROSCOPY Right 12/06/2013   Procedure: ARTHROSCOPY RIGHT KNEE WITH DEBRIDMENT AND PARTIAL MEDIAL AND LATERAL MENISCECTOMY AND CHONDRLPLASTY;  Surgeon: [name], MD;  Location: [facility];  Service: Orthopedics;  Laterality: Right;   LUMBAR DISC SURGERY  04/02/2007   L5 -- S1   NEGATIVE SLEEP STUDY  per pt   PARATHYROIDECTOMY  02/04/1987    removal adenoma    FAMILY HISTORY Family History  Problem Relation Age of Onset   Hyperlipidemia Mother    Coronary artery disease Mother    Pulmonary embolism Father    Colon cancer Father    Hyperlipidemia Brother    Pancreatic cancer Brother     SOCIAL HISTORY Social History   Tobacco Use   Smoking status: Never   Smokeless tobacco: Never  Substance Use Topics   Alcohol use: Yes    Alcohol/week: 0.0 standard drinks    Comment: social use   Drug use: No         OPHTHALMIC EXAM:  Base Eye Exam     Visual Acuity (ETDRS)       Right Left   Dist [place] 20/100 20/25   Dist ph [place] 20/60 -2     Correction: Glasses         Tonometry (Tonopen, 3:58 PM)       Right Left   Pressure 15 15         Pupils       Dark Light Shape React APD   Right 4 3 Round Slow None   Left 4 3 Round Slow None         Visual Fields       Left Right   Restrictions  Total superior nasal deficiency         Neuro/Psych     Oriented x3: Yes   Mood/Affect: Normal         Dilation     Right eye: 1.0% Mydriacyl, 2.5% Phenylephrine @ 3:58 PM           Slit Lamp and Fundus Exam     External Exam       Right Left   External Normal Normal         Slit Lamp Exam       Right Left   Lids/Lashes Normal Normal   Conjunctiva/Sclera White and quiet White and quiet   Cornea Clear Clear   Anterior Chamber Deep and quiet Deep and quiet   Iris Round and reactive Round and reactive   Lens Posterior chamber intraocular lens, 1+ Posterior capsular opacification 1+ Nuclear sclerosis   Anterior Vitreous Normal Normal         Fundus Exam       Right Left   Posterior Vitreous Clear, vitrectomized,  Normal   Disc Normal, pink Normal   C/D Ratio 0.25 0.2   Macula No topographic distortion, attached, no vertical contraction remains of the arcades temporally Normal   Vessels Normal  Normal   Periphery Former inferonasal retinal detachment, well demarcated and treated  in the bed of detachment post retinotomy, retinectomy inferonasal.  Attached, with a great view in the upright position  position with 25 diopter lens 360 scleral depressed exam, 25D lens, no breaks            IMAGING AND PROCEDURES  Imaging and Procedures for 09/06/20  OCT, Retina - OU - Both Eyes       Right Eye Quality was good. Scan locations included subfoveal. Central Foveal Thickness: 247. Progression has improved. Findings include abnormal foveal contour, outer retinal atrophy, central retinal atrophy.   Left Eye Quality was good. Scan locations included subfoveal. Central Foveal Thickness: 293. Progression has been stable. Findings include normal foveal contour.   Notes Much less topographic distortion, some foveal atrophy exist centrally OD no epiretinal membrane in the macular region for well two disc diameters radius   No epiretinal tissue preproliferation.  Outer retinal photoreceptor layer irregular in the foveal region yet still recognizable OS normal contour     Color Fundus Photography Optos - OU - Both Eyes       Right Eye Progression has been stable. Disc findings include normal observations.   Left Eye Progression has been stable. Disc findings include normal observations.   Notes Clear media, no topographic distortion, good retinopexy, OD  Normal left eye             ASSESSMENT/PLAN:  Binocular vision disorder with diplopia Binocular distortion continues.  Cystoid macular degeneration of right eye CME and retinal thickening the macula has improved and is probably reach maximal capability and provement  Retinal detachment of right eye with single break No signs of recurrence     ICD-10-CM   1. Macular pucker, right eye  H35.371 OCT, Retina - OU - Both Eyes    Color Fundus Photography Optos - OU - Both Eyes    2. Cystoid macular degeneration of right eye  H35.351     3. Binocular vision disorder with diplopia  H53.2     4.  Retinal detachment of right eye with single break  H33.011       1.  2.  3.  Ophthalmic Meds Ordered this visit:  No orders of the defined types were placed in this encounter.      No follow-ups on file.  There are no Patient Instructions on file for this visit.   Explained the diagnoses, plan, and follow up with the patient and they expressed understanding.  Patient expressed understanding of the importance of proper follow up care.   Clent Demark Miyako Oelke M.D. Diseases & Surgery of the Retina and Vitreous Retina & Diabetic [place] 09/06/20     Abbreviations: M myopia (nearsighted); A astigmatism; H hyperopia (farsighted); P presbyopia; Mrx spectacle prescription;  CTL contact lenses; OD right eye; OS left eye; OU both eyes  XT exotropia; ET esotropia; PEK punctate epithelial keratitis; PEE punctate epithelial erosions; DES dry eye syndrome; MGD meibomian gland dysfunction; ATs artificial tears; PFAT's preservative free artificial tears; [place] nuclear sclerotic cataract; PSC posterior subcapsular cataract; ERM epi-retinal membrane; PVD posterior vitreous detachment; RD retinal detachment; DM diabetes mellitus; DR diabetic retinopathy; NPDR non-proliferative diabetic retinopathy; PDR proliferative diabetic retinopathy; CSME clinically significant macular edema; DME diabetic macular edema; dbh dot blot hemorrhages; CWS cotton wool spot; POAG primary open angle glaucoma; C/D cup-to-disc ratio; HVF humphrey visual field; GVF goldmann visual field; OCT optical coherence tomography; IOP intraocular pressure; BRVO Branch retinal  vein occlusion; CRVO central retinal vein occlusion; CRAO central retinal artery occlusion; BRAO branch retinal artery occlusion; RT retinal tear; SB scleral buckle; PPV pars plana vitrectomy; VH Vitreous hemorrhage; PRP panretinal laser photocoagulation; IVK intravitreal kenalog; VMT vitreomacular traction; MH Macular hole;  NVD neovascularization of the disc; NVE  neovascularization elsewhere; AREDS age related eye disease study; ARMD age related macular degeneration; POAG primary open angle glaucoma; EBMD epithelial/anterior basement membrane dystrophy; ACIOL anterior chamber intraocular lens; IOL intraocular lens; PCIOL posterior chamber intraocular lens; Phaco/IOL phacoemulsification with intraocular lens placement; PRK photorefractive keratectomy; LASIK laser assisted in situ keratomileusis; HTN hypertension; DM diabetes mellitus; COPD chronic obstructive pulmonary disease    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      Referring physician: [name], Yvonne R, DO [address] [place],  [place] [address]  HISTORICAL INFORMATION:   Selected notes from the MEDICAL RECORD NUMBER    Lab Results  Component Value Date   HGBA1C 6.1 07/12/2020     CURRENT MEDICATIONS: No current outpatient medications on file. (Ophthalmic Drugs)   No current facility-administered medications for this visit. (Ophthalmic Drugs)   Current Outpatient Medications (Other)  Medication Sig   albuterol (VENTOLIN HFA) 108 (90 Base) MCG/ACT inhaler INHALE 2 PUFFS INTO THE LUNGS  EVERY 6 HOURS AS NEEDED FOR WHEEZING OR SHORTNESS OF BREATH   ALBUTEROL IN Inhale into the lungs as needed.   Albuterol Sulfate 2.5 MG/0.5ML NEBU Inhale into the lungs. 1 nebule every 6 hours as needed   allopurinol (ZYLOPRIM) 300 MG tablet Take 1 tablet (300 mg total) by mouth daily.   atorvastatin (LIPITOR) 40 MG tablet TAKE 1 TABLET EVERY MORNING   Coenzyme Q10 (CO Q 10 PO) Take 1 tablet by mouth daily.   fluticasone furoate-vilanterol (BREO ELLIPTA) 100-25 MCG/INH AEPB USE 1 INHALATION ORALLY    DAILY   nebivolol (BYSTOLIC) 10 MG tablet TAKE 1 TABLET DAILY   olmesartan-hydrochlorothiazide (BENICAR HCT) 40-12.5 MG tablet TAKE 1 TABLET DAILY   omeprazole (PRILOSEC) 40 MG capsule TAKE 1 CAPSULE DAILY (Patient taking differently: as needed.)   predniSONE (DELTASONE) 10 MG tablet TAKE 3 TABLETS PO QD FOR 3 DAYS THEN TAKE 2 TABLETS PO QD FOR 3 DAYS THEN TAKE 1 TABLET PO QD FOR 3 DAYS THEN TAKE 1/2 TAB PO QD FOR 3 DAYS   silodosin (RAPAFLO) 8 MG CAPS capsule Take 8 mg by mouth at bedtime.   solifenacin (  VESICARE) 5 MG tablet Take 1 tablet (5 mg total) by mouth daily.   VITAMIN D PO Take 4,000 Lipoprotein Lipase Releasing Units by mouth daily.   No current facility-administered medications for this visit. (Other)      REVIEW OF SYSTEMS:    ALLERGIES Allergies  Allergen Reactions   Merthiolate [Thimerosal] Rash    Rash--local    PAST MEDICAL HISTORY Past Medical History:  Diagnosis Date   BPH (benign prostatic hypertrophy)    GERD (gastroesophageal reflux disease)    H/O hiatal hernia    History of colon polyps    Hyperlipidemia    Hypertension    Knee internal derangement    RIGHT   Macular pucker, right eye 07/05/2019   Vitrectomy, removal of silicone oil, membrane peel, repair complex retinal detachment inferonasal right eye  11-30-19   Mild asthma    Vitamin D deficiency    Wears glasses    Past Surgical History:  Procedure Laterality Date   COLONOSCOPY W/ POLYPECTOMY   01/21/2008   ESOPHAGOGASTRODUODENOSCOPY  11/15/2008   EYE SURGERY Right    x6   Nevaya Nagele   KNEE ARTHROSCOPY Right X4  last one 2005   KNEE ARTHROSCOPY Right 12/06/2013   Procedure: ARTHROSCOPY RIGHT KNEE WITH DEBRIDMENT AND PARTIAL MEDIAL AND LATERAL MENISCECTOMY AND CHONDRLPLASTY;  Surgeon: [name], MD;  Location: [facility];  Service: Orthopedics;  Laterality: Right;   LUMBAR DISC SURGERY  04/02/2007   L5 -- S1   NEGATIVE SLEEP STUDY  per pt   PARATHYROIDECTOMY  02/04/1987   removal adenoma    FAMILY HISTORY Family History  Problem Relation Age of Onset   Hyperlipidemia Mother    Coronary artery disease Mother    Pulmonary embolism Father    Colon cancer Father    Hyperlipidemia Brother    Pancreatic cancer Brother     SOCIAL HISTORY Social History   Tobacco Use   Smoking status: Never   Smokeless tobacco: Never  Substance Use Topics   Alcohol use: Yes    Alcohol/week: 0.0 standard drinks    Comment: social use   Drug use: No         OPHTHALMIC EXAM:  Base Eye Exam     Visual Acuity (ETDRS)       Right Left   Dist [place] 20/100 20/25   Dist ph [place] 20/60 -2     Correction: Glasses         Tonometry (Tonopen, 3:58 PM)       Right Left   Pressure 15 15         Pupils       Dark Light Shape React APD   Right 4 3 Round Slow None   Left 4 3 Round Slow None         Visual Fields       Left Right   Restrictions  Total superior nasal deficiency         Neuro/Psych     Oriented x3: Yes   Mood/Affect: Normal         Dilation     Right eye: 1.0% Mydriacyl, 2.5% Phenylephrine @ 3:58 PM           Slit Lamp and Fundus Exam     External Exam       Right Left   External Normal Normal         Slit Lamp Exam       Right Left   Lids/Lashes  Normal Normal   Conjunctiva/Sclera White and quiet White and quiet   Cornea Clear Clear   Anterior Chamber Deep and quiet Deep and quiet   Iris Round and reactive Round  and reactive   Lens Posterior chamber intraocular lens, 1+ Posterior capsular opacification 1+ Nuclear sclerosis   Anterior Vitreous Normal Normal         Fundus Exam       Right Left   Posterior Vitreous Clear, vitrectomized,  Normal   Disc Normal, pink Normal   C/D Ratio 0.25 0.2   Macula No topographic distortion, attached, no vertical contraction remains of the arcades temporally Normal   Vessels Normal Normal   Periphery Former inferonasal retinal detachment, well demarcated and treated in the bed of detachment post retinotomy, retinectomy inferonasal.  Attached, with a great view in the upright position  position with 25 diopter lens 360 scleral depressed exam, 25D lens, no breaks            IMAGING AND PROCEDURES  Imaging and Procedures for 09/06/20  OCT, Retina - OU - Both Eyes       Right Eye Quality was good. Scan locations included subfoveal. Central Foveal Thickness: 247. Progression has improved. Findings include abnormal foveal contour, outer retinal atrophy, central retinal atrophy.   Left Eye Quality was good. Scan locations included subfoveal. Central Foveal Thickness: 293. Progression has been stable. Findings include normal foveal contour.   Notes Much less topographic distortion, some foveal atrophy exist centrally OD no epiretinal membrane in the macular region for well two disc diameters radius   No epiretinal tissue preproliferation.  Outer retinal photoreceptor layer irregular in the foveal region yet still recognizable OS normal contour     Color Fundus Photography Optos - OU - Both Eyes       Right Eye Progression has been stable. Disc findings include normal observations.   Left Eye Progression has been stable. Disc findings include normal observations.   Notes Clear media, no topographic distortion, good retinopexy, OD  Normal left eye             ASSESSMENT/PLAN:  Binocular vision disorder with diplopia Binocular  distortion continues.  Cystoid macular degeneration of right eye CME and retinal thickening the macula has improved and is probably reach maximal capability and provement  Retinal detachment of right eye with single break No signs of recurrence     ICD-10-CM   1. Macular pucker, right eye  H35.371 OCT, Retina - OU - Both Eyes    Color Fundus Photography Optos - OU - Both Eyes    2. Cystoid macular degeneration of right eye  H35.351     3. Binocular vision disorder with diplopia  H53.2     4. Retinal detachment of right eye with single break  H33.011       1.  2.  3.  Ophthalmic Meds Ordered this visit:  No orders of the defined types were placed in this encounter.      Return in about 6 months (around 03/09/2021).  There are no Patient Instructions on file for this visit.   Explained the diagnoses, plan, and follow up with the patient and they expressed understanding.  Patient expressed understanding of the importance of proper follow up care.   Clent Demark Enrika Aguado M.D. Diseases & Surgery of the Retina and Vitreous Retina & Diabetic [place] 09/06/20     Abbreviations: M myopia (nearsighted); A astigmatism; H hyperopia (farsighted); P presbyopia; Mrx spectacle prescription;  CTL contact lenses; OD right eye; OS left eye; OU both eyes  XT exotropia; ET esotropia; PEK punctate epithelial keratitis; PEE punctate epithelial erosions; DES dry eye syndrome; MGD meibomian gland dysfunction; ATs artificial tears; PFAT's preservative free artificial tears; [place] nuclear sclerotic cataract; PSC posterior subcapsular cataract; ERM epi-retinal membrane; PVD posterior vitreous detachment; RD retinal detachment; DM diabetes mellitus; DR diabetic retinopathy; NPDR non-proliferative diabetic retinopathy; PDR proliferative diabetic retinopathy; CSME clinically significant macular edema; DME diabetic macular edema; dbh dot blot hemorrhages; CWS cotton wool spot; POAG primary open angle  glaucoma; C/D cup-to-disc ratio; HVF humphrey visual field; GVF goldmann visual field; OCT optical coherence tomography; IOP intraocular pressure; BRVO Branch retinal vein occlusion; CRVO central retinal vein occlusion; CRAO central retinal artery occlusion; BRAO branch retinal artery occlusion; RT retinal tear; SB scleral buckle; PPV pars plana vitrectomy; VH Vitreous hemorrhage; PRP panretinal laser photocoagulation; IVK intravitreal kenalog; VMT vitreomacular traction; MH Macular hole;  NVD neovascularization of the disc; NVE neovascularization elsewhere; AREDS age related eye disease study; ARMD age related macular degeneration; POAG primary open angle glaucoma; EBMD epithelial/anterior basement membrane dystrophy; ACIOL anterior chamber intraocular lens; IOL intraocular lens; PCIOL posterior chamber intraocular lens; Phaco/IOL phacoemulsification with intraocular lens placement; [place] photorefractive keratectomy; LASIK laser assisted in situ keratomileusis; HTN hypertension; DM diabetes mellitus; COPD chronic obstructive pulmonary disease

## 2020-09-10 IMAGING — MR MR PROSTATE WO/W CM
56 series · 56 of 56 positions shown · IV contrast (Multihance 20 ml)
Comparison: None.

CLINICAL DATA: Prostate carcinoma, Gleason score 3+3=6.

EXAM:
MR PROSTATE WITHOUT AND WITH CONTRAST
TECHNIQUE: Multiplanar multisequence MRI images were obtained of the pelvis
centered about the prostate. Pre and post contrast images were
obtained.
CONTRAST:  20mL MULTIHANCE GADOBENATE DIMEGLUMINE 529 MG/ML IV SOLN

[Series 3: bSSFP fat-sat · axial · 8.0mm · 0.74mm/px · 1 of 28 slices shown]
[im 1/28]
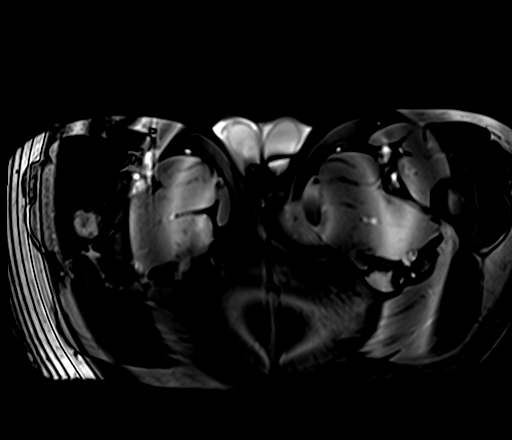

[Series 4: T1 · axial · 5.0mm · 1.25mm/px · 1 of 80 slices shown]
[im 1/80]
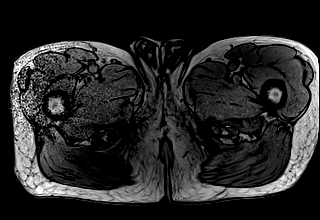

[Series 5: T2 · coronal · 3.5mm · 0.56mm/px · 1 of 23 slices shown (1 of 3)]
[im 1/23]
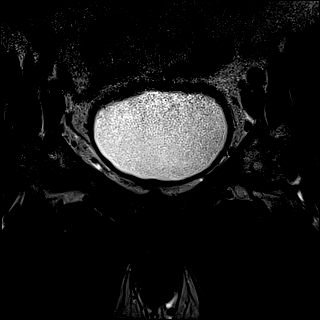

[Series 6: DWI · axial · 3.5mm · 1.56mm/px · 1 of 24 slices shown (1 of 3)]
[im 1/24]
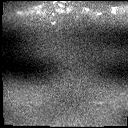

[Series 7: DWI · axial · 3.5mm · 1.75mm/px · 1 of 72 slices shown (2 of 3)]
[im 1/72]
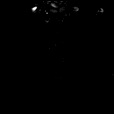

[Series 8: DWI · axial · 3.5mm · 1.75mm/px · 1 of 24 slices shown (3 of 3)]
[im 1/24]
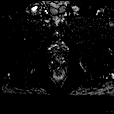

[Series 9: T2 · axial · 3.5mm · 0.56mm/px · 1 of 23 slices shown (2 of 3)]
[im 1/23]
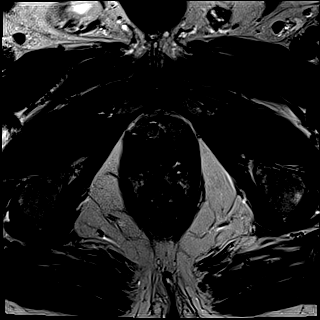

[Series 10: T2 · axial · 1.0mm · 1.04mm/px · 1 of 80 slices shown (3 of 3)]
[im 1/80]
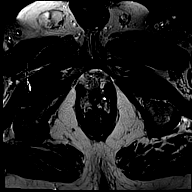

[Series 11: pre t1_twist_tra_dyn_ttc=5.8s · axial · non-contrast · 3.5mm · 0.83mm/px · 1 of 22 slices shown]
[im 1/22]
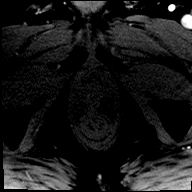

[Series 12: post t1_twist_tra_dyn-copy center · axial · 3.5mm · 0.83mm/px · 1 of 22 slices shown (1 of 24)]
[im 1/22]
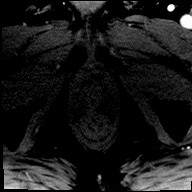

[Series 13: post t1_twist_tra_dyn-copy center · axial · 3.5mm · 0.83mm/px · 1 of 22 slices shown (2 of 24)]
[im 1/22]
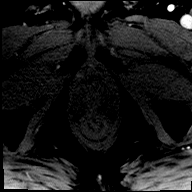

[Series 14: post t1_twist_tra_dyn-copy cent_sub_ttc=(id) · axial · 3.5mm · 0.83mm/px · 1 of 20 slices shown (1 of 23)]
[im 1/20]
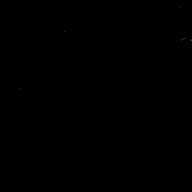

[Series 15: post t1_twist_tra_dyn-copy center · axial · 3.5mm · 0.83mm/px · 1 of 22 slices shown (3 of 24)]
[im 1/22]
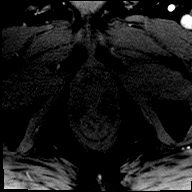

[Series 16: post t1_twist_tra_dyn-copy cent_sub_ttc=(id) · axial · 3.5mm · 0.83mm/px · 1 of 22 slices shown (2 of 23)]
[im 1/22]
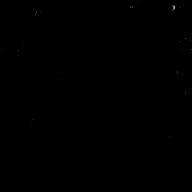

[Series 17: post t1_twist_tra_dyn-copy center · axial · 3.5mm · 0.83mm/px · 1 of 22 slices shown (4 of 24)]
[im 1/22]
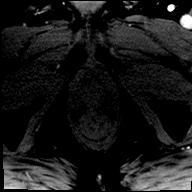

[Series 18: post t1_twist_tra_dyn-copy cent_sub_ttc=(id) · axial · 3.5mm · 0.83mm/px · 1 of 22 slices shown (3 of 23)]
[im 1/22]
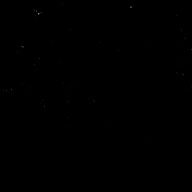

[Series 19: post t1_twist_tra_dyn-copy center · axial · 3.5mm · 0.83mm/px · 1 of 22 slices shown (5 of 24)]
[im 1/22]
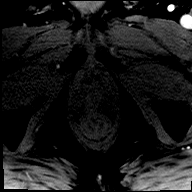

[Series 20: post t1_twist_tra_dyn-copy cent_sub_ttc=(id) · axial · 3.5mm · 0.83mm/px · 1 of 22 slices shown (4 of 23)]
[im 1/22]
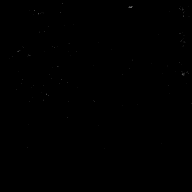

[Series 21: post t1_twist_tra_dyn-copy center · axial · 3.5mm · 0.83mm/px · 1 of 22 slices shown (6 of 24)]
[im 1/22]
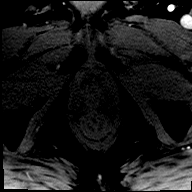

[Series 22: post t1_twist_tra_dyn-copy cent_sub_ttc=(id) · axial · 3.5mm · 0.83mm/px · 1 of 22 slices shown (5 of 23)]
[im 1/22]
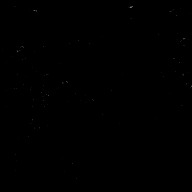

[Series 23: post t1_twist_tra_dyn-copy center · axial · 3.5mm · 0.83mm/px · 1 of 22 slices shown (7 of 24)]
[im 1/22]
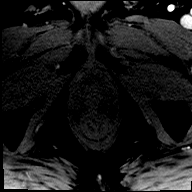

[Series 24: post t1_twist_tra_dyn-copy cent_sub_ttc=(id) · axial · 3.5mm · 0.83mm/px · 1 of 22 slices shown (6 of 23)]
[im 1/22]
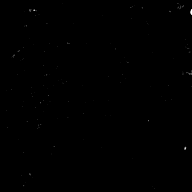

[Series 25: post t1_twist_tra_dyn-copy center · axial · 3.5mm · 0.83mm/px · 1 of 22 slices shown (8 of 24)]
[im 1/22]
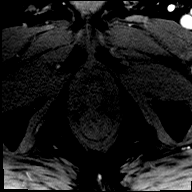

[Series 26: post t1_twist_tra_dyn-copy cent_sub_ttc=(id) · axial · 3.5mm · 0.83mm/px · 1 of 22 slices shown (7 of 23)]
[im 1/22]
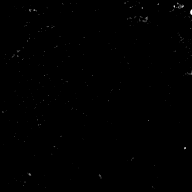

[Series 27: post t1_twist_tra_dyn-copy center · axial · 3.5mm · 0.83mm/px · 1 of 22 slices shown (9 of 24)]
[im 1/22]
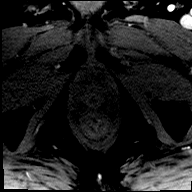

[Series 28: post t1_twist_tra_dyn-copy cent_sub_ttc=(id) · axial · 3.5mm · 0.83mm/px · 1 of 22 slices shown (8 of 23)]
[im 1/22]
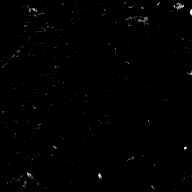

[Series 29: post t1_twist_tra_dyn-copy center · axial · 3.5mm · 0.83mm/px · 1 of 22 slices shown (10 of 24)]
[im 1/22]
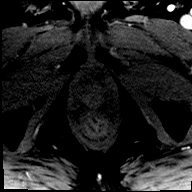

[Series 30: post t1_twist_tra_dyn-copy cent_sub_ttc=(id) · axial · 3.5mm · 0.83mm/px · 1 of 22 slices shown (9 of 23)]
[im 1/22]
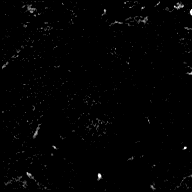

[Series 31: post t1_twist_tra_dyn-copy center · axial · 3.5mm · 0.83mm/px · 1 of 22 slices shown (11 of 24)]
[im 1/22]
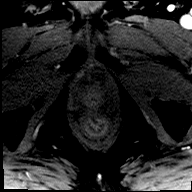

[Series 32: post t1_twist_tra_dyn-copy cent_sub_ttc=(id) · axial · 3.5mm · 0.83mm/px · 1 of 22 slices shown (10 of 23)]
[im 1/22]
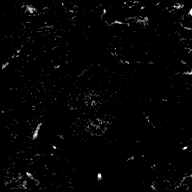

[Series 33: post t1_twist_tra_dyn-copy center · axial · 3.5mm · 0.83mm/px · 1 of 22 slices shown (12 of 24)]
[im 1/22]
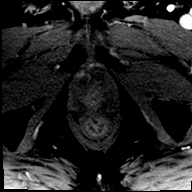

[Series 34: post t1_twist_tra_dyn-copy cent_sub_ttc=(id) · axial · 3.5mm · 0.83mm/px · 1 of 22 slices shown (11 of 23)]
[im 1/22]
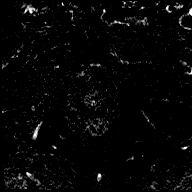

[Series 35: post t1_twist_tra_dyn-copy center · axial · 3.5mm · 0.83mm/px · 1 of 22 slices shown (13 of 24)]
[im 1/22]
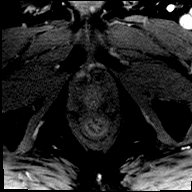

[Series 36: post t1_twist_tra_dyn-copy cent_sub_ttc=(id) · axial · 3.5mm · 0.83mm/px · 1 of 22 slices shown (12 of 23)]
[im 1/22]
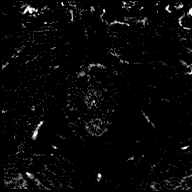

[Series 37: post t1_twist_tra_dyn-copy center · axial · 3.5mm · 0.83mm/px · 1 of 22 slices shown (14 of 24)]
[im 1/22]
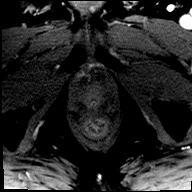

[Series 38: post t1_twist_tra_dyn-copy cent_sub_ttc=(id) · axial · 3.5mm · 0.83mm/px · 1 of 22 slices shown (13 of 23)]
[im 1/22]
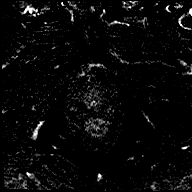

[Series 39: post t1_twist_tra_dyn-copy center · axial · 3.5mm · 0.83mm/px · 1 of 22 slices shown (15 of 24)]
[im 1/22]
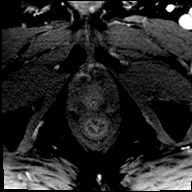

[Series 40: post t1_twist_tra_dyn-copy cent_sub_ttc=(id) · axial · 3.5mm · 0.83mm/px · 1 of 22 slices shown (14 of 23)]
[im 1/22]
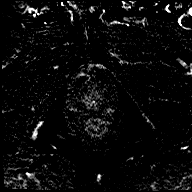

[Series 41: post t1_twist_tra_dyn-copy center · axial · 3.5mm · 0.83mm/px · 1 of 22 slices shown (16 of 24)]
[im 1/22]
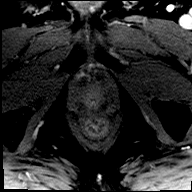

[Series 42: post t1_twist_tra_dyn-copy cent_sub_ttc=(id) · axial · 3.5mm · 0.83mm/px · 1 of 22 slices shown (15 of 23)]
[im 1/22]
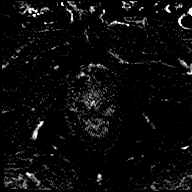

[Series 43: post t1_twist_tra_dyn-copy center · axial · 3.5mm · 0.83mm/px · 1 of 22 slices shown (17 of 24)]
[im 1/22]
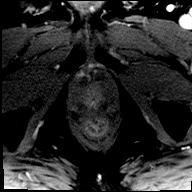

[Series 44: post t1_twist_tra_dyn-copy cent_sub_ttc=(id) · axial · 3.5mm · 0.83mm/px · 1 of 22 slices shown (16 of 23)]
[im 1/22]
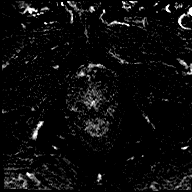

[Series 45: post t1_twist_tra_dyn-copy center · axial · 3.5mm · 0.83mm/px · 1 of 22 slices shown (18 of 24)]
[im 1/22]
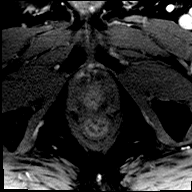

[Series 46: post t1_twist_tra_dyn-copy cent_sub_ttc=(id) · axial · 3.5mm · 0.83mm/px · 1 of 22 slices shown (17 of 23)]
[im 1/22]
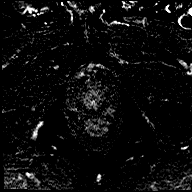

[Series 47: post t1_twist_tra_dyn-copy center · axial · 3.5mm · 0.83mm/px · 1 of 22 slices shown (19 of 24)]
[im 1/22]
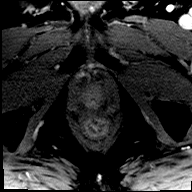

[Series 48: post t1_twist_tra_dyn-copy cent_sub_ttc=(id) · axial · 3.5mm · 0.83mm/px · 1 of 22 slices shown (18 of 23)]
[im 1/22]
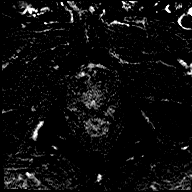

[Series 49: post t1_twist_tra_dyn-copy center · axial · 3.5mm · 0.83mm/px · 1 of 22 slices shown (20 of 24)]
[im 1/22]
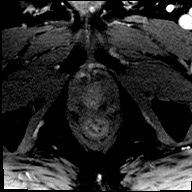

[Series 50: post t1_twist_tra_dyn-copy cent_sub_ttc=(id) · axial · 3.5mm · 0.83mm/px · 1 of 22 slices shown (19 of 23)]
[im 1/22]
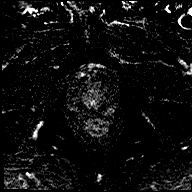

[Series 51: post t1_twist_tra_dyn-copy center · axial · 3.5mm · 0.83mm/px · 1 of 22 slices shown (21 of 24)]
[im 1/22]
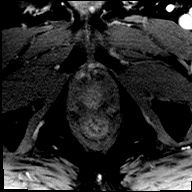

[Series 52: post t1_twist_tra_dyn-copy cent_sub_ttc=(id) · axial · 3.5mm · 0.83mm/px · 1 of 22 slices shown (20 of 23)]
[im 1/22]
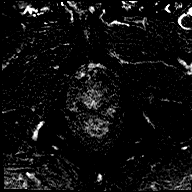

[Series 53: post t1_twist_tra_dyn-copy center · axial · 3.5mm · 0.83mm/px · 1 of 22 slices shown (22 of 24)]
[im 1/22]
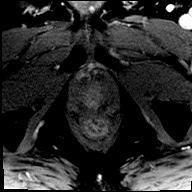

[Series 54: post t1_twist_tra_dyn-copy cent_sub_ttc=(id) · axial · 3.5mm · 0.83mm/px · 1 of 22 slices shown (21 of 23)]
[im 1/22]
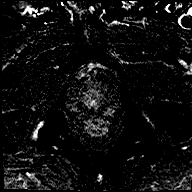

[Series 55: post t1_twist_tra_dyn-copy center · axial · 3.5mm · 0.83mm/px · 1 of 22 slices shown (23 of 24)]
[im 1/22]
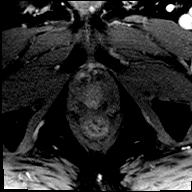

[Series 56: post t1_twist_tra_dyn-copy cent_sub_ttc=(id) · axial · 3.5mm · 0.83mm/px · 1 of 22 slices shown (22 of 23)]
[im 1/22]
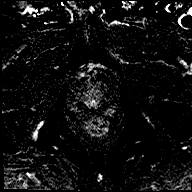

[Series 57: post t1_twist_tra_dyn-copy center · axial · 3.5mm · 0.83mm/px · 1 of 22 slices shown (24 of 24)]
[im 1/22]
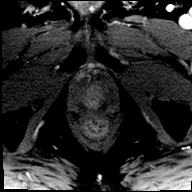

[Series 58: post t1_twist_tra_dyn-copy cent_sub_ttc=(id) · axial · 3.5mm · 0.83mm/px · 1 of 22 slices shown (23 of 23)]
[im 1/22]
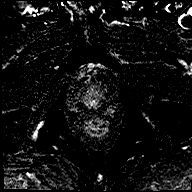

[56 of 56 positions shown; findings below may reference images not displayed]

FINDINGS: Prostate:

-- Peripheral Zone: No abnormality seen on ADC and high b-value DWI
sequences.

-- Transition/Central Zone: Circumscribed BPH nodules are noted, but
no suspicious nodules with obscured or non-circumscribed margins
seen. Prominent median lobe hypertrophy is seen indenting the
bladder.

-- Measurements/Volume:  6.7 x 4.4 x 5.5 cm (volume = 85 cm^3)

Transcapsular spread:  Absent

Seminal vesicle involvement:  Absent

Neurovascular bundle involvement:  Absent

Pelvic adenopathy: None visualized

Bone metastasis: None visualized

Other: Sigmoid diverticulosis noted, without evidence of
diverticulitis. A small right inguinal hernia is seen containing
only fat.
IMPRESSION: No radiographic evidence of high-grade prostate carcinoma. PI-RADS
1: Very Low (clinically significant cancer is highly unlikely to be
present).

## 2020-09-27 DIAGNOSIS — Z800 Family history of malignant neoplasm of digestive organs: Secondary | ICD-10-CM | POA: Diagnosis not present

## 2020-09-27 DIAGNOSIS — K5730 Diverticulosis of large intestine without perforation or abscess without bleeding: Secondary | ICD-10-CM | POA: Diagnosis not present

## 2020-09-27 DIAGNOSIS — D123 Benign neoplasm of transverse colon: Secondary | ICD-10-CM | POA: Diagnosis not present

## 2020-09-27 DIAGNOSIS — K635 Polyp of colon: Secondary | ICD-10-CM | POA: Diagnosis not present

## 2020-09-27 DIAGNOSIS — Z86010 Personal history of colonic polyps: Secondary | ICD-10-CM | POA: Diagnosis not present

## 2020-09-27 HISTORY — PX: COLONOSCOPY: SHX174

## 2020-11-26 DIAGNOSIS — C61 Malignant neoplasm of prostate: Secondary | ICD-10-CM | POA: Diagnosis not present

## 2020-12-14 DIAGNOSIS — M25511 Pain in right shoulder: Secondary | ICD-10-CM | POA: Diagnosis not present

## 2020-12-14 DIAGNOSIS — M25512 Pain in left shoulder: Secondary | ICD-10-CM | POA: Diagnosis not present

## 2020-12-24 DIAGNOSIS — R030 Elevated blood-pressure reading, without diagnosis of hypertension: Secondary | ICD-10-CM | POA: Diagnosis not present

## 2020-12-24 DIAGNOSIS — Z6827 Body mass index (BMI) 27.0-27.9, adult: Secondary | ICD-10-CM | POA: Diagnosis not present

## 2020-12-24 DIAGNOSIS — M5412 Radiculopathy, cervical region: Secondary | ICD-10-CM | POA: Diagnosis not present

## 2020-12-31 DIAGNOSIS — M25512 Pain in left shoulder: Secondary | ICD-10-CM | POA: Diagnosis not present

## 2021-01-07 DIAGNOSIS — M75102 Unspecified rotator cuff tear or rupture of left shoulder, not specified as traumatic: Secondary | ICD-10-CM | POA: Diagnosis not present

## 2021-01-11 ENCOUNTER — Other Ambulatory Visit: Payer: Self-pay | Admitting: Family Medicine

## 2021-01-11 DIAGNOSIS — I10 Essential (primary) hypertension: Secondary | ICD-10-CM

## 2021-01-11 DIAGNOSIS — E785 Hyperlipidemia, unspecified: Secondary | ICD-10-CM

## 2021-01-24 ENCOUNTER — Encounter: Payer: Self-pay | Admitting: Family Medicine

## 2021-01-24 ENCOUNTER — Other Ambulatory Visit: Payer: Self-pay | Admitting: Family Medicine

## 2021-01-24 DIAGNOSIS — J40 Bronchitis, not specified as acute or chronic: Secondary | ICD-10-CM

## 2021-01-24 DIAGNOSIS — U071 COVID-19: Secondary | ICD-10-CM

## 2021-01-24 MED ORDER — AZITHROMYCIN 250 MG PO TABS: ORAL_TABLET | ORAL | 0 refills | Status: DC

## 2021-01-24 MED ORDER — PREDNISONE 10 MG PO TABS: ORAL_TABLET | ORAL | 0 refills | Status: DC

## 2021-01-25 NOTE — Telephone Encounter (Signed)
Noted  

## 2021-01-30 DIAGNOSIS — M25512 Pain in left shoulder: Secondary | ICD-10-CM | POA: Diagnosis not present

## 2021-02-15 DIAGNOSIS — M25512 Pain in left shoulder: Secondary | ICD-10-CM | POA: Diagnosis not present

## 2021-02-18 DIAGNOSIS — M25512 Pain in left shoulder: Secondary | ICD-10-CM | POA: Diagnosis not present

## 2021-02-28 ENCOUNTER — Other Ambulatory Visit: Payer: Self-pay | Admitting: Family Medicine

## 2021-02-28 DIAGNOSIS — K219 Gastro-esophageal reflux disease without esophagitis: Secondary | ICD-10-CM

## 2021-03-14 ENCOUNTER — Other Ambulatory Visit: Payer: Self-pay

## 2021-03-14 ENCOUNTER — Ambulatory Visit (INDEPENDENT_AMBULATORY_CARE_PROVIDER_SITE_OTHER): Payer: Federal, State, Local not specified - PPO | Admitting: Ophthalmology

## 2021-03-14 ENCOUNTER — Encounter (INDEPENDENT_AMBULATORY_CARE_PROVIDER_SITE_OTHER): Payer: Self-pay | Admitting: Ophthalmology

## 2021-03-14 DIAGNOSIS — H35371 Puckering of macula, right eye: Secondary | ICD-10-CM | POA: Diagnosis not present

## 2021-03-14 DIAGNOSIS — H532 Diplopia: Secondary | ICD-10-CM | POA: Diagnosis not present

## 2021-03-14 DIAGNOSIS — H43812 Vitreous degeneration, left eye: Secondary | ICD-10-CM

## 2021-03-14 DIAGNOSIS — H35351 Cystoid macular degeneration, right eye: Secondary | ICD-10-CM

## 2021-03-14 DIAGNOSIS — H33011 Retinal detachment with single break, right eye: Secondary | ICD-10-CM | POA: Diagnosis not present

## 2021-03-14 NOTE — Assessment & Plan Note (Signed)
OD retina attached, no new changes

## 2021-03-14 NOTE — Assessment & Plan Note (Signed)
OD resolved

## 2021-03-14 NOTE — Assessment & Plan Note (Signed)
Lengthy discussion, regarding the torsional diplopia that continues even with maximal prism tolerated, the prism in fact he is wearing now does not fact bring the images closer but in fact is more symptomatic for fusion to the brain for the patient.  We have discussed the potential of seeking opinion regarding potential muscle surgery to decrease the rotational torsional distortion in order to bring a more binocular at least peripheral vision to his ongoing activities.

## 2021-03-14 NOTE — Assessment & Plan Note (Signed)
Progression of PVD as demonstrable by low-lying posterior vitreous detachment On OCT seen August 2022, now with enhanced syneresis leading to progressive peripheral changes likely his new floaters.  However there is no hole or tear on careful dilated examination 25 diopter to the ora serrata

## 2021-03-14 NOTE — Progress Notes (Signed)
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 Pt states "I think my vision is getting worse in my left eye, gradually worse. I have new floaters in the left eye. I talked to Dr. Zadie Rhine about it last visit but it has worsened since then. I notice it when looking at the sky or a lighter backdrop."       Last edited by Laurin Coder on 03/14/2021  4:05 PM.      Referring physician: [name], [name], DO [address] [place],  [place] [address]  HISTORICAL INFORMATION:   Selected notes from the MEDICAL RECORD NUMBER    Lab Results  Component Value Date   HGBA1C 6.1 07/12/2020     CURRENT MEDICATIONS: No current outpatient medications on file. (Ophthalmic Drugs)   No current facility-administered medications for this visit. (Ophthalmic Drugs)   Current Outpatient Medications (Other)  Medication Sig   albuterol (VENTOLIN HFA) 108 (90 Base) MCG/ACT inhaler INHALE 2 PUFFS INTO THE LUNGS EVERY 6 HOURS AS NEEDED FOR WHEEZING OR SHORTNESS OF BREATH   ALBUTEROL IN Inhale into the lungs as needed.   Albuterol Sulfate 2.5 MG/0.5ML NEBU Inhale into the lungs. 1 nebule every 6 hours as needed   allopurinol (ZYLOPRIM) 300 MG tablet Take 1 tablet (300 mg total) by mouth daily.   atorvastatin (LIPITOR) 40 MG tablet TAKE 1 TABLET EVERY MORNING   azithromycin (ZITHROMAX Z-PAK) 250 MG tablet As directed   Coenzyme Q10 (CO Q 10 PO) Take 1 tablet by mouth daily.   fluticasone furoate-vilanterol (BREO ELLIPTA) 100-25 MCG/INH AEPB USE 1 INHALATION ORALLY    DAILY    nebivolol (BYSTOLIC) 10 MG tablet TAKE 1 TABLET DAILY   olmesartan-hydrochlorothiazide (BENICAR HCT) 40-12.5 MG tablet TAKE 1 TABLET DAILY   omeprazole (PRILOSEC) 40 MG capsule TAKE 1 CAPSULE DAILY   predniSONE (DELTASONE) 10 MG tablet TAKE 3 TABLETS PO QD FOR 3 DAYS THEN TAKE 2 TABLETS PO QD FOR 3 DAYS THEN TAKE 1 TABLET PO QD FOR 3 DAYS THEN TAKE 1/2 TAB PO QD FOR 3 DAYS   silodosin (RAPAFLO) 8 MG CAPS capsule Take 8 mg by mouth at bedtime.   solifenacin (VESICARE) 5 MG tablet Take 1 tablet (5 mg total) by mouth daily.   VITAMIN D PO Take 4,000 Lipoprotein Lipase Releasing Units by mouth daily.   No current facility-administered medications for this visit. (Other)      REVIEW OF SYSTEMS:    ALLERGIES Allergies  Allergen Reactions   Merthiolate [Thimerosal] Rash    Rash--local    PAST MEDICAL HISTORY Past Medical History:  Diagnosis Date   BPH (benign prostatic hypertrophy)    GERD (gastroesophageal reflux disease)    H/O hiatal hernia    History of colon polyps    Hyperlipidemia    Hypertension    Knee internal derangement  RIGHT   Macular pucker, right eye 07/05/2019   Vitrectomy, removal of silicone oil, membrane peel, repair complex retinal detachment inferonasal right eye  11-30-19   Mild asthma    Vitamin D deficiency    Wears glasses    Past Surgical History:  Procedure Laterality Date   COLONOSCOPY W/ POLYPECTOMY  01/21/2008   ESOPHAGOGASTRODUODENOSCOPY  11/15/2008   EYE SURGERY Right    x6   [facility]omin[facility] Calvey   KNEE ARTHROSCOPY Right X4  last one 2005   KNEE ARTHROSCOPY Right 12/06/2013   Procedure: ARTHROSCOPY RIGHT KNEE WITH DEBRIDMENT AND PARTIAL MEDIAL AND LATERAL MENISCECTOMY AND CHONDRLPLASTY;  Surgeon: [name], MD;  Location: [facility];  Service: Orthopedics;  Laterality: Right;   LUMBAR DISC SURGERY  04/02/2007   L5 -- S1   NEGATIVE SLEEP STUDY  per pt   PARATHYROIDECTOMY  02/04/1987   removal adenoma    FAMILY HISTORY Family  History  Problem Relation Age of Onset   Hyperlipidemia Mother    Coronary artery disease Mother    Pulmonary embolism Father    Colon cancer Father    Hyperlipidemia Brother    Pancreatic cancer Brother     SOCIAL HISTORY Social History   Tobacco Use   Smoking status: Never   Smokeless tobacco: Never  Substance Use Topics   Alcohol use: Yes    Alcohol/week: 0.0 standard drinks    Comment: social use   Drug use: No         OPHTHALMIC EXAM:  Base Eye Exam     Visual Acuity (ETDRS)       Right Left   Dist cc 160 20/25   Dist ph cc 20/125 -1     Correction: Glasses         Tonometry (Tonopen, 4:08 PM)       Right Left   Pressure 18 18         Pupils       Pupils Dark Light Shape React APD   Right PERRL 3 2 Round Brisk None   Left PERRL 3 2 Round Brisk None         Visual Fields (Counting fingers)       Left Right    Full    Restrictions  Partial outer superior temporal deficiency         Extraocular Movement       Right Left    Full Full         Neuro/Psych     Oriented x3: Yes   Mood/Affect: Normal         Dilation     Both eyes: 1.0% Mydriacyl, 2.5% Phenylephrine @ 4:08 PM           Slit Lamp and Fundus Exam     External Exam       Right Left   External Normal Normal         Slit Lamp Exam       Right Left   Lids/Lashes Normal Normal   Conjunctiva/Sclera White and quiet White and quiet   Cornea Clear Clear   Anterior Chamber Deep and quiet Deep and quiet   Iris Round and reactive Round and reactive   Lens Posterior chamber intraocular lens, 1+ Posterior capsular opacification 1+ Nuclear sclerosis   Anterior Vitreous Normal Normal         Fundus Exam       Right Left   Posterior Vitreous Clear, vitrectomized,  Normal,  Disc Normal, pink Normal   C/D Ratio 0.25 0.2   Macula No topographic distortion, attached, no vertical contraction remains of the arcades temporally Normal   Vessels Normal Normal    Periphery Former inferonasal retinal detachment, well demarcated and treated in the bed of detachment post retinotomy, retinectomy inferonasal.  Attached, with a great view in the upright position  position with 25 diopter lens 360 scleral depressed exam, 25D lens, no breaks            IMAGING AND PROCEDURES  Imaging and Procedures for 03/14/21  OCT, Retina - OU - Both Eyes       Right Eye Quality was good. Scan locations included subfoveal. Central Foveal Thickness: 272. Progression has improved. Findings include abnormal foveal contour, outer retinal atrophy, central retinal atrophy.   Left Eye Quality was good. Scan locations included subfoveal. Central Foveal Thickness: 290. Progression has been stable. Findings include normal foveal contour.   Notes Much less topographic distortion, some foveal atrophy exist centrally OD no epiretinal membrane in the macular region for well two disc diameters radius   No epiretinal tissue preproliferation.  Outer retinal photoreceptor layer irregular in the foveal region yet still recognizable OS normal contour     Color Fundus Photography Optos - OU - Both Eyes       Right Eye Progression has been stable. Disc findings include normal observations.   Left Eye Progression has been stable. Disc findings include normal observations.   Notes Clear media, no topographic distortion, good retinopexy, OD  Normal left eye             ASSESSMENT/PLAN:  Binocular vision disorder with diplopia Lengthy discussion, regarding the torsional diplopia that continues even with maximal prism tolerated, the prism in fact he is wearing now does not fact bring the images closer but in fact is more symptomatic for fusion to the brain for the patient.  We have discussed the potential of seeking opinion regarding potential muscle surgery to decrease the rotational torsional distortion in order to bring a more binocular at least peripheral vision  to his ongoing activities.  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  However there is no hole or tear on careful dilated examination 25 diopter to the ora serrata     ICD-10-CM   1. Macular pucker, right eye  H35.371 OCT, Retina - OU - Both Eyes    Color Fundus Photography Optos - OU - Both Eyes    2. Binocular vision disorder with diplopia  H53.2     3. Retinal detachment of right eye with single break  H33.011     4. Posterior vitreous detachment, left eye  H43.812     5. Cystoid macular degeneration of right eye  H35.351     6. PVD (posterior vitreous detachment), left eye  H43.812       1.  After lengthy discussion we will proceed with seeking second opinion evaluation either potentially at [facility] in [place] at [facility] or at [facility].  In either case this will be an attempt to look at whether ophthalmic muscle surgery would be appropriate to try to diminish the torsional torsion of  diplopia that he now suffers in the setting of a eye with a scleral buckle on  2.  3.  Ophthalmic Meds Ordered this visit:  No orders of the defined types were placed in this encounter.      Return in about 6 months (around 09/11/2021) for DILATE OU, OCT, COLOR FP.  There are no Patient Instructions on file for this visit.   Explained the diagnoses, plan, and follow up with the patient and they expressed understanding.  Patient expressed understanding of the importance of proper follow up care.   Clent Demark Naysa Puskas M.D. Diseases & Surgery of the Retina and Vitreous Retina & Diabetic [place] 03/14/21     Abbreviations: M myopia (nearsighted); A astigmatism; H hyperopia (farsighted); P presbyopia; Mrx spectacle prescription;  CTL contact lenses; OD right eye; OS left eye; OU both eyes  XT exotropia; ET esotropia; PEK punctate epithelial keratitis; PEE punctate epithelial erosions; DES dry eye syndrome; MGD meibomian gland dysfunction; ATs artificial tears; PFAT's preservative free artificial tears; [place] nuclear sclerotic cataract; PSC posterior subcapsular cataract; ERM epi-retinal membrane; PVD posterior vitreous detachment; RD retinal detachment; DM diabetes mellitus; DR diabetic retinopathy; NPDR non-proliferative diabetic retinopathy; PDR proliferative diabetic retinopathy; CSME clinically significant macular edema; DME diabetic macular edema; dbh dot blot hemorrhages; CWS cotton wool spot; POAG primary open angle glaucoma; C/D cup-to-disc ratio; HVF humphrey visual field; GVF goldmann visual field; OCT optical coherence tomography; IOP intraocular pressure; BRVO Branch retinal vein occlusion; CRVO central retinal vein occlusion; CRAO central retinal artery occlusion; BRAO branch retinal artery occlusion; RT retinal tear; SB scleral buckle; PPV pars plana vitrectomy; VH Vitreous hemorrhage; PRP panretinal laser photocoagulation; IVK intravitreal kenalog; VMT vitreomacular traction; MH Macular hole;  NVD neovascularization of the disc; NVE neovascularization elsewhere; AREDS age related eye disease study; ARMD age related macular degeneration; POAG primary open angle glaucoma; EBMD epithelial/anterior basement membrane dystrophy; ACIOL anterior chamber intraocular lens; IOL intraocular lens; PCIOL posterior chamber intraocular lens; Phaco/IOL phacoemulsification with intraocular lens placement; [place] photorefractive keratectomy; LASIK laser assisted in situ keratomileusis; HTN hypertension; DM diabetes mellitus; COPD chronic obstructive pulmonary disease

## 2021-03-18 ENCOUNTER — Other Ambulatory Visit: Payer: Self-pay

## 2021-03-18 ENCOUNTER — Encounter (INDEPENDENT_AMBULATORY_CARE_PROVIDER_SITE_OTHER): Payer: Federal, State, Local not specified - PPO | Admitting: Ophthalmology

## 2021-03-18 ENCOUNTER — Ambulatory Visit (INDEPENDENT_AMBULATORY_CARE_PROVIDER_SITE_OTHER): Payer: Federal, State, Local not specified - PPO | Admitting: Ophthalmology

## 2021-03-18 ENCOUNTER — Encounter (INDEPENDENT_AMBULATORY_CARE_PROVIDER_SITE_OTHER): Payer: Self-pay | Admitting: Ophthalmology

## 2021-03-18 DIAGNOSIS — H43312 Vitreous membranes and strands, left eye: Secondary | ICD-10-CM | POA: Insufficient documentation

## 2021-03-18 DIAGNOSIS — H4312 Vitreous hemorrhage, left eye: Secondary | ICD-10-CM | POA: Diagnosis not present

## 2021-03-18 DIAGNOSIS — H43812 Vitreous degeneration, left eye: Secondary | ICD-10-CM | POA: Diagnosis not present

## 2021-03-18 NOTE — Assessment & Plan Note (Addendum)
Careful dilated examination using 25 diopter examination scleral depression, 20 diopter magnification as well as 90 diopter close-up and followed thereafter by review of the B-scan ultrasound performed as I was dilating, no retinal holes or tears area of traction noted at the 830 meridian anteriorly by ultrasound   confirmed by vitreous base hemorrhage on clinical examination in the office

## 2021-03-18 NOTE — Progress Notes (Signed)
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  No sparkling lights however but in no decline in acuity but now veils and strands in the line of vision OS  Upon examination, and with the multiple sites of intraretinal hemorrhages associated with vitreous base traction, I asked the patient whether or not there been any recent thick heavy-duty exertion or Valsalva maneuvers.  He reports having a hard bike ride for an hour on Friday 03-15-2021, with heavy-duty exertion      Last edited by Hurman Horn, MD on 03/18/2021  1:46 PM.      Referring physician: [name], [name], DO [address] [place],  [place] [address]  HISTORICAL INFORMATION:   Selected notes from the MEDICAL RECORD NUMBER    Lab Results  Component Value Date   HGBA1C 6.1 07/12/2020     CURRENT MEDICATIONS: No current outpatient medications on file. (Ophthalmic Drugs)   No current facility-administered medications for this visit. (Ophthalmic Drugs)   Current Outpatient Medications (Other)  Medication Sig   albuterol (VENTOLIN HFA) 108 (90 Base) MCG/ACT inhaler INHALE 2 PUFFS INTO THE LUNGS EVERY 6 HOURS AS NEEDED FOR WHEEZING OR SHORTNESS OF BREATH   ALBUTEROL IN Inhale into the lungs as needed.   Albuterol Sulfate 2.5 MG/0.5ML NEBU Inhale into the lungs. 1 nebule every 6 hours as needed   allopurinol (ZYLOPRIM) 300 MG tablet Take 1 tablet (300 mg total) by mouth daily.   atorvastatin (LIPITOR) 40 MG tablet TAKE 1 TABLET EVERY MORNING   azithromycin (ZITHROMAX Z-PAK) 250 MG tablet As  directed   Coenzyme Q10 (CO Q 10 PO) Take 1 tablet by mouth daily.   fluticasone furoate-vilanterol (BREO ELLIPTA) 100-25 MCG/INH AEPB USE 1 INHALATION ORALLY    DAILY   nebivolol (BYSTOLIC) 10 MG tablet TAKE 1 TABLET DAILY   olmesartan-hydrochlorothiazide (BENICAR HCT) 40-12.5 MG tablet TAKE 1 TABLET DAILY   omeprazole (PRILOSEC) 40 MG capsule TAKE 1 CAPSULE DAILY   predniSONE (DELTASONE) 10 MG tablet TAKE 3 TABLETS PO QD FOR 3 DAYS THEN TAKE 2 TABLETS PO QD FOR 3 DAYS THEN TAKE 1 TABLET PO QD FOR 3 DAYS THEN TAKE 1/2 TAB PO QD FOR 3 DAYS   silodosin (RAPAFLO) 8 MG CAPS capsule Take 8 mg by mouth at bedtime.   solifenacin (VESICARE) 5 MG tablet Take 1 tablet (5 mg total) by mouth daily.   VITAMIN D PO Take 4,000 Lipoprotein Lipase Releasing Units by mouth daily.   No current facility-administered medications for this visit. (Other)      REVIEW OF SYSTEMS: ROS   Negative for: Constitutional, Gastrointestinal, Neurological, Skin, Genitourinary, Musculoskeletal, HENT, Endocrine, Cardiovascular, Eyes, Respiratory, Psychiatric, Allergic/Imm, Heme/Lymph Last edited by Hurman Horn, MD on 03/18/2021  1:08 PM.       ALLERGIES Allergies  Allergen Reactions   Merthiolate [Thimerosal] Rash    Rash--local    PAST MEDICAL  HISTORY Past Medical History:  Diagnosis Date   BPH (benign prostatic hypertrophy)    GERD (gastroesophageal reflux disease)    H/O hiatal hernia    History of colon polyps    Hyperlipidemia    Hypertension    Knee internal derangement    RIGHT   Macular pucker, right eye 07/05/2019   Vitrectomy, removal of silicone oil, membrane peel, repair complex retinal detachment inferonasal right eye  11-30-19   Mild asthma    Vitamin D deficiency    Wears glasses    Past Surgical History:  Procedure Laterality Date   COLONOSCOPY W/ POLYPECTOMY  01/21/2008   ESOPHAGOGASTRODUODENOSCOPY  11/15/2008   EYE SURGERY Right    x6   [facility]   KNEE ARTHROSCOPY Right X4  last one  2005   KNEE ARTHROSCOPY Right 12/06/2013   Procedure: ARTHROSCOPY RIGHT KNEE WITH DEBRIDMENT AND PARTIAL MEDIAL AND LATERAL MENISCECTOMY AND CHONDRLPLASTY;  Surgeon: [name], MD;  Location: [facility];  Service: Orthopedics;  Laterality: Right;   LUMBAR DISC SURGERY  04/02/2007   L5 -- S1   NEGATIVE SLEEP STUDY  per pt   PARATHYROIDECTOMY  02/04/1987   removal adenoma    FAMILY HISTORY Family History  Problem Relation Age of Onset   Hyperlipidemia Mother    Coronary artery disease Mother    Pulmonary embolism Father    Colon cancer Father    Hyperlipidemia Brother    Pancreatic cancer Brother     SOCIAL HISTORY Social History   Tobacco Use   Smoking status: Never   Smokeless tobacco: Never  Substance Use Topics   Alcohol use: Yes    Alcohol/week: 0.0 standard drinks    Comment: social use   Drug use: No         OPHTHALMIC EXAM:  Base Eye Exam     Visual Acuity (ETDRS)       Right Left   Dist cc 20/150 20/15    Correction: Glasses         Tonometry (Tonopen, 1:08 PM)       Right Left   Pressure 14 14         Dilation     Left eye: 1.0% Mydriacyl, 2.5% Phenylephrine @ 1:08 PM            IMAGING AND PROCEDURES  Imaging and Procedures for 03/18/21  B-Scan Ultrasound - OS - Left Eye       Quality was good. Findings included normal observations, vitreous opacities.   Notes No retinal holes or tears seen on B-scan both static and kinetic.  Area of interest at 8:00 meridian anterior with focal area of vitreal retinal traction no discernible flap and no subretinal fluid in all quadrants             ASSESSMENT/PLAN:  PVD (posterior vitreous detachment), left eye Careful dilated examination using 25 diopter examination scleral depression, 20 diopter magnification as well as 90 diopter close-up and followed thereafter by review of the B-scan ultrasound performed as I was dilating, no retinal holes or tears area of  traction noted at the 830 meridian anteriorly by ultrasound   confirmed by vitreous base hemorrhage on clinical examination in the office     ICD-10-CM   1. Vitreous hemorrhage, left eye ([place])  H43.12     2. PVD (posterior vitreous detachment), left eye  H43.812 B-Scan Ultrasound - OS - Left Eye    3. Vitreous membranes and strands, left  H43.312 B-Scan Ultrasound -  OS - Left Eye      1. OS, with new onset vitreous hemorrhage related to vitreous traction of vitreous base.  3 individual sites of vitreous traction identified in the nasal vitreous base.  Clinical examination exhaustive as well as B-scan ultrasound confirm there are no retinal tears or detachments today. 2.  Careful and extensive review of symptoms were reviewed and that the patient's current floaters are the new baseline and that any profound worsening of his current floaters would be reason to contact the office or me personally for reevaluation.  Certainly if the symptoms are accompanied by sparklers and flashes of light  3.  Ophthalmic Meds Ordered this visit:  No orders of the defined types were placed in this encounter.      Return for dilate, OS, COLOR FP, OCT, B-scan OS.  There are no Patient Instructions on file for this visit.   Explained the diagnoses, plan, and follow up with the patient and they expressed understanding.  Patient expressed understanding of the importance of proper follow up care.   Clent Demark Alric Geise M.D. Diseases & Surgery of the Retina and Vitreous Retina & Diabetic [place] 03/18/21     Abbreviations: M myopia (nearsighted); A astigmatism; H hyperopia (farsighted); P presbyopia; Mrx spectacle prescription;  CTL contact lenses; OD right eye; OS left eye; OU both eyes  XT exotropia; ET esotropia; PEK punctate epithelial keratitis; PEE punctate epithelial erosions; DES dry eye syndrome; MGD meibomian gland dysfunction; ATs artificial tears; PFAT's preservative free artificial tears; [place]  nuclear sclerotic cataract; PSC posterior subcapsular cataract; ERM epi-retinal membrane; PVD posterior vitreous detachment; RD retinal detachment; DM diabetes mellitus; DR diabetic retinopathy; NPDR non-proliferative diabetic retinopathy; PDR proliferative diabetic retinopathy; CSME clinically significant macular edema; DME diabetic macular edema; dbh dot blot hemorrhages; CWS cotton wool spot; POAG primary open angle glaucoma; C/D cup-to-disc ratio; HVF humphrey visual field; GVF goldmann visual field; OCT optical coherence tomography; IOP intraocular pressure; BRVO Branch retinal vein occlusion; CRVO central retinal vein occlusion; CRAO central retinal artery occlusion; BRAO branch retinal artery occlusion; RT retinal tear; SB scleral buckle; PPV pars plana vitrectomy; VH Vitreous hemorrhage; PRP panretinal laser photocoagulation; IVK intravitreal kenalog; VMT vitreomacular traction; MH Macular hole;  NVD neovascularization of the disc; NVE neovascularization elsewhere; AREDS age related eye disease study; ARMD age related macular degeneration; POAG primary open angle glaucoma; EBMD epithelial/anterior basement membrane dystrophy; ACIOL anterior chamber intraocular lens; IOL intraocular lens; PCIOL posterior chamber intraocular lens; Phaco/IOL phacoemulsification with intraocular lens placement; [place] photorefractive keratectomy; LASIK laser assisted in situ keratomileusis; HTN hypertension; DM diabetes mellitus; COPD chronic obstructive pulmonary disease

## 2021-03-19 ENCOUNTER — Encounter (INDEPENDENT_AMBULATORY_CARE_PROVIDER_SITE_OTHER): Payer: Federal, State, Local not specified - PPO | Admitting: Ophthalmology

## 2021-03-26 ENCOUNTER — Other Ambulatory Visit: Payer: Self-pay

## 2021-03-26 ENCOUNTER — Ambulatory Visit (INDEPENDENT_AMBULATORY_CARE_PROVIDER_SITE_OTHER): Payer: Federal, State, Local not specified - PPO | Admitting: Ophthalmology

## 2021-03-26 ENCOUNTER — Encounter (INDEPENDENT_AMBULATORY_CARE_PROVIDER_SITE_OTHER): Payer: Self-pay

## 2021-03-26 ENCOUNTER — Encounter (INDEPENDENT_AMBULATORY_CARE_PROVIDER_SITE_OTHER): Payer: Self-pay | Admitting: Ophthalmology

## 2021-03-26 DIAGNOSIS — H2512 Age-related nuclear cataract, left eye: Secondary | ICD-10-CM | POA: Insufficient documentation

## 2021-03-26 DIAGNOSIS — H4312 Vitreous hemorrhage, left eye: Secondary | ICD-10-CM

## 2021-03-26 DIAGNOSIS — H33332 Multiple defects of retina without detachment, left eye: Secondary | ICD-10-CM | POA: Diagnosis not present

## 2021-03-26 DIAGNOSIS — H25042 Posterior subcapsular polar age-related cataract, left eye: Secondary | ICD-10-CM | POA: Diagnosis not present

## 2021-03-26 DIAGNOSIS — H25012 Cortical age-related cataract, left eye: Secondary | ICD-10-CM | POA: Diagnosis not present

## 2021-03-26 HISTORY — DX: Age-related nuclear cataract, left eye: H25.12

## 2021-03-26 NOTE — Assessment & Plan Note (Signed)
Is a retinopexy applied to these 3 locations atraumatically in mild discomfort only.  NAVILAS laser was used to close the flap tears at 9 and 12, and avulsed vessel was treated with diode green laser 3 mirror lens

## 2021-03-26 NOTE — Patient Instructions (Signed)
Patient to proceed to Dr. Tarri Glenn office now

## 2021-03-26 NOTE — Assessment & Plan Note (Signed)
Patient to proceed now to Dr. Merry Proud office for consideration and scheduling of surgery tomorrow in anticipation of planned vitrectomy low-lying scleral buckle next week

## 2021-03-26 NOTE — Assessment & Plan Note (Signed)
OS with increased vitreous hemorrhage documented clinically, symptomatically and on color fundus photography, we will proceed with careful again reexamination of the retinal periphery to look for retinal tear

## 2021-03-26 NOTE — Progress Notes (Signed)
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 Pt states vision seems to be worsening, notices it is darker and grayer than last visit. Denies FOL. Pt states "the floaters are the same, seeing a sheet of black dots, billions of them."      Last edited by Laurin Coder on 03/26/2021  8:18 AM.      Referring physician: [name], [name], DO [address] [place],  [place] [address]  HISTORICAL INFORMATION:   Selected notes from the MEDICAL RECORD NUMBER    Lab Results  Component Value Date   HGBA1C 6.1 07/12/2020     CURRENT MEDICATIONS: No current outpatient medications on file. (Ophthalmic Drugs)   No current facility-administered medications for this visit. (Ophthalmic Drugs)   Current Outpatient Medications (Other)  Medication Sig   albuterol (VENTOLIN HFA) 108 (90 Base) MCG/ACT inhaler INHALE 2 PUFFS INTO THE LUNGS EVERY 6 HOURS AS NEEDED FOR WHEEZING OR SHORTNESS OF BREATH   ALBUTEROL IN Inhale into the lungs as needed.   Albuterol Sulfate 2.5 MG/0.5ML NEBU Inhale into the lungs. 1 nebule every 6 hours as needed   allopurinol (ZYLOPRIM) 300 MG tablet Take 1 tablet (300 mg total) by mouth daily.   atorvastatin (LIPITOR) 40 MG tablet TAKE 1 TABLET EVERY MORNING   azithromycin (ZITHROMAX Z-PAK) 250 MG tablet As directed   Coenzyme Q10 (CO Q 10 PO) Take 1 tablet by mouth daily.   fluticasone furoate-vilanterol (BREO ELLIPTA) 100-25 MCG/INH AEPB USE 1 INHALATION ORALLY    DAILY   nebivolol (BYSTOLIC) 10 MG tablet TAKE 1 TABLET DAILY   olmesartan-hydrochlorothiazide (BENICAR HCT)  40-12.5 MG tablet TAKE 1 TABLET DAILY   omeprazole (PRILOSEC) 40 MG capsule TAKE 1 CAPSULE DAILY   predniSONE (DELTASONE) 10 MG tablet TAKE 3 TABLETS PO QD FOR 3 DAYS THEN TAKE 2 TABLETS PO QD FOR 3 DAYS THEN TAKE 1 TABLET PO QD FOR 3 DAYS THEN TAKE 1/2 TAB PO QD FOR 3 DAYS   silodosin (RAPAFLO) 8 MG CAPS capsule Take 8 mg by mouth at bedtime.   solifenacin (VESICARE) 5 MG tablet Take 1 tablet (5 mg total) by mouth daily.   VITAMIN D PO Take 4,000 Lipoprotein Lipase Releasing Units by mouth daily.   No current facility-administered medications for this visit. (Other)      REVIEW OF SYSTEMS: ROS   Negative for: Constitutional, Gastrointestinal, Neurological, Skin, Genitourinary, Musculoskeletal, HENT, Endocrine, Cardiovascular, Eyes, Respiratory, Psychiatric, Allergic/Imm, Heme/Lymph Last edited by Hurman Horn, MD on 03/26/2021 10:37 AM.       ALLERGIES Allergies  Allergen Reactions   Merthiolate [Thimerosal] Rash    Rash--local    PAST MEDICAL HISTORY Past Medical History:  Diagnosis Date   BPH (benign prostatic hypertrophy)    GERD (gastroesophageal reflux disease)    H/O hiatal hernia    History of colon polyps    Hyperlipidemia    Hypertension  Knee internal derangement    RIGHT   Macular pucker, right eye 07/05/2019   Vitrectomy, removal of silicone oil, membrane peel, repair complex retinal detachment inferonasal right eye  11-30-19   Mild asthma    Vitamin D deficiency    Wears glasses    Past Surgical History:  Procedure Laterality Date   COLONOSCOPY W/ POLYPECTOMY  01/21/2008   ESOPHAGOGASTRODUODENOSCOPY  11/15/2008   EYE SURGERY Right    x6   [facility]   KNEE ARTHROSCOPY Right X4  last one 2005   KNEE ARTHROSCOPY Right 12/06/2013   Procedure: ARTHROSCOPY RIGHT KNEE WITH DEBRIDMENT AND PARTIAL MEDIAL AND LATERAL MENISCECTOMY AND CHONDRLPLASTY;  Surgeon: [name], MD;  Location: [facility];  Service: Orthopedics;  Laterality: Right;    LUMBAR DISC SURGERY  04/02/2007   L5 -- S1   NEGATIVE SLEEP STUDY  per pt   PARATHYROIDECTOMY  02/04/1987   removal adenoma    FAMILY HISTORY Family History  Problem Relation Age of Onset   Hyperlipidemia Mother    Coronary artery disease Mother    Pulmonary embolism Father    Colon cancer Father    Hyperlipidemia Brother    Pancreatic cancer Brother     SOCIAL HISTORY Social History   Tobacco Use   Smoking status: Never   Smokeless tobacco: Never  Substance Use Topics   Alcohol use: Yes    Alcohol/week: 0.0 standard drinks    Comment: social use   Drug use: No         OPHTHALMIC EXAM:  Base Eye Exam     Visual Acuity (ETDRS)       Right Left   Dist cc 20/160 -1 20/20   Dist ph cc 20/125 -2     Correction: Glasses         Tonometry (Tonopen, 8:21 AM)       Right Left   Pressure 16 14         Pupils       Pupils Dark Light APD   Right PERRL 3 2 None   Left PERRL 3 2 None         Extraocular Movement       Right Left    Full Full         Neuro/Psych     Oriented x3: Yes   Mood/Affect: Normal         Dilation     Left eye: 1.0% Mydriacyl, 2.5% Phenylephrine @ 8:22 AM           Slit Lamp and Fundus Exam     External Exam       Right Left   External Normal Normal         Slit Lamp Exam       Right Left   Lids/Lashes Normal Normal   Conjunctiva/Sclera White and quiet White and quiet   Cornea Clear Clear   Anterior Chamber Deep and quiet Deep and quiet   Iris Round and reactive Round and reactive   Lens Posterior chamber intraocular lens, 1+ Posterior capsular opacification 1+ Nuclear sclerosis   Anterior Vitreous Normal Normal         Fundus Exam       Right Left   Posterior Vitreous  Normal,   Disc  Normal   C/D Ratio  0.2   Macula  Normal   Vessels  Normal   Periphery  360 scleral depressed exam, 25D lens, small flap tear at 830 denoted  by 2 intraretinal hemorrhages, 9 15-9 30 meridian and avulsed  vein transected but with no visible tear with the traction on the vein elevated into the vitreous, Third area of involvement is at 12:00, flap tear            IMAGING AND PROCEDURES  Imaging and Procedures for 03/26/21  OCT, Retina - OU - Both Eyes       Right Eye Quality was good. Scan locations included subfoveal. Central Foveal Thickness: 275. Progression has improved. Findings include abnormal foveal contour, outer retinal atrophy, central retinal atrophy.   Left Eye Quality was good. Scan locations included subfoveal. Central Foveal Thickness: 290. Progression has been stable. Findings include normal foveal contour.   Notes Much less topographic distortion, some foveal atrophy exist centrally OD no epiretinal membrane in the macular region for well two disc diameters radius   No epiretinal tissue preproliferation.  Outer retinal photoreceptor layer irregular in the foveal region yet still recognizable OS normal contour  OS with vitreous debris     Color Fundus Photography Optos - OU - Both Eyes       Right Eye Progression has been stable. Disc findings include pallor.   Left Eye Progression has worsened.   Notes OS increased vitreous hemorrhage    OD unchanged     B-Scan Ultrasound - OS - Left Eye       Quality was good. Findings included vitreous hemorrhage.   Notes No obvious retinal tear, suspicious area at 7:30 position could be reflection of PVD with recent hemorrhage located or possible small tear or other suspicious area noted at 2 o'clock position     Repair Retinal Breaks, Laser - OS - Left Eye       Tear locations include superior, nasal.   Time Out Confirmed correct patient, procedure, site, and patient consented.   Anesthesia Topical anesthesia was used. Anesthetic medications included Proparacaine 0.5%.   Laser Information The type of laser was diode. Color was yellow. The duration in seconds was 0.03. The spot size was 390  microns. Laser power was 240. Total spots was 211.   Post-op The patient tolerated the procedure well. There were no complications. The patient received written and verbal post procedure care education.   Notes Flap tears at 12 and 9:00 successfully surrounded with excellent laser reaction.  Avulsed retinal vessel with the retina with small tear around it at the 8:30 position could not be visualized with the PRP with a wide-field lens on the NAVILAS laser and thus the patient was moved to the diode 532 green laser whereby 3 m laser was used to deliver laser retinopexy successfully inferonasally 30 position             ASSESSMENT/PLAN:  Retinal tears, multiple, without detachment, left Is a retinopexy applied to these 3 locations atraumatically in mild discomfort only.  NAVILAS laser was used to close the flap tears at 9 and 12, and avulsed vessel was treated with diode green laser 3 mirror lens  Nuclear sclerotic cataract of left eye Patient to proceed now to Dr. Merry Proud office for consideration and scheduling of surgery tomorrow in anticipation of planned vitrectomy low-lying scleral buckle next week  Vitreous hemorrhage, left eye ([place]) OS with increased vitreous hemorrhage documented clinically, symptomatically and on color fundus photography, we will proceed with careful again reexamination of the retinal periphery to look for retinal tear     ICD-10-CM   1. Retinal tears, multiple, without detachment, left  H33.332  Repair Retinal Breaks, Laser - OS - Left Eye    2. Vitreous hemorrhage, left eye ([facility])  H43.12 OCT, Retina - OU - Both Eyes    Color Fundus Photography Optos - OU - Both Eyes    B-Scan Ultrasound - OS - Left Eye    3. Nuclear sclerotic cataract of left eye  H25.12       1.  Lengthy discussion undertaken left eye regarding the vitreous hemorrhage now increasing, careful examination did not find new retinal tears today.  Explained the increased hemorrhage and  close the patient experienced increasing over the last week.  Retinopexy applied.  2.  Risk of PVR given the concomitant history in the fellow eye although slightly less due to these are smaller breaks.  3.  Proceed with cataract surgery evaluation today for medical purposes with Dr. Audry Pili.  Dr. B was also contacted.  Cataract surgery with lens implantation will allow [place] ultimately for vitrectomy and removal of the ER inducing vitreous hemorrhage which is present and poses a high risk for developing retinal detachment in this left eye.  Ophthalmic Meds Ordered this visit:  No orders of the defined types were placed in this encounter.      Return General anesthesia, [facility], for Schedule vitrectomy with low-lying scleral buckle OS, OS.  Patient Instructions  Patient to proceed to Dr. Tarri Glenn office now   Explained the diagnoses, plan, and follow up with the patient and they expressed understanding.  Patient expressed understanding of the importance of proper follow up care.   [name] M.D. Diseases & Surgery of the Retina and Vitreous Retina & Diabetic [place] 03/26/21     Abbreviations: M myopia (nearsighted); A astigmatism; H hyperopia (farsighted); P presbyopia; Mrx spectacle prescription;  CTL contact lenses; OD right eye; OS left eye; OU both eyes  XT exotropia; ET esotropia; PEK punctate epithelial keratitis; PEE punctate epithelial erosions; DES dry eye syndrome; MGD meibomian gland dysfunction; ATs artificial tears; PFAT's preservative free artificial tears; [place] nuclear sclerotic cataract; PSC posterior subcapsular cataract; ERM epi-retinal membrane; PVD posterior vitreous detachment; RD retinal detachment; DM diabetes mellitus; DR diabetic retinopathy; NPDR non-proliferative diabetic retinopathy; PDR proliferative diabetic retinopathy; CSME clinically significant macular edema; DME diabetic macular edema; dbh dot blot hemorrhages; CWS cotton wool spot;  POAG primary open angle glaucoma; C/D cup-to-disc ratio; HVF humphrey visual field; GVF goldmann visual field; OCT optical coherence tomography; IOP intraocular pressure; BRVO Branch retinal vein occlusion; CRVO central retinal vein occlusion; CRAO central retinal artery occlusion; BRAO branch retinal artery occlusion; RT retinal tear; SB scleral buckle; PPV pars plana vitrectomy; VH Vitreous hemorrhage; PRP panretinal laser photocoagulation; IVK intravitreal kenalog; VMT vitreomacular traction; MH Macular hole;  NVD neovascularization of the disc; NVE neovascularization elsewhere; AREDS age related eye disease study; ARMD age related macular degeneration; POAG primary open angle glaucoma; EBMD epithelial/anterior basement membrane dystrophy; ACIOL anterior chamber intraocular lens; IOL intraocular lens; PCIOL posterior chamber intraocular lens; Phaco/IOL phacoemulsification with intraocular lens placement; [place] photorefractive keratectomy; LASIK laser assisted in situ keratomileusis; HTN hypertension; DM diabetes mellitus; COPD chronic obstructive pulmonary disease

## 2021-03-27 DIAGNOSIS — H2512 Age-related nuclear cataract, left eye: Secondary | ICD-10-CM | POA: Diagnosis not present

## 2021-03-28 ENCOUNTER — Encounter (INDEPENDENT_AMBULATORY_CARE_PROVIDER_SITE_OTHER): Payer: Self-pay

## 2021-04-01 ENCOUNTER — Encounter (INDEPENDENT_AMBULATORY_CARE_PROVIDER_SITE_OTHER): Payer: Self-pay | Admitting: Ophthalmology

## 2021-04-01 ENCOUNTER — Ambulatory Visit (INDEPENDENT_AMBULATORY_CARE_PROVIDER_SITE_OTHER): Payer: Federal, State, Local not specified - PPO | Admitting: Ophthalmology

## 2021-04-01 ENCOUNTER — Other Ambulatory Visit: Payer: Self-pay

## 2021-04-01 DIAGNOSIS — H4312 Vitreous hemorrhage, left eye: Secondary | ICD-10-CM | POA: Diagnosis not present

## 2021-04-01 MED ORDER — PREDNISOLONE ACETATE 1 % OP SUSP: 1.0000 [drp] | Freq: Four times a day (QID) | OPHTHALMIC | 0 refills | Status: AC

## 2021-04-01 MED ORDER — OFLOXACIN 0.3 % OP SOLN: 1.0000 [drp] | Freq: Four times a day (QID) | OPHTHALMIC | 0 refills | Status: AC

## 2021-04-01 NOTE — Patient Instructions (Signed)
Patient to continue on topical ketorolac left eye twice daily  Patient to continue on Besivance 3 times daily left eye  Patient continue on Pred forte 4 times daily left eye  N.p.o. after midnight on 04-02-2021

## 2021-04-01 NOTE — Assessment & Plan Note (Signed)
OS, will need B-scan ultrasonography again today to confirm that the recent slight change in vision last 24 hours is not associated with retinal tear or detachment today

## 2021-04-01 NOTE — Progress Notes (Signed)
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 Pt had a lens implant, cataract surgery in the left eye Wednesday 03/27/21 by Dr. Talbert Forest. Pt states he "cannot see anything within 8 feet since the surgery," so he is using readers for right now to help, left eye only.  Out of focus vision due to planned slight hyperopia discussed with Dr. Talbert Forest postoperatively day of surgery, at the surgical center, this anticipation of low-grade, shallow, scleral buckle combined with vitrectomy  Pt is currently using Besivance OS TID, prednisolone QID OS, Ketorolac QID OS, per pt wife. Last edited by Hurman Horn, MD on 04/01/2021  1:33 PM.      Referring physician: [name], [name], DO [address] [place],  [place] [address]  HISTORICAL INFORMATION:   Selected notes from the MEDICAL RECORD NUMBER    Lab Results  Component Value Date   HGBA1C 6.1 07/12/2020     CURRENT MEDICATIONS: Current Outpatient Medications (Ophthalmic Drugs)  Medication Sig   ofloxacin (OCUFLOX) 0.3 % ophthalmic solution Place 1 drop into the left eye in the morning, at noon, in the evening, and at bedtime for 21 days.   prednisoLONE acetate (PRED FORTE) 1 % ophthalmic suspension Place 1 drop into the left eye 4 (four) times daily for 21 doses.   No current facility-administered medications for this visit. (Ophthalmic Drugs)   Current Outpatient Medications (Other)  Medication Sig   albuterol (VENTOLIN HFA) 108 (90 Base) MCG/ACT inhaler INHALE 2 PUFFS INTO THE LUNGS EVERY 6 HOURS AS NEEDED FOR WHEEZING OR SHORTNESS OF BREATH   ALBUTEROL IN Inhale into the lungs as needed.   Albuterol Sulfate 2.5 MG/0.5ML NEBU Inhale into the lungs. 1 nebule every 6 hours  as needed   allopurinol (ZYLOPRIM) 300 MG tablet Take 1 tablet (300 mg total) by mouth daily.   atorvastatin (LIPITOR) 40 MG tablet TAKE 1 TABLET EVERY MORNING   azithromycin (ZITHROMAX Z-PAK) 250 MG tablet As directed   Coenzyme Q10 (CO Q 10 PO) Take 1 tablet by mouth daily.   fluticasone furoate-vilanterol (BREO ELLIPTA) 100-25 MCG/INH AEPB USE 1 INHALATION ORALLY    DAILY   nebivolol (BYSTOLIC) 10 MG tablet TAKE 1 TABLET DAILY   olmesartan-hydrochlorothiazide (BENICAR HCT) 40-12.5 MG tablet TAKE 1 TABLET DAILY   omeprazole (PRILOSEC) 40 MG capsule TAKE 1 CAPSULE DAILY   predniSONE (DELTASONE) 10 MG tablet TAKE 3 TABLETS PO QD FOR 3 DAYS THEN TAKE 2 TABLETS PO QD FOR 3 DAYS THEN TAKE 1 TABLET PO QD FOR 3 DAYS THEN TAKE 1/2 TAB PO QD FOR 3 DAYS   silodosin (RAPAFLO) 8 MG CAPS capsule Take 8 mg by mouth at bedtime.   solifenacin (VESICARE) 5 MG tablet Take 1 tablet (5 mg total) by mouth daily.   VITAMIN D PO Take 4,000 Lipoprotein Lipase Releasing Units by mouth daily.   No current facility-administered medications for this visit. (Other)      REVIEW OF SYSTEMS: ROS   Negative for: Constitutional, Gastrointestinal, Neurological, Skin, Genitourinary, Musculoskeletal, HENT, Endocrine, Cardiovascular, Eyes, Respiratory, Psychiatric, Allergic/Imm, Heme/Lymph Last edited by Hurman Horn, MD on 04/01/2021  1:31 PM.  ALLERGIES Allergies  Allergen Reactions   Merthiolate [Thimerosal] Rash    Rash--local    PAST MEDICAL HISTORY Past Medical History:  Diagnosis Date   BPH (benign prostatic hypertrophy)    GERD (gastroesophageal reflux disease)    H/O hiatal hernia    History of colon polyps    Hyperlipidemia    Hypertension    Knee internal derangement    RIGHT   Macular pucker, right eye 07/05/2019   Vitrectomy, removal of silicone oil, membrane peel, repair complex retinal detachment inferonasal right eye  11-30-19   Mild asthma    Vitamin D deficiency    Wears glasses     Past Surgical History:  Procedure Laterality Date   COLONOSCOPY W/ POLYPECTOMY  01/21/2008   ESOPHAGOGASTRODUODENOSCOPY  11/15/2008   EYE SURGERY Right    x6   [facility]   KNEE ARTHROSCOPY Right X4  last one 2005   KNEE ARTHROSCOPY Right 12/06/2013   Procedure: ARTHROSCOPY RIGHT KNEE WITH DEBRIDMENT AND PARTIAL MEDIAL AND LATERAL MENISCECTOMY AND CHONDRLPLASTY;  Surgeon: [name], MD;  Location: [facility];  Service: Orthopedics;  Laterality: Right;   LUMBAR DISC SURGERY  04/02/2007   L5 -- S1   NEGATIVE SLEEP STUDY  per pt   PARATHYROIDECTOMY  02/04/1987   removal adenoma    FAMILY HISTORY Family History  Problem Relation Age of Onset   Hyperlipidemia Mother    Coronary artery disease Mother    Pulmonary embolism Father    Colon cancer Father    Hyperlipidemia Brother    Pancreatic cancer Brother     SOCIAL HISTORY Social History   Tobacco Use   Smoking status: Never   Smokeless tobacco: Never  Substance Use Topics   Alcohol use: Yes    Alcohol/week: 0.0 standard drinks    Comment: social use   Drug use: No         OPHTHALMIC EXAM:  Base Eye Exam     Visual Acuity (ETDRS)       Right Left   Dist [place] 20/125 -2 20/40 +2   Dist ph [place] 20/125 +1 20/25         Tonometry (Tonopen, 1:02 PM)       Right Left   Pressure 15 13         Pupils       Pupils Dark Light APD   Right PERRL 2 2 None   Left PERRL 2 2 None         Extraocular Movement       Right Left    Full Full         Neuro/Psych     Oriented x3: Yes   Mood/Affect: Normal         Dilation     Both eyes: No dilation Preop @ 1:02 PM           Slit Lamp and Fundus Exam     External Exam       Right Left   External Normal Normal         Slit Lamp Exam       Right Left   Lids/Lashes Normal Normal   Conjunctiva/Sclera White and quiet White and quiet   Cornea Clear Clear   Anterior Chamber Deep and quiet Deep and quiet   Iris Round and  reactive Round and reactive   Lens Posterior chamber intraocular lens, 1+ Posterior capsular opacification Centered posterior chamber intraocular lens   Anterior Vitreous Normal Normal  IMAGING AND PROCEDURES  Imaging and Procedures for 04/01/21  B-Scan Ultrasound - OS - Left Eye       Quality was good. Findings included vitreous hemorrhage.   Notes No obvious retinal tear, suspicious area at 7:30 position could be reflection of PVD with recent hemorrhage located or possible small tear or other suspicious area noted at 2 o'clock position,, still no retinal tears imaged certainly no retinal detachment present             ASSESSMENT/PLAN:  Vitreous hemorrhage, left eye (HCC) OS, will need B-scan ultrasonography again today to confirm that the recent slight change in vision last 24 hours is not associated with retinal tear or detachment today     ICD-10-CM   1. Vitreous hemorrhage, left eye (HCC)  H43.12 B-Scan Ultrasound - OS - Left Eye      1.  2.  3.  Ophthalmic Meds Ordered this visit:  Meds ordered this encounter  Medications   prednisoLONE acetate (PRED FORTE) 1 % ophthalmic suspension    Sig: Place 1 drop into the left eye 4 (four) times daily for 21 doses.    Dispense:  5 mL    Refill:  0   ofloxacin (OCUFLOX) 0.3 % ophthalmic solution    Sig: Place 1 drop into the left eye in the morning, at noon, in the evening, and at bedtime for 21 days.    Dispense:  5 mL    Refill:  0       Return ,, [facility], for As scheduled, vitrectomy, low-lying scleral buckle left eye, 04-03-2021.  Patient Instructions  Patient to continue on topical ketorolac left eye twice daily  Patient to continue on Besivance 3 times daily left eye  Patient continue on Pred forte 4 times daily left eye  N.p.o. after midnight on 04-02-2021   Explained the diagnoses, plan, and follow up with the patient and they expressed understanding.  Patient  expressed understanding of the importance of proper follow up care.   [name] M.D. Diseases & Surgery of the Retina and Vitreous Retina & Diabetic [place] 04/01/21     Abbreviations: M myopia (nearsighted); A astigmatism; H hyperopia (farsighted); P presbyopia; Mrx spectacle prescription;  CTL contact lenses; OD right eye; OS left eye; OU both eyes  XT exotropia; ET esotropia; PEK punctate epithelial keratitis; PEE punctate epithelial erosions; DES dry eye syndrome; MGD meibomian gland dysfunction; ATs artificial tears; PFAT's preservative free artificial tears; [place] nuclear sclerotic cataract; PSC posterior subcapsular cataract; ERM epi-retinal membrane; PVD posterior vitreous detachment; RD retinal detachment; DM diabetes mellitus; DR diabetic retinopathy; NPDR non-proliferative diabetic retinopathy; PDR proliferative diabetic retinopathy; CSME clinically significant macular edema; DME diabetic macular edema; dbh dot blot hemorrhages; CWS cotton wool spot; POAG primary open angle glaucoma; C/D cup-to-disc ratio; HVF humphrey visual field; GVF goldmann visual field; OCT optical coherence tomography; IOP intraocular pressure; BRVO Branch retinal vein occlusion; CRVO central retinal vein occlusion; CRAO central retinal artery occlusion; BRAO branch retinal artery occlusion; RT retinal tear; SB scleral buckle; PPV pars plana vitrectomy; VH Vitreous hemorrhage; PRP panretinal laser photocoagulation; IVK intravitreal kenalog; VMT vitreomacular traction; MH Macular hole;  NVD neovascularization of the disc; NVE neovascularization elsewhere; AREDS age related eye disease study; ARMD age related macular degeneration; POAG primary open angle glaucoma; EBMD epithelial/anterior basement membrane dystrophy; ACIOL anterior chamber intraocular lens; IOL intraocular lens; PCIOL posterior chamber intraocular lens; Phaco/IOL phacoemulsification with intraocular lens placement; PRK photorefractive keratectomy;  LASIK laser assisted  in situ keratomileusis; HTN hypertension; DM diabetes mellitus; COPD chronic obstructive pulmonary disease

## 2021-04-02 ENCOUNTER — Encounter (INDEPENDENT_AMBULATORY_CARE_PROVIDER_SITE_OTHER): Payer: Self-pay

## 2021-04-03 DIAGNOSIS — H33332 Multiple defects of retina without detachment, left eye: Secondary | ICD-10-CM

## 2021-04-03 DIAGNOSIS — H4312 Vitreous hemorrhage, left eye: Secondary | ICD-10-CM | POA: Diagnosis not present

## 2021-04-04 ENCOUNTER — Encounter (INDEPENDENT_AMBULATORY_CARE_PROVIDER_SITE_OTHER): Payer: Self-pay | Admitting: Ophthalmology

## 2021-04-04 ENCOUNTER — Ambulatory Visit (INDEPENDENT_AMBULATORY_CARE_PROVIDER_SITE_OTHER): Payer: Federal, State, Local not specified - PPO | Admitting: Ophthalmology

## 2021-04-04 ENCOUNTER — Other Ambulatory Visit: Payer: Self-pay

## 2021-04-04 DIAGNOSIS — H4312 Vitreous hemorrhage, left eye: Secondary | ICD-10-CM

## 2021-04-04 DIAGNOSIS — H33332 Multiple defects of retina without detachment, left eye: Secondary | ICD-10-CM

## 2021-04-04 NOTE — Progress Notes (Signed)
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  Negative for pain   Vision: is worse and is blurred at distance         Comments   1 day PO OS sx 04/03/2021, vitrectomy, low-lying scleral buckle left eye. Pt states "vision is blurred and not as good as my last visit, but I don't have all the floaters I had before. I took the patch off this morning because it was soaked, my eye has been running since the surgery. It is itchy." Pt states he is going to start using the drops again today as directed: ketorolac BID OS, Besivance (told to finish using) TID OS, prednisolone QID OS.      Last edited by Laurin Coder on 04/04/2021  8:58 AM.      Referring physician: [name], [name], DO [address] [place],  [place] [id number]  HISTORICAL INFORMATION:   Selected notes from the MEDICAL RECORD NUMBER    Lab Results  Component Value Date   HGBA1C 6.1 07/12/2020     CURRENT MEDICATIONS: Current Outpatient Medications (Ophthalmic Drugs)  Medication Sig   ofloxacin (OCUFLOX) 0.3 % ophthalmic solution Place 1 drop into the left eye in the morning, at noon, in the evening, and at bedtime for 21 days.   prednisoLONE acetate (PRED FORTE) 1 % ophthalmic suspension Place 1 drop into the left eye 4 (four) times daily for 21 doses.   No current facility-administered medications for this visit. (Ophthalmic Drugs)   Current Outpatient Medications (Other)  Medication Sig   albuterol (VENTOLIN HFA) 108 (90 Base) MCG/ACT inhaler INHALE 2 PUFFS INTO THE LUNGS EVERY 6 HOURS AS NEEDED FOR WHEEZING OR SHORTNESS OF BREATH   ALBUTEROL IN Inhale into the lungs as needed.   Albuterol Sulfate 2.5 MG/0.5ML  NEBU Inhale into the lungs. 1 nebule every 6 hours as needed   allopurinol (ZYLOPRIM) 300 MG tablet Take 1 tablet (300 mg total) by mouth daily.   atorvastatin (LIPITOR) 40 MG tablet TAKE 1 TABLET EVERY MORNING   azithromycin (ZITHROMAX Z-PAK) 250 MG tablet As directed   Coenzyme Q10 (CO Q 10 PO) Take 1 tablet by mouth daily.   fluticasone furoate-vilanterol (BREO ELLIPTA) 100-25 MCG/INH AEPB USE 1 INHALATION ORALLY    DAILY   nebivolol (BYSTOLIC) 10 MG tablet TAKE 1 TABLET DAILY   olmesartan-hydrochlorothiazide (BENICAR HCT) 40-12.5 MG tablet TAKE 1 TABLET DAILY   omeprazole (PRILOSEC) 40 MG capsule TAKE 1 CAPSULE DAILY   predniSONE (DELTASONE) 10 MG tablet TAKE 3 TABLETS PO QD FOR 3 DAYS THEN TAKE 2 TABLETS PO QD FOR 3 DAYS THEN TAKE 1 TABLET PO QD FOR 3 DAYS THEN TAKE 1/2 TAB PO QD FOR 3 DAYS   silodosin (RAPAFLO) 8 MG CAPS capsule Take 8 mg by mouth at bedtime.   solifenacin (VESICARE) 5 MG tablet Take 1 tablet (5 mg total) by mouth daily.   VITAMIN D PO Take 4,000 Lipoprotein Lipase Releasing Units by mouth daily.   No current facility-administered medications for this visit. (Other)      REVIEW OF SYSTEMS: ROS  Negative for: Constitutional, Gastrointestinal, Neurological, Skin, Genitourinary, Musculoskeletal, HENT, Endocrine, Cardiovascular, Eyes, Respiratory, Psychiatric, Allergic/Imm, Heme/Lymph Last edited by Hurman Horn, MD on 04/04/2021  9:36 AM.       ALLERGIES Allergies  Allergen Reactions   Merthiolate [Thimerosal] Rash    Rash--local    PAST MEDICAL HISTORY Past Medical History:  Diagnosis Date   BPH (benign prostatic hypertrophy)    GERD (gastroesophageal reflux disease)    H/O hiatal hernia    History of colon polyps    Hyperlipidemia    Hypertension    Knee internal derangement    RIGHT   Macular pucker, right eye 07/05/2019   Vitrectomy, removal of silicone oil, membrane peel, repair complex retinal detachment inferonasal right eye  11-30-19   Mild  asthma    Vitamin D deficiency    Wears glasses    Past Surgical History:  Procedure Laterality Date   COLONOSCOPY W/ POLYPECTOMY  01/21/2008   ESOPHAGOGASTRODUODENOSCOPY  11/15/2008   EYE SURGERY Right    x6   [facility]   KNEE ARTHROSCOPY Right X4  last one 2005   KNEE ARTHROSCOPY Right 12/06/2013   Procedure: ARTHROSCOPY RIGHT KNEE WITH DEBRIDMENT AND PARTIAL MEDIAL AND LATERAL MENISCECTOMY AND CHONDRLPLASTY;  Surgeon: [name], MD;  Location: [facility];  Service: Orthopedics;  Laterality: Right;   LUMBAR DISC SURGERY  04/02/2007   L5 -- S1   NEGATIVE SLEEP STUDY  per pt   PARATHYROIDECTOMY  02/04/1987   removal adenoma    FAMILY HISTORY Family History  Problem Relation Age of Onset   Hyperlipidemia Mother    Coronary artery disease Mother    Pulmonary embolism Father    Colon cancer Father    Hyperlipidemia Brother    Pancreatic cancer Brother     SOCIAL HISTORY Social History   Tobacco Use   Smoking status: Never   Smokeless tobacco: Never  Substance Use Topics   Alcohol use: Yes    Alcohol/week: 0.0 standard drinks    Comment: social use   Drug use: No         OPHTHALMIC EXAM:  Base Eye Exam     Visual Acuity (ETDRS)       Right Left   Dist [place] 20/100 -2 20/30   Dist ph [place] 20/100 +1          Tonometry (Tonopen, 9:00 AM)       Right Left   Pressure 16 13         Pupils       Dark Light APD   Right 3 2 None   Left [employer]. Dilated  None         Extraocular Movement       Right Left    Full Full         Neuro/Psych     Oriented x3: Yes   Mood/Affect: Normal         Dilation     Left eye: 1.0% Mydriacyl, 2.5% Phenylephrine @ 9:00 AM           Slit Lamp and Fundus Exam     External Exam       Right Left   External Normal Normal         Slit Lamp Exam       Right Left   Lids/Lashes Normal Normal   Conjunctiva/Sclera  3+ Injection, well closed,   Cornea Clear Clear   Anterior Chamber  Deep and quiet Deep and quiet  Iris Round and reactive Round and reactive   Lens Posterior chamber intraocular lens, 1+ Posterior capsular opacification Centered posterior chamber intraocular lens   Anterior Vitreous Normal Normal         Fundus Exam       Right Left   Posterior Vitreous  Clear, vitrectomized   Disc  Normal   C/D Ratio  0.2   Macula  Normal   Vessels  Normal   Periphery  Good laser retinopexy nasal, good cryopexy superotemporal centered at 130 horseshoe tear and 2:00 and 12:00 atrophy            IMAGING AND PROCEDURES  Imaging and Procedures for 04/04/21           ASSESSMENT/PLAN:  Retinal tears, multiple, without detachment, left Multiple retinal breaks, new ones  when found at time of surgery, superotemporal 12, 130, 2  Surgical repair 04-03-2021 via cryopexy, scleral buckle, vitrectomy  Vitreous hemorrhage, left eye (HCC) All vitreous hemorrhage cleared      ICD-10-CM   1. Retinal tears, multiple, without detachment, left  H33.332     2. Vitreous hemorrhage, left eye (HCC)  H43.12       1.  Patient may resume all use of Tylenol 500 mg p.o. every 8 to 12 hours as needed, recommend for the next 3 days use without change  Additionally Advil or ibuprofen can be used 200 mg 2 or 3 tablets every 8 hours as needed  2.  Patient to resume topical medications, no intense activity allowed for the next 10 days.  3.  Ophthalmic Meds Ordered this visit:  No orders of the defined types were placed in this encounter.      Return in about 1 week (around 04/11/2021) for dilate, OS, COLOR FP, OCT.  Patient Instructions   Besivance 3 times daily to the operative eye  Prednisolone acetate 1 drop to the operative eye 4 times daily  Ketorolac 1 drop left eye twice daily  Patient instructed not to refill the medications and use them for maximum of 3 weeks.  Patient instructed do not rub the eye.  Patient has the option to use the patch at night.     Explained the diagnoses, plan, and follow up with the patient and they expressed understanding.  Patient expressed understanding of the importance of proper follow up care.   Clent Demark Holland Kotter M.D. Diseases & Surgery of the Retina and Vitreous Retina & Diabetic [place] 04/04/21     Abbreviations: M myopia (nearsighted); A astigmatism; H hyperopia (farsighted); P presbyopia; Mrx spectacle prescription;  CTL contact lenses; OD right eye; OS left eye; OU both eyes  XT exotropia; ET esotropia; PEK punctate epithelial keratitis; PEE punctate epithelial erosions; DES dry eye syndrome; MGD meibomian gland dysfunction; ATs artificial tears; PFAT's preservative free artificial tears; [place] nuclear sclerotic cataract; PSC posterior subcapsular cataract; ERM epi-retinal membrane; PVD posterior vitreous detachment; RD retinal detachment; DM diabetes mellitus; DR diabetic retinopathy; NPDR non-proliferative diabetic retinopathy; PDR proliferative diabetic retinopathy; CSME clinically significant macular edema; DME diabetic macular edema; dbh dot blot hemorrhages; CWS cotton wool spot; POAG primary open angle glaucoma; C/D cup-to-disc ratio; HVF humphrey visual field; GVF goldmann visual field; OCT optical coherence tomography; IOP intraocular pressure; BRVO Branch retinal vein occlusion; CRVO central retinal vein occlusion; CRAO central retinal artery occlusion; BRAO branch retinal artery occlusion; RT retinal tear; SB scleral buckle; PPV pars plana vitrectomy; VH Vitreous hemorrhage; PRP panretinal laser photocoagulation; IVK intravitreal kenalog; VMT vitreomacular traction; MH  Macular hole;  NVD neovascularization of the disc; NVE neovascularization elsewhere; AREDS age related eye disease study; ARMD age related macular degeneration; POAG primary open angle glaucoma; EBMD epithelial/anterior basement membrane dystrophy; ACIOL anterior chamber intraocular lens; IOL intraocular lens; PCIOL posterior chamber  intraocular lens; Phaco/IOL phacoemulsification with intraocular lens placement; [place] photorefractive keratectomy; LASIK laser assisted in situ keratomileusis; HTN hypertension; DM diabetes mellitus; COPD chronic obstructive pulmonary disease

## 2021-04-04 NOTE — Assessment & Plan Note (Addendum)
Multiple retinal breaks, new ones  when found at time of surgery, superotemporal 12, 130, 2 ? ?Surgical repair 04-03-2021 via cryopexy, scleral buckle, vitrectomy ?

## 2021-04-04 NOTE — Assessment & Plan Note (Signed)
All vitreous hemorrhage cleared ?

## 2021-04-04 NOTE — Patient Instructions (Signed)
Besivance 3 times daily to the operative eye ? ?Prednisolone acetate 1 drop to the operative eye 4 times daily ? ?Ketorolac 1 drop left eye twice daily ? ?Patient instructed not to refill the medications and use them for maximum of 3 weeks. ? ?Patient instructed do not rub the eye.  Patient has the option to use the patch at night.  ?

## 2021-04-10 ENCOUNTER — Ambulatory Visit (INDEPENDENT_AMBULATORY_CARE_PROVIDER_SITE_OTHER): Payer: Federal, State, Local not specified - PPO | Admitting: Ophthalmology

## 2021-04-10 ENCOUNTER — Other Ambulatory Visit: Payer: Self-pay

## 2021-04-10 ENCOUNTER — Encounter (INDEPENDENT_AMBULATORY_CARE_PROVIDER_SITE_OTHER): Payer: Self-pay | Admitting: Ophthalmology

## 2021-04-10 DIAGNOSIS — H33332 Multiple defects of retina without detachment, left eye: Secondary | ICD-10-CM

## 2021-04-10 NOTE — Patient Instructions (Signed)
Patient instructed to use Besivance until complete ? ?Patient instructed to use prednisolone acetate to the left eye 4 times daily until complete or 3 weeks whichever comes first ?

## 2021-04-10 NOTE — Assessment & Plan Note (Signed)
OS looks great today with excellent scleral buckle support peripherally at all breaks previously encountered in ?Found at surgery.  No new breaks or detachment have developed. ? ?Conjunctiva healing well ?

## 2021-04-10 NOTE — Progress Notes (Signed)
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 "Blurry vision, photophobia, discomfort on the front of the eye almost like a corneal abrasion. In terms of the back of the eye, I started getting a "ceiling fan" spinning about 5 o' clock and extending up to about 10 o' clock. Also noticing long floaters." "The corneal abrasion feeling has been going on since after the surgery, I have the feeling of the "salt" feeling under the eyelid which is expected but this feeling is more on the front of the eye. I am noticing a starburst." Pt is using  Besivance 3 times daily to the operative eye Prednisolone acetate 1 drop to the operative eye 4 times daily Ketorolac 1 drop left eye twice daily       Last edited by Laurin Coder on 04/10/2021  9:56 AM.      Referring physician: [name], [name], DO [address] [place],  [place] [address]  HISTORICAL INFORMATION:   Selected notes from the MEDICAL RECORD NUMBER    Lab Results  Component Value Date   HGBA1C 6.1 07/12/2020     CURRENT MEDICATIONS: Current Outpatient Medications (Ophthalmic Drugs)  Medication Sig   ofloxacin (OCUFLOX) 0.3 % ophthalmic solution Place 1 drop into the left eye in the morning, at noon, in the evening, and at bedtime for 21 days.   No current facility-administered medications for this visit. (Ophthalmic Drugs)   Current Outpatient Medications (Other)  Medication Sig   albuterol (VENTOLIN HFA) 108 (90 Base) MCG/ACT inhaler INHALE 2 PUFFS INTO THE LUNGS EVERY 6 HOURS AS NEEDED FOR WHEEZING OR  SHORTNESS OF BREATH   ALBUTEROL IN Inhale into the lungs as needed.   Albuterol Sulfate 2.5 MG/0.5ML NEBU Inhale into the lungs. 1 nebule every 6 hours as needed   allopurinol (ZYLOPRIM) 300 MG tablet Take 1 tablet (300 mg total) by mouth daily.   atorvastatin (LIPITOR) 40 MG tablet TAKE 1 TABLET EVERY MORNING   azithromycin (ZITHROMAX Z-PAK) 250 MG tablet As directed   Coenzyme Q10 (CO Q 10 PO) Take 1 tablet by mouth daily.   fluticasone furoate-vilanterol (BREO ELLIPTA) 100-25 MCG/INH AEPB USE 1 INHALATION ORALLY    DAILY   nebivolol (BYSTOLIC) 10 MG tablet TAKE 1 TABLET DAILY   olmesartan-hydrochlorothiazide (BENICAR HCT) 40-12.5 MG tablet TAKE 1 TABLET DAILY   omeprazole (PRILOSEC) 40 MG capsule TAKE 1 CAPSULE DAILY   predniSONE (DELTASONE) 10 MG tablet TAKE 3 TABLETS PO QD FOR 3 DAYS THEN TAKE 2 TABLETS PO QD FOR 3 DAYS THEN TAKE 1 TABLET PO QD FOR 3 DAYS THEN TAKE 1/2 TAB PO QD FOR 3 DAYS   silodosin (RAPAFLO) 8 MG CAPS capsule Take 8 mg by mouth at bedtime.   solifenacin (VESICARE) 5 MG tablet Take 1 tablet (5 mg total) by mouth daily.   VITAMIN D PO Take 4,000 Lipoprotein Lipase Releasing Units by mouth daily.  No current facility-administered medications for this visit. (Other)      REVIEW OF SYSTEMS: ROS   Negative for: Constitutional, Gastrointestinal, Neurological, Skin, Genitourinary, Musculoskeletal, HENT, Endocrine, Cardiovascular, Eyes, Respiratory, Psychiatric, Allergic/Imm, Heme/Lymph Last edited by Hurman Horn, MD on 04/10/2021 11:19 AM.       ALLERGIES Allergies  Allergen Reactions   Merthiolate [Thimerosal] Rash    Rash--local    PAST MEDICAL HISTORY Past Medical History:  Diagnosis Date   BPH (benign prostatic hypertrophy)    GERD (gastroesophageal reflux disease)    H/O hiatal hernia    History of colon polyps    Hyperlipidemia    Hypertension    Knee internal derangement    RIGHT   Macular pucker, right eye 07/05/2019   Vitrectomy, removal of  silicone oil, membrane peel, repair complex retinal detachment inferonasal right eye  11-30-19   Mild asthma    Vitamin D deficiency    Wears glasses    Past Surgical History:  Procedure Laterality Date   COLONOSCOPY W/ POLYPECTOMY  01/21/2008   ESOPHAGOGASTRODUODENOSCOPY  11/15/2008   EYE SURGERY Right    x6   [place]   KNEE ARTHROSCOPY Right X4  last one 2005   KNEE ARTHROSCOPY Right 12/06/2013   Procedure: ARTHROSCOPY RIGHT KNEE WITH DEBRIDMENT AND PARTIAL MEDIAL AND LATERAL MENISCECTOMY AND CHONDRLPLASTY;  Surgeon: [name], MD;  Location: [facility];  Service: Orthopedics;  Laterality: Right;   LUMBAR DISC SURGERY  04/02/2007   L5 -- S1   NEGATIVE SLEEP STUDY  per pt   PARATHYROIDECTOMY  02/04/1987   removal adenoma    FAMILY HISTORY Family History  Problem Relation Age of Onset   Hyperlipidemia Mother    Coronary artery disease Mother    Pulmonary embolism Father    Colon cancer Father    Hyperlipidemia Brother    Pancreatic cancer Brother     SOCIAL HISTORY Social History   Tobacco Use   Smoking status: Never   Smokeless tobacco: Never  Substance Use Topics   Alcohol use: Yes    Alcohol/week: 0.0 standard drinks    Comment: social use   Drug use: No         OPHTHALMIC EXAM:  Base Eye Exam     Visual Acuity (ETDRS)       Right Left   Dist [place] 20/100 -2 20/20   Dist ph [place] 20/80 -2          Tonometry (Tonopen, 10:02 AM)       Right Left   Pressure 13 14         Pupils       Dark Light React APD   Right 3 2  None   Left 3 3 Minimal None         Extraocular Movement       Right Left    Full Full         Neuro/Psych     Oriented x3: Yes   Mood/Affect: Normal         Dilation     Left eye: 1.0% Mydriacyl, 2.5% Phenylephrine @ 10:02 AM           Slit Lamp and Fundus Exam     External Exam       Right Left   External Normal Normal         Slit Lamp Exam       Right Left   Lids/Lashes  Normal Normal   Conjunctiva/Sclera  3+ Injection, well closed   Cornea Clear Clear   Anterior Chamber Deep and quiet Deep and quiet   Iris Round and reactive Round and reactive   Lens Posterior chamber intraocular lens, 1+ Posterior capsular opacification Centered posterior chamber intraocular lens   Anterior Vitreous Normal Normal         Fundus Exam       Right Left   Posterior Vitreous  Clear, vitrectomized   Disc  Normal   C/D Ratio  0.2   Macula  Normal   Vessels  Normal   Periphery  Good laser retinopexy nasal, good cryopexy superotemporal centered at 130 horseshoe tear and 2:00 and 12:00 atrophy, all with good retinopexy, 360 excellent scleral buckle indentation supporting the breaks previously seen as well as the new ones found superotemporally at time of surgery with good cryopexy reaction, no breaks no new tears completely attached            IMAGING AND PROCEDURES  Imaging and Procedures for 04/10/21  OCT, Retina - OU - Both Eyes       Right Eye Quality was good. Scan locations included subfoveal. Central Foveal Thickness: 298. Progression has improved. Findings include abnormal foveal contour, outer retinal atrophy, central retinal atrophy.   Left Eye Quality was good. Scan locations included subfoveal. Central Foveal Thickness: 290. Progression has been stable. Findings include normal foveal contour.   Notes Much less topographic distortion, some foveal atrophy exist centrally OD no epiretinal membrane in the macular region for well two disc diameters radius   No epiretinal tissue preproliferation.  Outer retinal photoreceptor layer irregular in the foveal region yet still recognizable OS normal contour  OS with vitreous debris     Color Fundus Photography Optos - OU - Both Eyes       Right Eye Progression has been stable. Disc findings include pallor.   Left Eye Progression has improved.   Notes OS, looks great, 1 week postop vitrectomy,  scleral buckle, cryopexy attached no new breaks    OD unchanged             ASSESSMENT/PLAN:  Retinal tears, multiple, without detachment, left OS looks great today with excellent scleral buckle support peripherally at all breaks previously encountered in Found at surgery.  No new breaks or detachment have developed.  Conjunctiva healing well      ICD-10-CM   1. Retinal tears, multiple, without detachment, left  H33.332 OCT, Retina - OU - Both Eyes    Color Fundus Photography Optos - OU - Both Eyes      1.  2.  3.  Ophthalmic Meds Ordered this visit:  No orders of the defined types were placed in this encounter.      Return in about 3 weeks (around 05/01/2021) for dilate, OS, COLOR FP.  Patient Instructions  Patient instructed to use Besivance until complete  Patient instructed to use prednisolone acetate to the left eye 4 times daily until complete or 3 weeks whichever comes first   Explained the diagnoses, plan, and follow up with the patient and they expressed understanding.  Patient expressed understanding of the importance of proper follow up care.   Clent Demark Chrisanne Loose M.D. Diseases & Surgery of the Retina and Vitreous Retina & Diabetic [place] 04/10/21     Abbreviations: M myopia (nearsighted); A astigmatism; H hyperopia (farsighted); P presbyopia; Mrx spectacle prescription;  CTL contact lenses; OD right eye; OS left eye; OU both eyes  XT exotropia; ET esotropia;  PEK punctate epithelial keratitis; PEE punctate epithelial erosions; DES dry eye syndrome; MGD meibomian gland dysfunction; ATs artificial tears; PFAT's preservative free artificial tears; [place] nuclear sclerotic cataract; PSC posterior subcapsular cataract; ERM epi-retinal membrane; PVD posterior vitreous detachment; RD retinal detachment; DM diabetes mellitus; DR diabetic retinopathy; NPDR non-proliferative diabetic retinopathy; PDR proliferative diabetic retinopathy; CSME clinically  significant macular edema; DME diabetic macular edema; dbh dot blot hemorrhages; CWS cotton wool spot; POAG primary open angle glaucoma; C/D cup-to-disc ratio; HVF humphrey visual field; GVF goldmann visual field; OCT optical coherence tomography; IOP intraocular pressure; BRVO Branch retinal vein occlusion; CRVO central retinal vein occlusion; CRAO central retinal artery occlusion; BRAO branch retinal artery occlusion; RT retinal tear; SB scleral buckle; PPV pars plana vitrectomy; VH Vitreous hemorrhage; PRP panretinal laser photocoagulation; IVK intravitreal kenalog; VMT vitreomacular traction; MH Macular hole;  NVD neovascularization of the disc; NVE neovascularization elsewhere; AREDS age related eye disease study; ARMD age related macular degeneration; POAG primary open angle glaucoma; EBMD epithelial/anterior basement membrane dystrophy; ACIOL anterior chamber intraocular lens; IOL intraocular lens; PCIOL posterior chamber intraocular lens; Phaco/IOL phacoemulsification with intraocular lens placement; [place] photorefractive keratectomy; LASIK laser assisted in situ keratomileusis; HTN hypertension; DM diabetes mellitus; COPD chronic obstructive pulmonary disease

## 2021-04-15 ENCOUNTER — Encounter (AMBULATORY_SURGERY_CENTER): Payer: Federal, State, Local not specified - PPO | Admitting: Ophthalmology

## 2021-04-16 ENCOUNTER — Encounter (INDEPENDENT_AMBULATORY_CARE_PROVIDER_SITE_OTHER): Payer: Self-pay

## 2021-05-01 ENCOUNTER — Ambulatory Visit (INDEPENDENT_AMBULATORY_CARE_PROVIDER_SITE_OTHER): Payer: Federal, State, Local not specified - PPO | Admitting: Ophthalmology

## 2021-05-01 ENCOUNTER — Encounter (INDEPENDENT_AMBULATORY_CARE_PROVIDER_SITE_OTHER): Payer: Self-pay | Admitting: Ophthalmology

## 2021-05-01 ENCOUNTER — Other Ambulatory Visit: Payer: Self-pay

## 2021-05-01 ENCOUNTER — Encounter (INDEPENDENT_AMBULATORY_CARE_PROVIDER_SITE_OTHER): Payer: Self-pay

## 2021-05-01 DIAGNOSIS — H33332 Multiple defects of retina without detachment, left eye: Secondary | ICD-10-CM

## 2021-05-01 DIAGNOSIS — H35371 Puckering of macula, right eye: Secondary | ICD-10-CM

## 2021-05-01 NOTE — Patient Instructions (Signed)
Patient instructed to decrease prednisolone acetate 1 drop left eye once daily for 1 more week and then may use it once a week for up to 2 weeks but do not refill the medication ?

## 2021-05-01 NOTE — Assessment & Plan Note (Signed)
Condition resolved 

## 2021-05-01 NOTE — Progress Notes (Signed)
? ? ?05/01/2021 ? ?  ? ?CHIEF COMPLAINT ?Patient presents for No chief complaint on file. ? ? ? ? 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 ?HPI   ?3 weeks dilate OS, Color FP. ?Pt states he wakes up and still has irritation in his eye. States he saw Dr. Talbert Forest two weeks ago and Dr. Talbert Forest said he saw stitches still and is "hoping that is what it is." Pt is using Prednisolone OS QID. ?Pt reports "I still have photophobia, it is better, but is still there." ?Last edited by Laurin Coder on 05/01/2021  8:18 AM.  ?  ? ? ?Referring physician: ?[name], Kendrick Fries R, DO ?[address] ?[address] ?[place],  [place] [address] ? ?HISTORICAL INFORMATION:  ? ?Selected notes from the [facility] ?  ? ?Lab Results  ?Component Value Date  ? HGBA1C 6.1 07/12/2020  ?  ? ?CURRENT MEDICATIONS: ?No current outpatient medications on file. (Ophthalmic Drugs)  ? ?No current facility-administered medications for this visit. (Ophthalmic Drugs)  ? ?Current Outpatient Medications (Other)  ?Medication Sig  ? albuterol (VENTOLIN HFA) 108 (90 Base) MCG/ACT inhaler INHALE 2 PUFFS INTO THE LUNGS EVERY 6 HOURS AS NEEDED FOR WHEEZING OR SHORTNESS OF BREATH  ? ALBUTEROL IN Inhale into the lungs as needed.  ? Albuterol Sulfate 2.5 MG/0.5ML NEBU Inhale into the lungs. 1 nebule every 6 hours as needed  ? allopurinol (ZYLOPRIM) 300 MG tablet Take 1 tablet (300 mg total) by mouth daily.  ? atorvastatin (LIPITOR) 40 MG tablet TAKE 1 TABLET EVERY MORNING  ? azithromycin (ZITHROMAX Z-PAK) 250 MG tablet As directed  ? Coenzyme Q10 (CO Q 10 PO) Take 1 tablet by mouth daily.  ? fluticasone furoate-vilanterol (BREO ELLIPTA) 100-25 MCG/INH AEPB USE 1 INHALATION ORALLY    DAILY  ? nebivolol (BYSTOLIC) 10 MG tablet TAKE 1 TABLET DAILY  ? olmesartan-hydrochlorothiazide (BENICAR HCT) 40-12.5 MG tablet TAKE 1 TABLET DAILY  ? omeprazole (PRILOSEC) 40 MG capsule TAKE 1 CAPSULE DAILY  ? predniSONE  (DELTASONE) 10 MG tablet TAKE 3 TABLETS PO QD FOR 3 DAYS THEN TAKE 2 TABLETS PO QD FOR 3 DAYS THEN TAKE 1 TABLET PO QD FOR 3 DAYS THEN TAKE 1/2 TAB PO QD FOR 3 DAYS  ? silodosin (RAPAFLO) 8 MG CAPS capsule Take 8 mg by mouth at bedtime.  ? solifenacin (VESICARE) 5 MG tablet Take 1 tablet (5 mg total) by mouth daily.  ? VITAMIN D PO Take 4,000 Lipoprotein Lipase Releasing Units by mouth daily.  ? ?No current facility-administered medications for this visit. (Other)  ? ? ? ? ?REVIEW OF SYSTEMS: ?ROS   ?Negative for: Constitutional, Gastrointestinal, Neurological, Skin, Genitourinary, Musculoskeletal, HENT, Endocrine, Cardiovascular, Eyes, Respiratory, Psychiatric, Allergic/Imm, Heme/Lymph ?Last edited by Hurman Horn, MD on 05/01/2021  8:24 AM.  ?  ? ? ? ?ALLERGIES ?Allergies  ?Allergen Reactions  ? Merthiolate [Thimerosal] Rash  ?  Rash--local  ? ? ?PAST MEDICAL HISTORY ?Past Medical History:  ?Diagnosis Date  ? BPH (benign prostatic hypertrophy)   ? GERD (gastroesophageal reflux disease)   ? H/O hiatal hernia   ? History of colon polyps   ? Hyperlipidemia   ? Hypertension   ? Knee internal derangement   ? RIGHT  ? Macular pucker, right eye 07/05/2019  ? Vitrectomy, removal of silicone oil, membrane peel, repair complex retinal detachment inferonasal right eye  11-30-19  ? Macular  pucker, right eye 07/05/2019  ? Vitrectomy, removal of silicone oil, membrane peel, repair complex retinal detachment inferonasal right eye  11-30-19  ? Mild asthma   ? Nuclear sclerotic cataract of left eye 03/26/2021  ? Discussion with patient and family that medical reasons that cataract surgery should be undertaken so as to allow for ultimately complete removal of the anterior hyaloid at time of vitrectomy  ? Vitamin D deficiency   ? Wears glasses   ? ?Past Surgical History:  ?Procedure Laterality Date  ? COLONOSCOPY W/ POLYPECTOMY  01/21/2008  ? ESOPHAGOGASTRODUODENOSCOPY  11/15/2008  ? EYE SURGERY Right   ? x6   [facility] [place]  ? KNEE ARTHROSCOPY  Right X4  last one 2005  ? KNEE ARTHROSCOPY Right 12/06/2013  ? Procedure: ARTHROSCOPY RIGHT KNEE WITH DEBRIDMENT AND PARTIAL MEDIAL AND LATERAL MENISCECTOMY AND CHONDRLPLASTY;  Surgeon: [name], MD;  Location: [facility];  Service: Orthopedics;  Laterality: Right;  ? [place] SURGERY  04/02/2007  ? L5 -- S1  ? NEGATIVE SLEEP STUDY  per pt  ? PARATHYROIDECTOMY  02/04/1987  ? removal adenoma  ? ? ?FAMILY HISTORY ?Family History  ?Problem Relation Age of Onset  ? Hyperlipidemia Mother   ? Coronary artery disease Mother   ? Pulmonary embolism Father   ? Colon cancer Father   ? Hyperlipidemia Brother   ? Pancreatic cancer Brother   ? ? ?SOCIAL HISTORY ?Social History  ? ?Tobacco Use  ? Smoking status: Never  ? Smokeless tobacco: Never  ?Substance Use Topics  ? Alcohol use: Yes  ?  Alcohol/week: 0.0 standard drinks  ?  Comment: social use  ? Drug use: No  ? ?  ? ?  ? ?OPHTHALMIC EXAM: ? ?Base Eye Exam   ? ? Visual Acuity (ETDRS)   ? ?   Right Left  ? Dist [place] 20/100 20/25  ? Dist ph [place] 20/80 -2   ?Patient states with his left eye: double on every letter, every line. "Stacked one on top of the other," per patient. ? ?  ?  ? ? Tonometry (Tonopen, 8:18 AM)   ? ?   Right Left  ? Pressure 9 8  ? ?  ?  ? ? Pupils   ? ?   Pupils Dark Light APD  ? Right PERRL 2 2 None  ? Left PERRL 3 3 None  ? ?  ?  ? ? Extraocular Movement   ? ?   Right Left  ?  Full Full  ? ?  ?  ? ? Neuro/Psych   ? ? Oriented x3: Yes  ? Mood/Affect: Normal  ? ?  ?  ? ? Dilation   ? ? Left eye: 1.0% Mydriacyl, 2.5% Phenylephrine @ 8:18 AM  ? ?  ?  ? ?  ? ?Slit Lamp and Fundus Exam   ? ? External Exam   ? ?   Right Left  ? External Normal Normal  ? ?  ?  ? ? Slit Lamp Exam   ? ?   Right Left  ? Lids/Lashes Normal Normal  ? Conjunctiva/Sclera  3+ Injection, well closed  ? Cornea Clear Clear  ? Anterior Chamber Deep and quiet Deep and quiet  ? Iris Round and reactive Round and reactive  ? Lens Posterior chamber intraocular lens, 1+  Posterior capsular opacification Centered posterior chamber intraocular lens  ? Anterior Vitreous Normal Normal  ? ?  ?  ? ? Fundus Exam   ? ?  Right Left  ? Posterior Vitreous  Clear, vitrectomized  ? Disc  Normal  ? C/D Ratio  0.2  ? Macula  Normal  ? Vessels  Normal  ? Periphery  Good laser retinopexy nasal, good cryopexy superotemporal centered at 130 horseshoe tear and 2:00 and 12:00 atrophy, all with good retinopexy, 360 excellent scleral buckle indentation supporting the breaks previously seen as well as the new ones found superotemporally at time of surgery with good cryopexy reaction, no breaks no new tears completely attached  ? ?  ?  ? ?  ? ?Refraction   ? ? Wearing Rx   ? ?   Sphere  ? Right Balance  ? Left +0.75  ? ?  ?  ? ?  ? ? ?IMAGING AND PROCEDURES  ?Imaging and Procedures for 05/01/21 ? ?Color Fundus Photography Optos - OU - Both Eyes   ? ?   ?Left Eye ?Disc findings include normal observations. Macula : normal observations. Vessels : normal observations. Periphery : normal observations.  ? ?Notes ?Clear media OS, retina attached good buckling effect OS ? ?History of complex RD OD, with Posterior segment chorioretinal scarring, inferior retinectomy, macula attached, clear media ? ?  ? ? ?  ?  ? ?  ?ASSESSMENT/PLAN: ? ?Retinal tears, multiple, without detachment, left ?Slight monocular diplopia remains OS with pseudophakia and post scleral buckle vitrectomy repair retinal detachment likely from induced cylinder to the anterior segment. ? ?Diagnostic, nonspecific manifest refraction performed in subjective fashion with a negative sphere result of ? ?+0.7 5 -1.25 at Axis 32 degrees OS with 20/20 vision, and subjectively much better than without ? ?Macular pucker, right eye ?Condition resolved  ? ?  ICD-10-CM   ?1. Retinal tears, multiple, without detachment, left  H33.332 Color Fundus Photography Optos - OU - Both Eyes  ?  ?2. Macular pucker, right eye  H35.371   ?  ? ? ?1.Patient still trouble with  diplopia with binocularity.  Now with monocular diplopia left eye likely residual astigmatism, refraction pending discontinuation of topical steroids and some conjunctival Vicryl sutures, dissolution ? ?

## 2021-05-01 NOTE — Assessment & Plan Note (Signed)
Slight monocular diplopia remains OS with pseudophakia and post scleral buckle vitrectomy repair retinal detachment likely from induced cylinder to the anterior segment. ? ?Diagnostic, nonspecific manifest refraction performed in subjective fashion with a negative sphere result of ? ?+0.7 5 -1.25 at Axis 32 degrees OS with 20/20 vision, and subjectively much better than without ?

## 2021-06-12 ENCOUNTER — Encounter (INDEPENDENT_AMBULATORY_CARE_PROVIDER_SITE_OTHER): Payer: Self-pay | Admitting: Ophthalmology

## 2021-06-12 ENCOUNTER — Ambulatory Visit (INDEPENDENT_AMBULATORY_CARE_PROVIDER_SITE_OTHER): Payer: Federal, State, Local not specified - PPO | Admitting: Ophthalmology

## 2021-06-12 DIAGNOSIS — H33332 Multiple defects of retina without detachment, left eye: Secondary | ICD-10-CM

## 2021-06-12 NOTE — Progress Notes (Signed)
? ? ?06/12/2021 ? ?  ? ?CHIEF COMPLAINT ?Patient presents for  ?Chief Complaint  ?Patient presents with  ? Retina Follow Up  ? ? ? ? 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 ?HPI   ? ? Retina Follow Up   ? ?      ? Diagnosis: Other (Retinal tears, multiple, without detachment)  ? Laterality: left eye  ? Onset: 6 weeks ago  ? ?  ?  ? ? Comments   ?6 weeks dilate OS, Color FP, OCT. ?"Finally starting to make it's way back to a normal level. I still need to be refracted. I see Dr. Talbert Forest tomorrow for that to be done." ?Patient is not using any prescription eye drops currently. ?No hospitalizations or surgeries since last visit. ? ? ?  ?  ?Last edited by Laurin Coder on 06/12/2021 10:58 AM.  ?  ? ? ?Referring physician: ?[name], Kendrick Fries R, DO ?[address] ?[address] ?[place],  [place] [address] ? ?HISTORICAL INFORMATION:  ? ?Selected notes from the [facility] ?  ? ?Lab Results  ?Component Value Date  ? HGBA1C 6.1 07/12/2020  ?  ? ?CURRENT MEDICATIONS: ?No current outpatient medications on file. (Ophthalmic Drugs)  ? ?No current facility-administered medications for this visit. (Ophthalmic Drugs)  ? ?Current Outpatient Medications (Other)  ?Medication Sig  ? albuterol (VENTOLIN HFA) 108 (90 Base) MCG/ACT inhaler INHALE 2 PUFFS INTO THE LUNGS EVERY 6 HOURS AS NEEDED FOR WHEEZING OR SHORTNESS OF BREATH  ? ALBUTEROL IN Inhale into the lungs as needed.  ? Albuterol Sulfate 2.5 MG/0.5ML NEBU Inhale into the lungs. 1 nebule every 6 hours as needed  ? allopurinol (ZYLOPRIM) 300 MG tablet Take 1 tablet (300 mg total) by mouth daily.  ? atorvastatin (LIPITOR) 40 MG tablet TAKE 1 TABLET EVERY MORNING  ? azithromycin (ZITHROMAX Z-PAK) 250 MG tablet As directed  ? Coenzyme Q10 (CO Q 10 PO) Take 1 tablet by mouth daily.  ? fluticasone furoate-vilanterol (BREO ELLIPTA) 100-25 MCG/INH AEPB USE 1 INHALATION ORALLY    DAILY  ? nebivolol (BYSTOLIC) 10 MG  tablet TAKE 1 TABLET DAILY  ? olmesartan-hydrochlorothiazide (BENICAR HCT) 40-12.5 MG tablet TAKE 1 TABLET DAILY  ? omeprazole (PRILOSEC) 40 MG capsule TAKE 1 CAPSULE DAILY  ? predniSONE (DELTASONE) 10 MG tablet TAKE 3 TABLETS PO QD FOR 3 DAYS THEN TAKE 2 TABLETS PO QD FOR 3 DAYS THEN TAKE 1 TABLET PO QD FOR 3 DAYS THEN TAKE 1/2 TAB PO QD FOR 3 DAYS  ? silodosin (RAPAFLO) 8 MG CAPS capsule Take 8 mg by mouth at bedtime.  ? solifenacin (VESICARE) 5 MG tablet Take 1 tablet (5 mg total) by mouth daily.  ? VITAMIN D PO Take 4,000 Lipoprotein Lipase Releasing Units by mouth daily.  ? ?No current facility-administered medications for this visit. (Other)  ? ? ? ? ?REVIEW OF SYSTEMS: ?ROS   ?Negative for: Constitutional, Gastrointestinal, Neurological, Skin, Genitourinary, Musculoskeletal, HENT, Endocrine, Cardiovascular, Eyes, Respiratory, Psychiatric, Allergic/Imm, Heme/Lymph ?Last edited by Hurman Horn, MD on 06/12/2021 11:46 AM.  ?  ? ? ? ?ALLERGIES ?Allergies  ?Allergen Reactions  ? Merthiolate [Thimerosal] Rash  ?  Rash--local  ? ? ?PAST MEDICAL HISTORY ?Past Medical History:  ?Diagnosis Date  ? BPH (benign prostatic hypertrophy)   ? GERD (gastroesophageal reflux disease)   ? H/O hiatal hernia   ? History of colon polyps   ?  Hyperlipidemia   ? Hypertension   ? Knee internal derangement   ? RIGHT  ? Macular pucker, right eye 07/05/2019  ? Vitrectomy, removal of silicone oil, membrane peel, repair complex retinal detachment inferonasal right eye  11-30-19  ? Macular pucker, right eye 07/05/2019  ? Vitrectomy, removal of silicone oil, membrane peel, repair complex retinal detachment inferonasal right eye  11-30-19  ? Mild asthma   ? Nuclear sclerotic cataract of left eye 03/26/2021  ? Discussion with patient and family that medical reasons that cataract surgery should be undertaken so as to allow for ultimately complete removal of the anterior hyaloid at time of vitrectomy  ? Vitamin D deficiency   ? Wears glasses   ? ?Past  Surgical History:  ?Procedure Laterality Date  ? COLONOSCOPY W/ POLYPECTOMY  01/21/2008  ? ESOPHAGOGASTRODUODENOSCOPY  11/15/2008  ? EYE SURGERY Right   ? x6   Reggie Welge  ? KNEE ARTHROSCOPY Right X4  last one 2005  ? KNEE ARTHROSCOPY Right 12/06/2013  ? Procedure: ARTHROSCOPY RIGHT KNEE WITH DEBRIDMENT AND PARTIAL MEDIAL AND LATERAL MENISCECTOMY AND CHONDRLPLASTY;  Surgeon: [name], MD;  Location: [facility];  Service: Orthopedics;  Laterality: Right;  ? [place] SURGERY  04/02/2007  ? L5 -- S1  ? NEGATIVE SLEEP STUDY  per pt  ? PARATHYROIDECTOMY  02/04/1987  ? removal adenoma  ? ? ?FAMILY HISTORY ?Family History  ?Problem Relation Age of Onset  ? Hyperlipidemia Mother   ? Coronary artery disease Mother   ? Pulmonary embolism Father   ? Colon cancer Father   ? Hyperlipidemia Brother   ? Pancreatic cancer Brother   ? ? ?SOCIAL HISTORY ?Social History  ? ?Tobacco Use  ? Smoking status: Never  ? Smokeless tobacco: Never  ?Substance Use Topics  ? Alcohol use: Yes  ?  Alcohol/week: 0.0 standard drinks  ?  Comment: social use  ? Drug use: No  ? ?  ? ?  ? ?OPHTHALMIC EXAM: ? ?Base Eye Exam   ? ? Visual Acuity (ETDRS)   ? ?   Right Left  ? Dist [place] 20/125 -1 20/25 +2  ? Dist ph [place] NI   ? ?  ?  ? ? Tonometry (Tonopen, 11:01 AM)   ? ?   Right Left  ? Pressure 11 7  ? ?  ?  ? ? Pupils   ? ?   Dark Light React APD  ? Right 2 2 Minimal None  ? Left 2.5 2.5 Minimal None  ? ?  ?  ? ? Visual Fields   ? ?   Left Right  ?  Full   ? ?  ?  ? ? Extraocular Movement   ? ?   Right Left  ?  Full Full  ? ?  ?  ? ? Neuro/Psych   ? ? Oriented x3: Yes  ? Mood/Affect: Normal  ? ?  ?  ? ? Dilation   ? ? Left eye: 1.0% Mydriacyl, 2.5% Phenylephrine @ 11:01 AM  ? ?  ?  ? ?  ? ?Slit Lamp and Fundus Exam   ? ? External Exam   ? ?   Right Left  ? External Normal Normal  ? ?  ?  ? ? Slit Lamp Exam   ? ?   Right Left  ? Lids/Lashes Normal Normal  ? Conjunctiva/Sclera  well closed, no injection  ? Cornea Clear Clear  ? Anterior  Chamber Deep and quiet Deep  and quiet  ? Iris Round and reactive Round and reactive  ? Lens Posterior chamber intraocular lens, 1+ Posterior capsular opacification Centered posterior chamber intraocular lens  ? Anterior Vitreous Normal Normal  ? ?  ?  ? ? Fundus Exam   ? ?   Right Left  ? Posterior Vitreous  Clear, vitrectomized  ? Disc  Normal  ? C/D Ratio  0.2  ? Macula  Normal  ? Vessels  Normal  ? Periphery  Good laser retinopexy nasal, good cryopexy superotemporal centered at 130 horseshoe tear and 2:00 and 12:00 atrophy, all with good retinopexy, 360 excellent scleral buckle indentation supporting the breaks previously seen as well as the new ones found superotemporally at time of surgery with good cryopexy reaction, no breaks no new tears completely attached  ? ?  ?  ? ?  ? ? ?IMAGING AND PROCEDURES  ?Imaging and Procedures for 06/12/21 ? ?OCT, Retina - OU - Both Eyes   ? ?   ?Right Eye ?Quality was good. Scan locations included subfoveal. Central Foveal Thickness: 268. Progression has improved. Findings include abnormal foveal contour, outer retinal atrophy, central retinal atrophy.  ? ?Left Eye ?Quality was good. Scan locations included subfoveal. Central Foveal Thickness: 320. Progression has been stable. Findings include normal foveal contour.  ? ?Notes ?Much less topographic distortion, some foveal atrophy exist centrally OD no epiretinal membrane in the macular region for well two disc diameters radius ? ? ?No epiretinal tissue preproliferation.  Outer retinal photoreceptor layer irregular in the foveal region yet still recognizable ?OS normal contour ? ? ? ?  ? ?Color Fundus Photography Optos - OU - Both Eyes   ? ?   ?Left Eye ?Disc findings include normal observations. Macula : normal observations. Vessels : normal observations. Periphery : normal observations.  ? ?Notes ?Clear media OS, retina attached good buckling effect OS ? ?History of complex RD OD, with Posterior segment chorioretinal scarring,  inferior retinectomy, macula attached, clear media ? ?  ? ? ?  ?  ? ?  ?ASSESSMENT/PLAN: ? ?Retinal tears, multiple, without detachment, left ?OS looks great, now some 2 months post definitive repair via

## 2021-06-12 NOTE — Assessment & Plan Note (Signed)
OS looks great, now some 2 months post definitive repair via vitrectomy, scleral band placement in an encircling fashion cryopexy to retinal breaks additional retinal breaks found at the time of surgery temporally in addition to those initially nasally. ? ? ?

## 2021-06-13 ENCOUNTER — Encounter (INDEPENDENT_AMBULATORY_CARE_PROVIDER_SITE_OTHER): Payer: Self-pay

## 2021-06-20 DIAGNOSIS — C61 Malignant neoplasm of prostate: Secondary | ICD-10-CM | POA: Diagnosis not present

## 2021-07-10 ENCOUNTER — Other Ambulatory Visit: Payer: Self-pay | Admitting: Family Medicine

## 2021-07-10 DIAGNOSIS — I10 Essential (primary) hypertension: Secondary | ICD-10-CM

## 2021-07-10 DIAGNOSIS — J4520 Mild intermittent asthma, uncomplicated: Secondary | ICD-10-CM

## 2021-07-10 DIAGNOSIS — M1A0790 Idiopathic chronic gout, unspecified ankle and foot, without tophus (tophi): Secondary | ICD-10-CM

## 2021-07-10 DIAGNOSIS — E785 Hyperlipidemia, unspecified: Secondary | ICD-10-CM

## 2021-07-25 DIAGNOSIS — M25521 Pain in right elbow: Secondary | ICD-10-CM | POA: Diagnosis not present

## 2021-08-02 DIAGNOSIS — N401 Enlarged prostate with lower urinary tract symptoms: Secondary | ICD-10-CM | POA: Diagnosis not present

## 2021-08-02 DIAGNOSIS — C61 Malignant neoplasm of prostate: Secondary | ICD-10-CM | POA: Diagnosis not present

## 2021-08-02 DIAGNOSIS — R350 Frequency of micturition: Secondary | ICD-10-CM | POA: Diagnosis not present

## 2021-08-05 DIAGNOSIS — K4090 Unilateral inguinal hernia, without obstruction or gangrene, not specified as recurrent: Secondary | ICD-10-CM | POA: Diagnosis not present

## 2021-08-06 ENCOUNTER — Encounter (HOSPITAL_COMMUNITY): Payer: Self-pay | Admitting: Surgery

## 2021-08-06 ENCOUNTER — Ambulatory Visit: Payer: Self-pay | Admitting: Surgery

## 2021-08-06 DIAGNOSIS — K4090 Unilateral inguinal hernia, without obstruction or gangrene, not specified as recurrent: Secondary | ICD-10-CM

## 2021-08-06 NOTE — H&P (Signed)
PROVIDER: Jenilee Franey Charlotta Newton, MD    Chief Complaint: New Consultation (Right inguinal hernia)   History of Present Illness:  Patient is self-referred. Patient has a approximately 6 to 7-year history of an intermittent bulge in the right groin. He first experienced discomfort and a small bulge while exercising. This is gradually increased in size. It has become progressively more difficult to reduce. Patient does have discomfort associated with bowel movements. Patient may have had a pediatric hernia repair as an infant. He has had no symptoms on the left side. He has had no other abdominal surgery. Patient is a retired Technical brewer.  Review of Systems: A complete review of systems was obtained from the patient. I have reviewed this information and discussed as appropriate with the patient. See HPI as well for other ROS.  Review of Systems  Constitutional: Negative.  HENT: Negative.  Eyes: Positive for blurred vision and double vision.  Respiratory: Negative.  Cardiovascular: Negative.  Gastrointestinal: Positive for constipation.  Genitourinary: Negative.  Musculoskeletal: Negative.  Skin: Negative.  Neurological: Negative.  Endo/Heme/Allergies: Negative.  Psychiatric/Behavioral: Negative.   Medical History: Past Medical History:  Diagnosis Date  Asthma, unspecified asthma severity, unspecified whether complicated, unspecified whether persistent  Hypertension  Retinal detachment  Vision abnormalities   Patient Active Problem List  Diagnosis  Right inguinal hernia   Past Surgical History:  Procedure Laterality Date  parathyroid adenoma 1989  knee surgeries  VITREOUS RETINAL SURGERY    Allergies  Allergen Reactions  Mercury (Bulk) Hives   Current Outpatient Medications on File Prior to Visit  Medication Sig Dispense Refill  albuterol 90 mcg/actuation inhaler Inhale into the lungs  atorvastatin (LIPITOR) 40 MG tablet atorvastatin 40 mg tablet   nebivoloL (BYSTOLIC) 10 MG tablet Bystolic 10 mg tablet  olmesartan-hydrochlorothiazide (BENICAR HCT) 40-12.5 mg tablet olmesartan 40 mg-hydrochlorothiazide 12.5 mg tablet  omeprazole (PRILOSEC) 40 MG DR capsule omeprazole 40 mg capsule,delayed release  silodosin (RAPAFLO) 8 mg capsule silodosin 8 mg capsule  solifenacin (VESICARE) 5 MG tablet  ALBUTEROL INHAL Inhale into the lungs as needed (Patient not taking: Reported on 08/05/2021)  besifloxacin (BESIVANCE) 0.6 % Apply to eye as directed Every 2 hours right eye while awake right eye (Patient not taking: Reported on 08/05/2021)  bromfenac (PROLENSA) 0.07 % ophthalmic solution Place 1 drop into the right eye once daily (Patient not taking: Reported on 08/05/2021)  cholecalciferol (VITAMIN D3) 2,000 unit tablet Take by mouth (Patient not taking: Reported on 08/05/2021)  fluticasone furoate-vilanteroL (BREO ELLIPTA) 100-25 mcg/dose DsDv inhaler Breo Ellipta 100 mcg-25 mcg/dose powder for inhalation (Patient not taking: Reported on 08/05/2021)  fluticasone furoate-vilanteroL (BREO ELLIPTA) 100-25 mcg/dose DsDv inhaler Inhale into the lungs (Patient not taking: Reported on 08/05/2021)  multivitamin tablet Take by mouth (Patient not taking: Reported on 08/05/2021)  omeprazole (PRILOSEC OTC) 20 MG EC tablet (Patient not taking: Reported on 08/05/2021)  prednisoLONE acetate (PRED FORTE) 1 % ophthalmic suspension Apply to eye (Patient not taking: Reported on 08/05/2021)   No current facility-administered medications on file prior to visit.   Family History  Problem Relation Age of Onset  Stroke Mother  High blood pressure (Hypertension) Mother  Hyperlipidemia (Elevated cholesterol) Mother  Coronary Artery Disease (Blocked arteries around heart) Mother  Colon cancer Father  Glaucoma Neg Hx  Macular degeneration Neg Hx    Social History   Tobacco Use  Smoking Status Never  Smokeless Tobacco Never    Social History   Socioeconomic History  Marital  status:  Married  Tobacco Use  Smoking status: Never  Smokeless tobacco: Never  Vaping Use  Vaping Use: Never used  Substance and Sexual Activity  Alcohol use: Yes  Comment: social  Drug use: Never   Objective:   Vitals:  BP: 120/68  Pulse: 73  Temp: 37.3 C (99.1 F)  SpO2: 97%  Weight: 92.7 kg (204 lb 6.4 oz)  Height: 188 cm ('6\' 2"'$ )   Body mass index is 26.24 kg/m.  Physical Exam   GENERAL APPEARANCE Comfortable, no acute issues Development: normal Gross deformities: none  SKIN Rash, lesions, ulcers: none Induration, erythema: none Nodules: none palpable  EYES Conjunctiva and lids: normal Pupils: equal and reactive  EARS, NOSE, MOUTH, THROAT External ears: no lesion or deformity External nose: no lesion or deformity Hearing: grossly normal  NECK Symmetric: yes Trachea: midline Thyroid: no palpable nodules in the thyroid bed  CHEST Respiratory effort: normal Retraction or accessory muscle use: no Breath sounds: normal bilaterally Rales, rhonchi, wheeze: none  CARDIOVASCULAR Auscultation: regular rhythm, normal rate Murmurs: none Pulses: radial pulse 2+ palpable Lower extremity edema: none  ABDOMEN Not assessed  GENITOURINARY There is a visible bulge in the right groin consistent with right inguinal hernia. On palpation there is a likely direct inguinal hernia which is partially reducible but not completely reducible with manipulation. It augments with cough and Valsalva. Palpation in the left inguinal canal is without evidence of hernia.  MUSCULOSKELETAL Station and gait: normal Digits and nails: no clubbing or cyanosis Muscle strength: grossly normal all extremities Range of motion: grossly normal all extremities Deformity: none  LYMPHATIC Cervical: none palpable Supraclavicular: none palpable  PSYCHIATRIC Oriented to person, place, and time: yes Mood and affect: normal for situation Judgment and insight: appropriate for  situation   Assessment and Plan:   Right inguinal hernia  Patient presents today with a symptomatic right inguinal hernia. This is reducible with some difficulty but has become more difficult to reduce over time. It does cause him some symptoms with bowel movements. Patient is interested in proceeding with repair in the near future.  Today we discussed repair of right inguinal hernia using mesh. We discussed laparoscopic versus open technique. I have recommended open surgery due to the low recurrence rate. We discussed restrictions on his activities following the procedure. The patient understands and wishes to proceed with operative repair in the near future.   Armandina Gemma, MD [facility] [place] [facility] Office: [phone]

## 2021-08-07 ENCOUNTER — Encounter (HOSPITAL_COMMUNITY): Payer: Self-pay | Admitting: Surgery

## 2021-08-07 ENCOUNTER — Other Ambulatory Visit: Payer: Self-pay

## 2021-08-07 NOTE — Progress Notes (Signed)
Attempted to obtain medical history via telephone, unable to reach at this time. HIPAA compliant voicemail message left requesting return call to pre surgical testing department. 

## 2021-08-07 NOTE — Progress Notes (Addendum)
For Short Stay: [place] appointment date: N/A Date of COVID positive in last 90 days: N/A  Bowel Prep reminder: N/A   For Anesthesia: PCP - Ann Held, DO Cardiologist - N/A  Chest x-ray - N/A EKG - 2015 Stress Test - greater than 2 years ago ECHO - N/A Cardiac Cath - N/A Pacemaker/ICD device last checked: N/A Pacemaker orders received: N/A Device Rep notified: N/A  Spinal Cord Stimulator: N/A  Sleep Study - N/A CPAP -  N/A  Fasting Blood Sugar - N/A Checks Blood Sugar ___N/A__ times a day Date and result of last Hgb A1c- N/A  Blood Thinner Instructions: N/A Aspirin Instructions: N/A Last Dose: N/A  Activity level: Able to exercise without chest pain and/or shortness of breath        Anesthesia review: N/A  Patient denies shortness of breath, fever, cough and chest pain at PAT appointment   Patient verbalized understanding of instructions that were given to them at the PAT appointment. Patient was also instructed that they will need to review over the PAT instructions again at home before surgery.

## 2021-08-08 ENCOUNTER — Ambulatory Visit (HOSPITAL_COMMUNITY): Payer: Federal, State, Local not specified - PPO | Admitting: Anesthesiology

## 2021-08-08 ENCOUNTER — Encounter (HOSPITAL_COMMUNITY): Payer: Self-pay | Admitting: Surgery

## 2021-08-08 ENCOUNTER — Encounter (HOSPITAL_COMMUNITY)
Admission: RE | Disposition: A | Discharge status: Home / Self Care | Payer: Self-pay | Source: Ambulatory Visit | Attending: Surgery

## 2021-08-08 ENCOUNTER — Other Ambulatory Visit: Payer: Self-pay

## 2021-08-08 ENCOUNTER — Ambulatory Visit (HOSPITAL_COMMUNITY)
Admission: RE | Admit: 2021-08-08 | Discharge: 2021-08-08 | Disposition: A | Discharge status: Home / Self Care | Payer: Federal, State, Local not specified - PPO | Source: Ambulatory Visit | Attending: Surgery | Admitting: Surgery

## 2021-08-08 DIAGNOSIS — D176 Benign lipomatous neoplasm of spermatic cord: Secondary | ICD-10-CM | POA: Insufficient documentation

## 2021-08-08 DIAGNOSIS — I10 Essential (primary) hypertension: Secondary | ICD-10-CM | POA: Insufficient documentation

## 2021-08-08 DIAGNOSIS — J45909 Unspecified asthma, uncomplicated: Secondary | ICD-10-CM | POA: Insufficient documentation

## 2021-08-08 DIAGNOSIS — K4090 Unilateral inguinal hernia, without obstruction or gangrene, not specified as recurrent: Secondary | ICD-10-CM | POA: Diagnosis not present

## 2021-08-08 DIAGNOSIS — K219 Gastro-esophageal reflux disease without esophagitis: Secondary | ICD-10-CM | POA: Diagnosis not present

## 2021-08-08 DIAGNOSIS — Z79899 Other long term (current) drug therapy: Secondary | ICD-10-CM | POA: Insufficient documentation

## 2021-08-08 HISTORY — DX: Unilateral inguinal hernia, without obstruction or gangrene, not specified as recurrent: K40.90

## 2021-08-08 HISTORY — PX: INGUINAL HERNIA REPAIR: SHX194

## 2021-08-08 HISTORY — DX: Personal history of urinary calculi: Z87.442

## 2021-08-08 LAB — CBC
HCT: 41.1 % (ref 39.0–52.0)
Hemoglobin: 13.9 g/dL (ref 13.0–17.0)
MCH: 30.2 pg (ref 26.0–34.0)
MCHC: 33.8 g/dL (ref 30.0–36.0)
MCV: 89.2 fL (ref 80.0–100.0)
Platelets: 249 10*3/uL (ref 150–400)
RBC: 4.61 MIL/uL (ref 4.22–5.81)
RDW: 14.1 % (ref 11.5–15.5)
WBC: 7.5 10*3/uL (ref 4.0–10.5)
nRBC: 0 % (ref 0.0–0.2)

## 2021-08-08 LAB — BASIC METABOLIC PANEL
Anion gap: 7 (ref 5–15)
BUN: 21 mg/dL (ref 8–23)
CO2: 26 mmol/L (ref 22–32)
Calcium: 9.2 mg/dL (ref 8.9–10.3)
Chloride: 106 mmol/L (ref 98–111)
Creatinine, Ser: 1.01 mg/dL (ref 0.61–1.24)
GFR, Estimated: >60 mL/min (ref >60)
Glucose, Bld: 112 mg/dL — ABNORMAL HIGH (ref 70–99)
Potassium: 3.7 mmol/L (ref 3.5–5.1)
Sodium: 139 mmol/L (ref 135–145)

## 2021-08-08 SURGERY — REPAIR, HERNIA, INGUINAL, ADULT
Anesthesia: General | Laterality: Right

## 2021-08-08 MED ORDER — PROPOFOL 10 MG/ML IV BOLUS
INTRAVENOUS | Status: DC | PRN
  Administered 2021-08-08: 200 mg via INTRAVENOUS

## 2021-08-08 MED ORDER — ONDANSETRON HCL 4 MG/2ML IJ SOLN
INTRAMUSCULAR | Status: AC
  Filled 2021-08-08: qty 2

## 2021-08-08 MED ORDER — ORAL CARE MOUTH RINSE: 15.0000 mL | Freq: Once | OROMUCOSAL | Status: AC

## 2021-08-08 MED ORDER — DEXAMETHASONE SODIUM PHOSPHATE 10 MG/ML IJ SOLN
INTRAMUSCULAR | Status: AC
  Filled 2021-08-08: qty 1

## 2021-08-08 MED ORDER — LIDOCAINE HCL (CARDIAC) PF 100 MG/5ML IV SOSY
PREFILLED_SYRINGE | INTRAVENOUS | Status: DC | PRN
  Administered 2021-08-08: 100 mg via INTRAVENOUS

## 2021-08-08 MED ORDER — OXYCODONE HCL 5 MG PO TABS
ORAL_TABLET | ORAL | Status: AC
  Filled 2021-08-08: qty 1

## 2021-08-08 MED ORDER — LIDOCAINE HCL (PF) 2 % IJ SOLN
INTRAMUSCULAR | Status: AC
  Filled 2021-08-08: qty 5

## 2021-08-08 MED ORDER — CHLORHEXIDINE GLUCONATE 0.12 % MT SOLN
15.0000 mL | Freq: Once | OROMUCOSAL | Status: AC
  Administered 2021-08-08: 15 mL via OROMUCOSAL

## 2021-08-08 MED ORDER — FENTANYL CITRATE PF 50 MCG/ML IJ SOSY: 25.0000 ug | PREFILLED_SYRINGE | INTRAMUSCULAR | Status: DC | PRN

## 2021-08-08 MED ORDER — MIDAZOLAM HCL 2 MG/2ML IJ SOLN
INTRAMUSCULAR | Status: AC
  Filled 2021-08-08: qty 2

## 2021-08-08 MED ORDER — ACETAMINOPHEN 160 MG/5ML PO SOLN: 325.0000 mg | ORAL | Status: DC | PRN

## 2021-08-08 MED ORDER — PROPOFOL 10 MG/ML IV BOLUS
INTRAVENOUS | Status: AC
  Filled 2021-08-08: qty 20

## 2021-08-08 MED ORDER — BUPIVACAINE HCL (PF) 0.25 % IJ SOLN
INTRAMUSCULAR | Status: DC | PRN
  Administered 2021-08-08: 15 mL
  Administered 2021-08-08: 10 mL

## 2021-08-08 MED ORDER — LACTATED RINGERS IV SOLN: INTRAVENOUS | Status: DC

## 2021-08-08 MED ORDER — CHLORHEXIDINE GLUCONATE CLOTH 2 % EX PADS: 6.0000 | MEDICATED_PAD | Freq: Once | CUTANEOUS | Status: DC

## 2021-08-08 MED ORDER — CEFAZOLIN SODIUM-DEXTROSE 2-4 GM/100ML-% IV SOLN
2.0000 g | INTRAVENOUS | Status: AC
  Administered 2021-08-08: 2 g via INTRAVENOUS
  Filled 2021-08-08: qty 100

## 2021-08-08 MED ORDER — ONDANSETRON HCL 4 MG/2ML IJ SOLN: 4.0000 mg | Freq: Once | INTRAMUSCULAR | Status: DC | PRN

## 2021-08-08 MED ORDER — ONDANSETRON HCL 4 MG/2ML IJ SOLN
INTRAMUSCULAR | Status: DC | PRN
  Administered 2021-08-08: 4 mg via INTRAVENOUS

## 2021-08-08 MED ORDER — PHENYLEPHRINE 80 MCG/ML (10ML) SYRINGE FOR IV PUSH (FOR BLOOD PRESSURE SUPPORT)
PREFILLED_SYRINGE | INTRAVENOUS | Status: DC | PRN
  Administered 2021-08-08: 160 ug via INTRAVENOUS
  Administered 2021-08-08 (×2): 80 ug via INTRAVENOUS

## 2021-08-08 MED ORDER — MEPERIDINE HCL 50 MG/ML IJ SOLN: 6.2500 mg | INTRAMUSCULAR | Status: DC | PRN

## 2021-08-08 MED ORDER — HYDROCODONE-ACETAMINOPHEN 5-325 MG PO TABS: 1.0000 | ORAL_TABLET | Freq: Four times a day (QID) | ORAL | 0 refills | Status: DC | PRN

## 2021-08-08 MED ORDER — ACETAMINOPHEN 325 MG PO TABS: 325.0000 mg | ORAL_TABLET | ORAL | Status: DC | PRN

## 2021-08-08 MED ORDER — OXYCODONE HCL 5 MG PO TABS
5.0000 mg | ORAL_TABLET | Freq: Once | ORAL | Status: AC | PRN
  Administered 2021-08-08: 5 mg via ORAL

## 2021-08-08 MED ORDER — ROCURONIUM BROMIDE 10 MG/ML (PF) SYRINGE
PREFILLED_SYRINGE | INTRAVENOUS | Status: DC | PRN
  Administered 2021-08-08: 50 mg via INTRAVENOUS

## 2021-08-08 MED ORDER — BUPIVACAINE LIPOSOME 1.3 % IJ SUSP: 20.0000 mL | Freq: Once | INTRAMUSCULAR | Status: DC

## 2021-08-08 MED ORDER — 0.9 % SODIUM CHLORIDE (POUR BTL) OPTIME
TOPICAL | Status: DC | PRN
  Administered 2021-08-08: 1000 mL

## 2021-08-08 MED ORDER — BUPIVACAINE LIPOSOME 1.3 % IJ SUSP
INTRAMUSCULAR | Status: AC
  Filled 2021-08-08: qty 20

## 2021-08-08 MED ORDER — EPHEDRINE 5 MG/ML INJ
INTRAVENOUS | Status: AC
  Filled 2021-08-08: qty 5

## 2021-08-08 MED ORDER — EPHEDRINE SULFATE-NACL 50-0.9 MG/10ML-% IV SOSY
PREFILLED_SYRINGE | INTRAVENOUS | Status: DC | PRN
  Administered 2021-08-08 (×3): 5 mg via INTRAVENOUS

## 2021-08-08 MED ORDER — SUGAMMADEX SODIUM 200 MG/2ML IV SOLN
INTRAVENOUS | Status: DC | PRN
  Administered 2021-08-08: 200 mg via INTRAVENOUS

## 2021-08-08 MED ORDER — BUPIVACAINE LIPOSOME 1.3 % IJ SUSP
INTRAMUSCULAR | Status: DC | PRN
  Administered 2021-08-08: 15 mL

## 2021-08-08 MED ORDER — FENTANYL CITRATE (PF) 250 MCG/5ML IJ SOLN
INTRAMUSCULAR | Status: AC
  Filled 2021-08-08: qty 5

## 2021-08-08 MED ORDER — BUPIVACAINE HCL (PF) 0.25 % IJ SOLN
INTRAMUSCULAR | Status: AC
  Filled 2021-08-08: qty 30

## 2021-08-08 MED ORDER — FENTANYL CITRATE (PF) 250 MCG/5ML IJ SOLN
INTRAMUSCULAR | Status: DC | PRN
  Administered 2021-08-08 (×2): 100 ug via INTRAVENOUS
  Administered 2021-08-08: 50 ug via INTRAVENOUS

## 2021-08-08 MED ORDER — MIDAZOLAM HCL 2 MG/2ML IJ SOLN
INTRAMUSCULAR | Status: DC | PRN
  Administered 2021-08-08: 2 mg via INTRAVENOUS

## 2021-08-08 MED ORDER — DEXAMETHASONE SODIUM PHOSPHATE 10 MG/ML IJ SOLN
INTRAMUSCULAR | Status: DC | PRN
  Administered 2021-08-08: 5 mg via INTRAVENOUS

## 2021-08-08 MED ORDER — OXYCODONE HCL 5 MG/5ML PO SOLN: 5.0000 mg | Freq: Once | ORAL | Status: AC | PRN

## 2021-08-08 SURGICAL SUPPLY — 40 items
BAG COUNTER SPONGE SURGICOUNT (BAG) ×2 IMPLANT
BLADE SURG 15 STRL LF DISP TIS (BLADE) ×1 IMPLANT
BLADE SURG 15 STRL SS (BLADE) ×2
CHLORAPREP W/TINT 26 (MISCELLANEOUS) ×3 IMPLANT
COVER SURGICAL LIGHT HANDLE (MISCELLANEOUS) ×2 IMPLANT
DERMABOND ADVANCED (GAUZE/BANDAGES/DRESSINGS) ×1
DERMABOND ADVANCED .7 DNX12 (GAUZE/BANDAGES/DRESSINGS) IMPLANT
DRAIN PENROSE 0.5X18 (DRAIN) ×2 IMPLANT
DRAPE LAPAROTOMY TRNSV 102X78 (DRAPES) ×2 IMPLANT
DRAPE UTILITY XL STRL (DRAPES) ×2 IMPLANT
ELECT REM PT RETURN 15FT ADLT (MISCELLANEOUS) ×2 IMPLANT
GLOVE BIOGEL PI IND STRL 7.0 (GLOVE) ×1 IMPLANT
GLOVE BIOGEL PI INDICATOR 7.0 (GLOVE) ×1
GLOVE SURG ORTHO 8.0 STRL STRW (GLOVE) ×2 IMPLANT
GLOVE SURG SYN 7.5  E (GLOVE) ×2
GLOVE SURG SYN 7.5 E (GLOVE) ×1 IMPLANT
GLOVE SURG SYN 7.5 PF PI (GLOVE) ×1 IMPLANT
GOWN STRL REUS W/ TWL XL LVL3 (GOWN DISPOSABLE) ×2 IMPLANT
GOWN STRL REUS W/TWL XL LVL3 (GOWN DISPOSABLE) ×4
KIT BASIN OR (CUSTOM PROCEDURE TRAY) ×2 IMPLANT
KIT TURNOVER KIT A (KITS) IMPLANT
MARKER SKIN DUAL TIP RULER LAB (MISCELLANEOUS) ×2 IMPLANT
MESH ULTRAPRO 3X6 7.6X15CM (Mesh General) ×1 IMPLANT
NDL HYPO 25X1 1.5 SAFETY (NEEDLE) ×1 IMPLANT
NEEDLE HYPO 22GX1.5 SAFETY (NEEDLE) ×1 IMPLANT
NEEDLE HYPO 25X1 1.5 SAFETY (NEEDLE) IMPLANT
NS IRRIG 1000ML POUR BTL (IV SOLUTION) ×2 IMPLANT
PACK BASIC VI WITH GOWN DISP (CUSTOM PROCEDURE TRAY) ×2 IMPLANT
PENCIL SMOKE EVACUATOR (MISCELLANEOUS) ×1 IMPLANT
SPIKE FLUID TRANSFER (MISCELLANEOUS) ×2 IMPLANT
SPONGE T-LAP 4X18 ~~LOC~~+RFID (SPONGE) ×6 IMPLANT
STRIP CLOSURE SKIN 1/2X4 (GAUZE/BANDAGES/DRESSINGS) ×2 IMPLANT
SUT MNCRL AB 4-0 PS2 18 (SUTURE) ×2 IMPLANT
SUT NOVA NAB GS-21 0 18 T12 DT (SUTURE) IMPLANT
SUT NOVA NAB GS-22 2 0 T19 (SUTURE) ×4 IMPLANT
SUT SILK 2 0 SH (SUTURE) ×2 IMPLANT
SUT VIC AB 3-0 SH 18 (SUTURE) ×2 IMPLANT
SYR BULB IRRIG 60ML STRL (SYRINGE) ×2 IMPLANT
SYR CONTROL 10ML LL (SYRINGE) ×2 IMPLANT
TOWEL OR 17X26 10 PK STRL BLUE (TOWEL DISPOSABLE) ×2 IMPLANT

## 2021-08-08 NOTE — Interval H&P Note (Signed)
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  The various methods of treatment have been discussed with the patient and family. After consideration of risks, benefits and other options for treatment, the patient has consented to    Procedure(s): OPEN RIGHT INGUINAL HERNIA REPAIR WITH MESH (Right) as a surgical intervention.    The patient's history has been reviewed, patient examined, no change in status, stable for surgery.  I have reviewed the patient's chart and labs.  Questions were answered to the patient's satisfaction.    Armandina Gemma, [facility] [place] practice Office: [place]

## 2021-08-08 NOTE — Anesthesia Preprocedure Evaluation (Addendum)
Anesthesia Evaluation  Patient identified by MRN, date of birth, ID band Patient awake    Reviewed: Allergy & Precautions, H&P , NPO status , Patient's Chart, lab work & pertinent test results  Airway Mallampati: II  TM Distance: >3 FB Neck ROM: full    Dental  (+) Dental Advisory Given, Caps, Teeth Intact, Missing,  Veneers upper front and several caps lower and upper:   Pulmonary asthma ,  Mild asthma   Pulmonary exam normal breath sounds clear to auscultation       Cardiovascular Exercise Tolerance: Good hypertension, Pt. on medications and Pt. on home beta blockers Normal cardiovascular exam Rhythm:regular Rate:Normal     Neuro/Psych  Headaches, negative neurological ROS  negative psych ROS   GI/Hepatic Neg liver ROS, hiatal hernia, GERD  Medicated and Controlled,  Endo/Other  negative endocrine ROS  Renal/GU negative Renal ROS  negative genitourinary   Musculoskeletal  (+) Arthritis , Osteoarthritis,    Abdominal   Peds  Hematology negative hematology ROS (+)   Anesthesia Other Findings   Reproductive/Obstetrics negative OB ROS                            Anesthesia Physical  Anesthesia Plan  ASA: 2  Anesthesia Plan: General   Post-op Pain Management: Tylenol PO (pre-op)* and Celebrex PO (pre-op)*   Induction: Intravenous  PONV Risk Score and Plan: 2 and Ondansetron and Dexamethasone  Airway Management Planned: LMA and Oral ETT  Additional Equipment: None  Intra-op Plan:   Post-operative Plan: Extubation in OR  Informed Consent: I have reviewed the patients History and Physical, chart, labs and discussed the procedure including the risks, benefits and alternatives for the proposed anesthesia with the patient or authorized representative who has indicated his/her understanding and acceptance.     Dental Advisory Given  Plan Discussed with: CRNA and  Anesthesiologist  Anesthesia Plan Comments: (  )        Anesthesia Quick Evaluation

## 2021-08-08 NOTE — Op Note (Signed)
Procedure Note  Pre-operative Diagnosis:  right inguinal hernia  Post-operative Diagnosis: same  Procedure:  Open right inguinal hernia repair with mesh  Surgeon:  Armandina Gemma, MD  Anesthesia:  General  Preparation:  Chlora-prep  Estimated Blood Loss: minimal  Complications:  none  Indications: Patient is self-referred. Patient has a approximately 6 to 7-year history of an intermittent bulge in the right groin. He first experienced discomfort and a small bulge while exercising. This is gradually increased in size. It has become progressively more difficult to reduce. Patient does have discomfort associated with bowel movements. Patient may have had a pediatric hernia repair as an infant. He has had no symptoms on the left side. He has had no other abdominal surgery. Patient is a retired Technical brewer.  Procedure Details  The patient was evaluated in the holding area. All of the patient's questions were answered and the proposed procedure was confirmed. The site of the procedure was properly marked. The patient was taken to the Operating Room, identified by name, and the procedure verified as inguinal hernia repair.  The patient was placed in the supine position and underwent induction of anesthesia. A "Time Out" was performed per routine. The lower abdomen and groin were prepped and draped in the usual aseptic fashion.  After ascertaining that an adequate level of anesthesia had been obtained, an incision was made in the groin with a #10 blade.  Dissection was carried through the subcutaneous tissues and hemostasis obtained with the electrocautery.  A Gelpi retractor was placed for exposure.  The external oblique fascia was incised in line with it's fibers and extended through the external inguinal ring.  The cord structures were dissected out of the inguinal canal and encircled with a Penrose drain.  The floor of the inguinal canal was dissected out.  There was a large direct inguinal hernia  which was adherent to the cord structures.  It was dissected out and reduced.  Floor was repaired with interrupted 0-Novofil sutures, keeping the sac reduced.  The cord was explored and there was no evidence of an indirect sac.  There was a moderate sized lipoma of the cord which was excised and a high ligation performed with a 2-0 Silk suture ligature.  The lipoma was excised and discarded.  A 0-Novofil suture was placed lateral to the cord structures to tighten the internal ring.  The floor of the inguinal canal was reconstructed with Ethicon Ultrapro mesh cut to the appropriate dimensions.  It was secured to the pubic tubercle with a 2-0 Novafil suture and along the inguinal ligament with a running 2-0 Novafil suture.  Mesh was split to accommodate the cord structures.  The superior margin of the mesh was secured to the transversalis and internal oblique musculature with interrupted 2-0 Novafil sutures.  The tails of the mesh were overlapped lateral to the cord structures and secured to the inguinal ligament with interrupted 2-0 Novafil sutures to recreate the internal inguinal ring.  Cord structures were returned to the inguinal canal.  Local anesthetic using a mixture of Exparel and Marcaine was infiltrated throughout the field.  External oblique fascia was closed with interrupted 3-0 Vicryl sutures.  Subcutaneous tissues were closed with interrupted 3-0 Vicryl sutures.  Skin was anesthetized with local anesthetic using a mixture of Exparel and Marcaine, and the skin edges were re-approximated with a running 4-0 Monocryl suture.  Wound was washed and dried and Dermabond was applied.  Instrument, sponge, and needle counts were correct prior to closure and  at the conclusion of the case.  The patient tolerated the procedure well.  The patient was awakened from anesthesia and brought to the recovery room in stable condition.  Armandina Gemma, [facility] Office: [phone]

## 2021-08-08 NOTE — Anesthesia Procedure Notes (Signed)
Procedure Name: Intubation Date/Time: 08/08/2021 10:00 AM  Performed by: Raenette Rover, CRNAPre-anesthesia Checklist: Patient identified, Emergency Drugs available, Suction available and Patient being monitored Patient Re-evaluated:Patient Re-evaluated prior to induction Oxygen Delivery Method: Circle system utilized Preoxygenation: Pre-oxygenation with 100% oxygen Induction Type: IV induction Ventilation: Mask ventilation without difficulty Laryngoscope Size: Mac and 4 Grade View: Grade I Tube type: Oral Tube size: 7.5 mm Number of attempts: 1 Airway Equipment and Method: Stylet Placement Confirmation: ETT inserted through vocal cords under direct vision, positive ETCO2 and breath sounds checked- equal and bilateral Secured at: 21 cm Tube secured with: Tape Dental Injury: Teeth and Oropharynx as per pre-operative assessment

## 2021-08-08 NOTE — Anesthesia Postprocedure Evaluation (Signed)
Anesthesia Post Note  Patient: [name], Dr.  Jule Ser) Performed: OPEN RIGHT INGUINAL HERNIA REPAIR WITH MESH (Right)     Patient location during evaluation: PACU Anesthesia Type: General Level of consciousness: awake and alert Pain management: pain level controlled Vital Signs Assessment: post-procedure vital signs reviewed and stable Respiratory status: spontaneous breathing, nonlabored ventilation, respiratory function stable and patient connected to nasal cannula oxygen Cardiovascular status: blood pressure returned to baseline and stable Postop Assessment: no apparent nausea or vomiting Anesthetic complications: no   No notable events documented.  Last Vitals:  Vitals:   08/08/21 1200 08/08/21 1210  BP: (!) 142/81 (!) 130/98  Pulse: 73 69  Resp: 14 16  Temp:    SpO2: 96% 98%    Last Pain:  Vitals:   08/08/21 1210  TempSrc:   PainSc: 0-No pain                 Beni Turrell

## 2021-08-08 NOTE — Transfer of Care (Signed)
Immediate Anesthesia Transfer of Care Note  Patient: ISEAH PLOUFF, Dr.  Jule Ser) Performed: OPEN RIGHT INGUINAL HERNIA REPAIR WITH MESH (Right)  Patient Location: PACU  Anesthesia Type:General  Level of Consciousness: drowsy  Airway & Oxygen Therapy: Patient Spontanous Breathing and Patient connected to face mask oxygen  Post-op Assessment: Report given to RN and Post -op Vital signs reviewed and stable  Post vital signs: Reviewed and stable  Last Vitals:  Vitals Value Taken Time  BP 124/89 1139  Temp    Pulse 63   Resp 11   SpO2 100%     Last Pain:  Vitals:   08/08/21 0829  TempSrc: Oral  PainSc:       Patients Stated Pain Goal: 1 (38/10/17 5102)  Complications: No notable events documented.

## 2021-08-08 NOTE — Discharge Instructions (Signed)
[facility]  HERNIA REPAIR POST OP INSTRUCTIONS  Always review your discharge instruction sheet given to you by the facility where your surgery was performed.  A  prescription for pain medication may be sent to your pharmacy on discharge.  Take your pain medication as prescribed.  If narcotic pain medicine is not needed, then you may take acetaminophen (Tylenol) or ibuprofen (Advil) as needed.  Take your usually prescribed medications unless otherwise directed.  If you need a refill on your pain medication, please contact your pharmacy.  They will contact our office to request authorization. Prescriptions will not be filled after 5:00 PM daily or on weekends.  You should follow a light diet the first 24 hours after arrival home, such as soup and crackers or toast.  Be sure to include plenty of fluids daily.  Resume your normal diet the day after surgery.  Most patients will experience some swelling and bruising around the surgical site.  Ice packs and reclining will help.  Swelling and bruising can take several days to resolve.   It is common to experience some constipation if taking pain medication after surgery.  Increasing fluid intake and taking a stool softener (such as Colace) will usually help or prevent this problem from occurring.  A mild laxative (Milk of Magnesia or Miralax) should be taken according to package directions if there is no bowel movement after 48 hours.  You will likely have Dermabond (topical glue) over your incisions.  This seals the incisions and allows you to bathe and shower at any time after your surgery.  Glue should remain in place for up to 10 days.  It may be removed after 10 days by pealing off the Dermabond material or using Vaseline or naval jelly to remove.  ACTIVITIES:  You may resume regular (light) daily activities beginning the next day - such as daily self-care, walking, climbing stairs - gradually increasing activities as tolerated.  You  may have sexual intercourse when it is comfortable.  Refrain from any heavy lifting or straining until approved by your doctor.  You may drive when you are no longer taking prescription pain medication, when you can comfortably wear a seatbelt, and when you can safely maneuver your car and apply the brakes.  You should see your doctor in the office for a follow-up appointment approximately 2-3 weeks after your surgery.  Make sure that you call for this appointment within a day or two after you arrive home to insure a convenient appointment time.  WHEN TO CALL YOUR DOCTOR: Fever greater than 101.0 Inability to urinate Persistent nausea and/or vomiting Extreme swelling or bruising Continued bleeding from incision Increased pain, redness, or drainage from the incision  The clinic staff is available to answer your questions during regular business hours.  Please don't hesitate to call and ask to speak to one of the nurses for clinical concerns.  If you have a medical emergency, go to the nearest emergency room or call 911.  A surgeon from [facility] is always on call for the hospital.   [facility] [address], [address], [place], [place]  [address]  [phone] ? [phone] ? FAX [phone] 

## 2021-08-09 ENCOUNTER — Encounter (HOSPITAL_COMMUNITY): Payer: Self-pay | Admitting: Surgery

## 2021-09-11 ENCOUNTER — Encounter (INDEPENDENT_AMBULATORY_CARE_PROVIDER_SITE_OTHER): Payer: Federal, State, Local not specified - PPO | Admitting: Ophthalmology

## 2021-10-09 ENCOUNTER — Other Ambulatory Visit: Payer: Self-pay | Admitting: Family Medicine

## 2021-10-09 DIAGNOSIS — K219 Gastro-esophageal reflux disease without esophagitis: Secondary | ICD-10-CM

## 2021-10-09 DIAGNOSIS — E785 Hyperlipidemia, unspecified: Secondary | ICD-10-CM

## 2021-10-09 DIAGNOSIS — I10 Essential (primary) hypertension: Secondary | ICD-10-CM

## 2021-10-09 DIAGNOSIS — M1A0790 Idiopathic chronic gout, unspecified ankle and foot, without tophus (tophi): Secondary | ICD-10-CM

## 2021-10-09 NOTE — Telephone Encounter (Signed)
I have called the pt to get him scheduled for an appointment. There was no answer so I left a message to call back. He has not been seen since 07/2020 and will need an appt on the books in order to refill the medications.

## 2021-10-10 NOTE — Telephone Encounter (Signed)
I have attempted to call the pt once again to get him scheduled since he has not been seen in over a year. There was no answer so I left a message to call back. We will send in 30 days of medication until he calls back and makes an appointment.

## 2021-10-17 ENCOUNTER — Encounter: Payer: Self-pay | Admitting: Family Medicine

## 2021-10-17 ENCOUNTER — Ambulatory Visit: Payer: Federal, State, Local not specified - PPO | Admitting: Family Medicine

## 2021-10-17 VITALS — BP 132/84 | HR 52 | Temp 97.8°F | Resp 18 | Ht 74.0 in | Wt 208.8 lb

## 2021-10-17 DIAGNOSIS — J4520 Mild intermittent asthma, uncomplicated: Secondary | ICD-10-CM

## 2021-10-17 DIAGNOSIS — K219 Gastro-esophageal reflux disease without esophagitis: Secondary | ICD-10-CM

## 2021-10-17 DIAGNOSIS — I10 Essential (primary) hypertension: Secondary | ICD-10-CM | POA: Diagnosis not present

## 2021-10-17 DIAGNOSIS — E785 Hyperlipidemia, unspecified: Secondary | ICD-10-CM

## 2021-10-17 DIAGNOSIS — M1A0790 Idiopathic chronic gout, unspecified ankle and foot, without tophus (tophi): Secondary | ICD-10-CM

## 2021-10-17 DIAGNOSIS — C61 Malignant neoplasm of prostate: Secondary | ICD-10-CM

## 2021-10-17 DIAGNOSIS — E559 Vitamin D deficiency, unspecified: Secondary | ICD-10-CM | POA: Diagnosis not present

## 2021-10-17 DIAGNOSIS — H3341 Traction detachment of retina, right eye: Secondary | ICD-10-CM

## 2021-10-17 MED ORDER — OMEPRAZOLE 40 MG PO CPDR: 40.0000 mg | DELAYED_RELEASE_CAPSULE | Freq: Every day | ORAL | 1 refills | Status: DC | PRN

## 2021-10-17 MED ORDER — OLMESARTAN MEDOXOMIL-HCTZ 40-12.5 MG PO TABS
1.0000 | ORAL_TABLET | Freq: Every day | ORAL | 1 refills | Status: DC
Start: 2021-10-17 — End: 2021-11-11

## 2021-10-17 MED ORDER — ALLOPURINOL 300 MG PO TABS: 300.0000 mg | ORAL_TABLET | Freq: Every day | ORAL | 1 refills | Status: DC

## 2021-10-17 MED ORDER — FLUTICASONE FUROATE-VILANTEROL 100-25 MCG/ACT IN AEPB: 1.0000 | INHALATION_SPRAY | Freq: Every day | RESPIRATORY_TRACT | 3 refills | Status: DC

## 2021-10-17 MED ORDER — NEBIVOLOL HCL 10 MG PO TABS: 10.0000 mg | ORAL_TABLET | Freq: Every day | ORAL | 1 refills | Status: DC

## 2021-10-17 MED ORDER — ATORVASTATIN CALCIUM 40 MG PO TABS: 40.0000 mg | ORAL_TABLET | Freq: Every morning | ORAL | 1 refills | Status: DC

## 2021-10-17 NOTE — Assessment & Plan Note (Signed)
Per u rology 

## 2021-10-17 NOTE — Assessment & Plan Note (Signed)
Per opth 

## 2021-10-17 NOTE — Patient Instructions (Signed)

## 2021-10-17 NOTE — Progress Notes (Signed)
   Established Patient Office Visit  Subjective   Patient ID: [name], Dr., male    DOB: February 02, 1961  Age: 61 y.o. MRN: [id number]  Chief Complaint  Patient presents with   Hypertension   Hyperlipidemia   Follow-up    HPI    Review of Systems  Constitutional:  Negative for fever and malaise/fatigue.  HENT:  Negative for congestion.   Eyes:  Negative for blurred vision.  Respiratory:  Negative for shortness of breath.   Cardiovascular:  Negative for chest pain, palpitations and leg swelling.  Gastrointestinal:  Negative for abdominal pain, blood in stool and nausea.  Genitourinary:  Negative for dysuria and frequency.  Musculoskeletal:  Negative for falls.  Skin:  Negative for rash.  Neurological:  Negative for dizziness, loss of consciousness and headaches.  Endo/Heme/Allergies:  Negative for environmental allergies.  Psychiatric/Behavioral:  Negative for depression. The patient is not nervous/anxious.      Objective:     There were no vitals taken for this visit.   Physical Exam Vitals and nursing note reviewed.  Constitutional:      Appearance: He is well-developed.  HENT:     Head: Normocephalic and atraumatic.  Eyes:     Pupils: Pupils are equal, round, and reactive to light.  Neck:     Thyroid: No thyromegaly.  Cardiovascular:     Rate and Rhythm: Normal rate and regular rhythm.     Heart sounds: No murmur heard. Pulmonary:     Effort: Pulmonary effort is normal. No respiratory distress.     Breath sounds: Normal breath sounds. No wheezing or rales.  Chest:     Chest wall: No tenderness.  Musculoskeletal:     Cervical back: Normal range of motion and neck supple.     Right hip: Tenderness present. Normal range of motion. Normal strength.     Left hip: Tenderness present. Normal range of motion. Normal strength.     Right foot: Bony tenderness present. No swelling.     Left foot: Bony tenderness present. No swelling.  Skin:    General: Skin is  warm and dry.  Neurological:     Mental Status: He is alert and oriented to person, place, and time.     Gait: Gait normal.  Psychiatric:        Mood and Affect: Mood normal.        Behavior: Behavior normal.        Thought Content: Thought content normal.        Judgment: Judgment normal.     No results found for any visits on 10/17/21.    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    Ann Held, DO

## 2021-10-17 NOTE — Assessment & Plan Note (Signed)
con't uric acid con't allopurinol

## 2021-10-17 NOTE — Assessment & Plan Note (Signed)
Tolerating statin, encouraged heart healthy diet, avoid trans fats, minimize simple carbs and saturated fats. Increase exercise as tolerated 

## 2021-10-17 NOTE — Assessment & Plan Note (Signed)
Well controlled, no changes to meds. Encouraged heart healthy diet such as the DASH diet and exercise as tolerated.  °

## 2021-10-17 NOTE — Progress Notes (Addendum)
Subjective:   By signing my name below, I, Shehryar Baig, attest that this documentation has been prepared under the direction and in the presence of Ann Held, DO  10/17/2021    Patient ID: [name], Dr., male    DOB: [birth year], 61 y.o.   MRN: [id number]  Chief Complaint  Patient presents with   Hypertension   Hyperlipidemia   Follow-up    Hypertension Pertinent negatives include no blurred vision, chest pain, headaches, malaise/fatigue, palpitations or shortness of breath.  Hyperlipidemia Pertinent negatives include no chest pain or shortness of breath.   Patient is in today for a follow up visit.  No new complaints   He continues having double vision at this time but is managing it on his own at this time.  He reports no recent flare up of gout. He continues taking 300 mg allopurinol daily PO and reports no new issues while taking it.  He is not interested in taking the flu vaccine at this time. He is eligible for the shingrix vaccine and is interested in taking it at another time.  He is managing his asthma well at this time.  He reports having a hernia procedure in July 2023.  He is interested in completing lab work at another time. ---  at [facility]    Past Medical History:  Diagnosis Date   BPH (benign prostatic hypertrophy)    GERD (gastroesophageal reflux disease)    H/O hiatal hernia    History of colon polyps    History of kidney stones    Hyperlipidemia    Hypertension    Knee internal derangement    RIGHT   Macular pucker, right eye 07/05/2019   Vitrectomy, removal of silicone oil, membrane peel, repair complex retinal detachment inferonasal right eye  11-30-19   Macular pucker, right eye 07/05/2019   Vitrectomy, removal of silicone oil, membrane peel, repair complex retinal detachment inferonasal right eye  11-30-19   Mild asthma    Nuclear sclerotic cataract of left eye 03/26/2021   Discussion with patient and family that medical reasons  that cataract surgery should be undertaken so as to allow for ultimately complete removal of the anterior hyaloid at time of vitrectomy   Personal history of COVID-19 08/2020   Right inguinal hernia    Vitamin D deficiency    Wears glasses     Past Surgical History:  Procedure Laterality Date   COLONOSCOPY W/ POLYPECTOMY  01/21/2008   ESOPHAGOGASTRODUODENOSCOPY  11/15/2008   EYE SURGERY Bilateral    x6   [facility]   INGUINAL HERNIA REPAIR Right 08/08/2021   Procedure: OPEN RIGHT INGUINAL HERNIA REPAIR WITH MESH;  Surgeon: Armandina Gemma, MD;  Location: [facility];  Service: General;  Laterality: Right;   KNEE ARTHROSCOPY Right X4  last one 2005   KNEE ARTHROSCOPY Right 12/06/2013   Procedure: ARTHROSCOPY RIGHT KNEE WITH DEBRIDMENT AND PARTIAL MEDIAL AND LATERAL MENISCECTOMY AND CHONDRLPLASTY;  Surgeon: [name], MD;  Location: [facility];  Service: Orthopedics;  Laterality: Right;   LUMBAR DISC SURGERY  04/02/2007   L5 -- S1   NEGATIVE SLEEP STUDY  per pt   PARATHYROIDECTOMY  02/04/1987   removal adenoma    Family History  Problem Relation Age of Onset   Hyperlipidemia Mother    Coronary artery disease Mother    Pulmonary embolism Father    Colon cancer Father    Hyperlipidemia Brother    Pancreatic cancer Brother  Social History   Socioeconomic History   Marital status: Married    Spouse name: jan   Number of children: 2   Years of education: Not on file   Highest education level: Not on file  Occupational History   Occupation: physician  Tobacco Use   Smoking status: Never   Smokeless tobacco: Never  Vaping Use   Vaping Use: Never used  Substance and Sexual Activity   Alcohol use: Yes    Alcohol/week: 0.0 standard drinks of alcohol    Comment: social use   Drug use: No   Sexual activity: Yes    Partners: Female  Other Topics Concern   Not on file  Social History Narrative   Not on file   Social Determinants of Health   Financial Resource  Strain: Not on file  Food Insecurity: Not on file  Transportation Needs: Not on file  Physical Activity: Not on file  Stress: Not on file  Social Connections: Not on file  Intimate Partner Violence: Not on file    Outpatient Medications Prior to Visit  Medication Sig Dispense Refill   silodosin (RAPAFLO) 8 MG CAPS capsule Take 8 mg by mouth at bedtime.     solifenacin (VESICARE) 5 MG tablet Take 1 tablet (5 mg total) by mouth daily. 90 tablet 4   VITAMIN D PO Take 2 capsules by mouth daily.     allopurinol (ZYLOPRIM) 300 MG tablet Take 1 tablet (300 mg total) by mouth daily. MUST MAKE OFFICE VISIT FOR MORE REFILLS 30 tablet 0   atorvastatin (LIPITOR) 40 MG tablet Take 1 tablet (40 mg total) by mouth every morning. MUST MAKE OFFICE VISIT FOR MORE REFILLS 30 tablet 0   fluticasone furoate-vilanterol (BREO ELLIPTA) 100-25 MCG/ACT AEPB USE 1 INHALATION ORALLY    DAILY (Patient taking differently: Inhale 1 puff into the lungs daily as needed.) 28 each 1   nebivolol (BYSTOLIC) 10 MG tablet Take 1 tablet (10 mg total) by mouth at bedtime. MUST MAKE OFFICE VISIT FOR MORE REFILLS 30 tablet 0   olmesartan-hydrochlorothiazide (BENICAR HCT) 40-12.5 MG tablet Take 1 tablet by mouth daily. MUST MAKE OFFICE VISIT FOR MORE REFILLS 30 tablet 0   omeprazole (PRILOSEC) 40 MG capsule Take 1 capsule (40 mg total) by mouth daily as needed (heartburn). MUST MAKE OFFICE VISIT FOR MORE REFILLS 30 capsule 0   HYDROcodone-acetaminophen (NORCO/VICODIN) 5-325 MG tablet Take 1-2 tablets by mouth every 6 (six) hours as needed for moderate pain. (Patient not taking: Reported on 10/17/2021) 20 tablet 0   predniSONE (DELTASONE) 10 MG tablet TAKE 3 TABLETS PO QD FOR 3 DAYS THEN TAKE 2 TABLETS PO QD FOR 3 DAYS THEN TAKE 1 TABLET PO QD FOR 3 DAYS THEN TAKE 1/2 TAB PO QD FOR 3 DAYS (Patient not taking: Reported on 08/05/2021) 20 tablet 0   No facility-administered medications prior to visit.    Allergies  Allergen Reactions    Merthiolate [Thimerosal] Rash    Rash--local    Review of Systems  Constitutional:  Negative for fever and malaise/fatigue.  HENT:  Negative for congestion.   Eyes:  Positive for double vision. Negative for blurred vision.  Respiratory:  Negative for shortness of breath.   Cardiovascular:  Negative for chest pain, palpitations and leg swelling.  Gastrointestinal:  Negative for abdominal pain, blood in stool and nausea.  Genitourinary:  Negative for dysuria and frequency.  Musculoskeletal:  Negative for falls.  Skin:  Negative for rash.  Neurological:  Negative for dizziness,  loss of consciousness and headaches.  Endo/Heme/Allergies:  Negative for environmental allergies.  Psychiatric/Behavioral:  Negative for depression. The patient is not nervous/anxious.        Objective:    Physical Exam Vitals and nursing note reviewed.  Constitutional:      General: He is not in acute distress.    Appearance: Normal appearance. He is not ill-appearing.  HENT:     Head: Normocephalic and atraumatic.     Right Ear: External ear normal.     Left Ear: External ear normal.  Eyes:     Extraocular Movements: Extraocular movements intact.     Pupils: Pupils are equal, round, and reactive to light.  Cardiovascular:     Rate and Rhythm: Normal rate and regular rhythm.     Heart sounds: Normal heart sounds. No murmur heard.    No gallop.  Pulmonary:     Effort: Pulmonary effort is normal. No respiratory distress.     Breath sounds: Normal breath sounds. No wheezing or rales.  Skin:    General: Skin is warm and dry.  Neurological:     Mental Status: He is alert and oriented to person, place, and time.  Psychiatric:        Judgment: Judgment normal.     BP 132/84 (BP Location: Left Arm, Patient Position: Sitting, Cuff Size: Normal)   Pulse (!) 52   Temp 97.8 F (36.6 C) (Oral)   Resp 18   Ht '6\' 2"'$  (1.88 m)   Wt 208 lb 12.8 oz (94.7 kg)   SpO2 98%   BMI 26.81 kg/m  Wt Readings from  Last 3 Encounters:  10/17/21 208 lb 12.8 oz (94.7 kg)  08/08/21 199 lb 15.3 oz (90.7 kg)  07/12/20 206 lb 3.2 oz (93.5 kg)    Diabetic Foot Exam - Simple   No data filed    Lab Results  Component Value Date   WBC 7.5 08/08/2021   HGB 13.9 08/08/2021   HCT 41.1 08/08/2021   PLT 249 08/08/2021   GLUCOSE 112 (H) 08/08/2021   CHOL 185 04/10/2020   TRIG 183.0 (H) 04/10/2020   HDL 38.10 (L) 04/10/2020   LDLDIRECT 136.7 06/09/2007   LDLCALC 110 (H) 04/10/2020   ALT 30 04/10/2020   AST 18 04/10/2020   NA 139 08/08/2021   K 3.7 08/08/2021   CL 106 08/08/2021   CREATININE 1.01 08/08/2021   BUN 21 08/08/2021   CO2 26 08/08/2021   TSH 1.51 04/10/2020   PSA 6.61 (H) 04/10/2020   INR 1.0 12/09/2006   HGBA1C 6.1 07/12/2020    Lab Results  Component Value Date   TSH 1.51 04/10/2020   Lab Results  Component Value Date   WBC 7.5 08/08/2021   HGB 13.9 08/08/2021   HCT 41.1 08/08/2021   MCV 89.2 08/08/2021   PLT 249 08/08/2021   Lab Results  Component Value Date   NA 139 08/08/2021   K 3.7 08/08/2021   CO2 26 08/08/2021   GLUCOSE 112 (H) 08/08/2021   BUN 21 08/08/2021   CREATININE 1.01 08/08/2021   BILITOT 0.4 04/10/2020   ALKPHOS 82 04/10/2020   AST 18 04/10/2020   ALT 30 04/10/2020   PROT 6.7 04/10/2020   ALBUMIN 4.0 04/10/2020   CALCIUM 9.2 08/08/2021   ANIONGAP 7 08/08/2021   GFR 72.40 04/10/2020   Lab Results  Component Value Date   CHOL 185 04/10/2020   Lab Results  Component Value Date   HDL 38.10 (L)  04/10/2020   Lab Results  Component Value Date   LDLCALC 110 (H) 04/10/2020   Lab Results  Component Value Date   TRIG 183.0 (H) 04/10/2020   Lab Results  Component Value Date   CHOLHDL 5 04/10/2020   Lab Results  Component Value Date   HGBA1C 6.1 07/12/2020       Assessment & Plan:   Problem List Items Addressed This Visit       Unprioritized   Vitamin D deficiency - Primary    Check labs today      Relevant Orders   VITAMIN D 25  Hydroxy (Vit-D Deficiency, Fractures)   Traction detachment of right retina    Per opth      Prostate cancer [facility])    Per urology      Relevant Medications   allopurinol (ZYLOPRIM) 300 MG tablet   Idiopathic chronic gout of foot without tophus    con't uric acid con't allopurinol        Relevant Medications   allopurinol (ZYLOPRIM) 300 MG tablet   Other Relevant Orders   Uric acid   Hyperlipidemia    Tolerating statin, encouraged heart healthy diet, avoid trans fats, minimize simple carbs and saturated fats. Increase exercise as tolerated      Relevant Medications   atorvastatin (LIPITOR) 40 MG tablet   nebivolol (BYSTOLIC) 10 MG tablet   olmesartan-hydrochlorothiazide (BENICAR HCT) 40-12.5 MG tablet   Other Relevant Orders   CBC with Differential/Platelet   Comprehensive metabolic panel   Lipid panel   TSH   Essential hypertension    Well controlled, no changes to meds. Encouraged heart healthy diet such as the DASH diet and exercise as tolerated.       Relevant Medications   atorvastatin (LIPITOR) 40 MG tablet   nebivolol (BYSTOLIC) 10 MG tablet   olmesartan-hydrochlorothiazide (BENICAR HCT) 40-12.5 MG tablet   Other Relevant Orders   CBC with Differential/Platelet   Comprehensive metabolic panel   Lipid panel   TSH   Other Visit Diagnoses     Gastroesophageal reflux disease, unspecified whether esophagitis present       Relevant Medications   omeprazole (PRILOSEC) 40 MG capsule   Mild intermittent asthma, unspecified whether complicated       Relevant Medications   fluticasone furoate-vilanterol (BREO ELLIPTA) 100-25 MCG/ACT AEPB        Meds ordered this encounter  Medications   atorvastatin (LIPITOR) 40 MG tablet    Sig: Take 1 tablet (40 mg total) by mouth every morning.    Dispense:  90 tablet    Refill:  1   nebivolol (BYSTOLIC) 10 MG tablet    Sig: Take 1 tablet (10 mg total) by mouth at bedtime.    Dispense:  90 tablet    Refill:  1    olmesartan-hydrochlorothiazide (BENICAR HCT) 40-12.5 MG tablet    Sig: Take 1 tablet by mouth daily.    Dispense:  90 tablet    Refill:  1   omeprazole (PRILOSEC) 40 MG capsule    Sig: Take 1 capsule (40 mg total) by mouth daily as needed (heartburn).    Dispense:  90 capsule    Refill:  1   allopurinol (ZYLOPRIM) 300 MG tablet    Sig: Take 1 tablet (300 mg total) by mouth daily.    Dispense:  90 tablet    Refill:  1   fluticasone furoate-vilanterol (BREO ELLIPTA) 100-25 MCG/ACT AEPB    Sig: Inhale 1 puff into the  lungs daily.    Dispense:  3 each    Refill:  3    I, Ann Held, DO, personally preformed the services described in this documentation.  All medical record entries made by the scribe were at my direction and in my presence.  I have reviewed the chart and discharge instructions (if applicable) and agree that the record reflects my personal performance and is accurate and complete. 10/17/2021   I,Shehryar Baig,acting as a scribe for Ann Held, DO.,have documented all relevant documentation on the behalf of Ann Held, DO,as directed by  Ann Held, DO while in the presence of Ann Held, DO.   Ann Held, DO

## 2021-10-17 NOTE — Assessment & Plan Note (Signed)
Check labs today.

## 2021-10-17 NOTE — Addendum Note (Signed)
Addended by: Roma Schanz R on: 10/17/2021 02:54 PM   Modules accepted: Orders

## 2021-11-11 ENCOUNTER — Encounter: Payer: Self-pay | Admitting: Family Medicine

## 2021-11-11 DIAGNOSIS — K219 Gastro-esophageal reflux disease without esophagitis: Secondary | ICD-10-CM

## 2021-11-11 DIAGNOSIS — J4520 Mild intermittent asthma, uncomplicated: Secondary | ICD-10-CM

## 2021-11-11 DIAGNOSIS — E785 Hyperlipidemia, unspecified: Secondary | ICD-10-CM

## 2021-11-11 DIAGNOSIS — M1A0790 Idiopathic chronic gout, unspecified ankle and foot, without tophus (tophi): Secondary | ICD-10-CM

## 2021-11-11 DIAGNOSIS — I10 Essential (primary) hypertension: Secondary | ICD-10-CM

## 2021-11-11 MED ORDER — ALLOPURINOL 300 MG PO TABS: 300.0000 mg | ORAL_TABLET | Freq: Every day | ORAL | 1 refills | Status: DC

## 2021-11-11 MED ORDER — OLMESARTAN MEDOXOMIL-HCTZ 40-12.5 MG PO TABS: 1.0000 | ORAL_TABLET | Freq: Every day | ORAL | 1 refills | Status: DC

## 2021-11-11 MED ORDER — OMEPRAZOLE 40 MG PO CPDR: 40.0000 mg | DELAYED_RELEASE_CAPSULE | Freq: Every day | ORAL | 1 refills | Status: DC | PRN

## 2021-11-11 MED ORDER — NEBIVOLOL HCL 10 MG PO TABS: 10.0000 mg | ORAL_TABLET | Freq: Every day | ORAL | 1 refills | Status: DC

## 2021-11-11 MED ORDER — FLUTICASONE FUROATE-VILANTEROL 100-25 MCG/ACT IN AEPB: 1.0000 | INHALATION_SPRAY | Freq: Every day | RESPIRATORY_TRACT | 3 refills | Status: DC

## 2021-11-11 MED ORDER — ATORVASTATIN CALCIUM 40 MG PO TABS: 40.0000 mg | ORAL_TABLET | Freq: Every morning | ORAL | 1 refills | Status: DC

## 2021-12-16 ENCOUNTER — Encounter (INDEPENDENT_AMBULATORY_CARE_PROVIDER_SITE_OTHER): Payer: Federal, State, Local not specified - PPO | Admitting: Ophthalmology

## 2021-12-18 ENCOUNTER — Other Ambulatory Visit (INDEPENDENT_AMBULATORY_CARE_PROVIDER_SITE_OTHER): Payer: Federal, State, Local not specified - PPO

## 2021-12-18 DIAGNOSIS — M1A0790 Idiopathic chronic gout, unspecified ankle and foot, without tophus (tophi): Secondary | ICD-10-CM | POA: Diagnosis not present

## 2021-12-18 DIAGNOSIS — E785 Hyperlipidemia, unspecified: Secondary | ICD-10-CM | POA: Diagnosis not present

## 2021-12-18 DIAGNOSIS — E559 Vitamin D deficiency, unspecified: Secondary | ICD-10-CM

## 2021-12-18 DIAGNOSIS — I10 Essential (primary) hypertension: Secondary | ICD-10-CM

## 2021-12-18 LAB — CBC WITH DIFFERENTIAL/PLATELET
Basophils Absolute: 0.1 10*3/uL (ref 0.0–0.1)
Basophils Relative: 0.9 % (ref 0.0–3.0)
Eosinophils Absolute: 0.6 10*3/uL (ref 0.0–0.7)
Eosinophils Relative: 9 % — ABNORMAL HIGH (ref 0.0–5.0)
HCT: 44.3 % (ref 39.0–52.0)
Hemoglobin: 14.7 g/dL (ref 13.0–17.0)
Lymphocytes Relative: 32.4 % (ref 12.0–46.0)
Lymphs Abs: 2 10*3/uL (ref 0.7–4.0)
MCHC: 33.3 g/dL (ref 30.0–36.0)
MCV: 89.1 fl (ref 78.0–100.0)
Monocytes Absolute: 0.6 10*3/uL (ref 0.1–1.0)
Monocytes Relative: 9.9 % (ref 3.0–12.0)
Neutro Abs: 3 10*3/uL (ref 1.4–7.7)
Neutrophils Relative %: 47.8 % (ref 43.0–77.0)
Platelets: 262 10*3/uL (ref 150.0–400.0)
RBC: 4.97 Mil/uL (ref 4.22–5.81)
RDW: 15.1 % (ref 11.5–15.5)
WBC: 6.2 10*3/uL (ref 4.0–10.5)

## 2021-12-18 LAB — COMPREHENSIVE METABOLIC PANEL
ALT: 25 U/L (ref 0–53)
AST: 20 U/L (ref 0–37)
Albumin: 4.3 g/dL (ref 3.5–5.2)
Alkaline Phosphatase: 80 U/L (ref 39–117)
BUN: 16 mg/dL (ref 6–23)
CO2: 28 mEq/L (ref 19–32)
Calcium: 9.8 mg/dL (ref 8.4–10.5)
Chloride: 102 mEq/L (ref 96–112)
Creatinine, Ser: 1.08 mg/dL (ref 0.40–1.50)
GFR: 73.14 mL/min (ref >60.00)
Glucose, Bld: 104 mg/dL — ABNORMAL HIGH (ref 70–99)
Potassium: 3.9 mEq/L (ref 3.5–5.1)
Sodium: 140 mEq/L (ref 135–145)
Total Bilirubin: 0.6 mg/dL (ref 0.2–1.2)
Total Protein: 7.2 g/dL (ref 6.0–8.3)

## 2021-12-18 LAB — LIPID PANEL
Cholesterol: 205 mg/dL — ABNORMAL HIGH (ref 0–200)
HDL: 43.8 mg/dL (ref >39.00)
NonHDL: 161.47
Total CHOL/HDL Ratio: 5
Triglycerides: 204 mg/dL — ABNORMAL HIGH (ref 0.0–149.0)
VLDL: 40.8 mg/dL — ABNORMAL HIGH (ref 0.0–40.0)

## 2021-12-18 LAB — TSH: TSH: 2.26 u[IU]/mL (ref 0.35–5.50)

## 2021-12-18 LAB — VITAMIN D 25 HYDROXY (VIT D DEFICIENCY, FRACTURES): VITD: 53.81 ng/mL (ref 30.00–100.00)

## 2021-12-18 LAB — LDL CHOLESTEROL, DIRECT: Direct LDL: 129 mg/dL

## 2021-12-18 LAB — URIC ACID: Uric Acid, Serum: 5.1 mg/dL (ref 4.0–7.8)

## 2022-01-01 ENCOUNTER — Telehealth: Payer: Self-pay | Admitting: *Deleted

## 2022-01-01 NOTE — Patient Outreach (Signed)
  Care Coordination   01/01/2022 Name: [name], Dr. MRN: [id number] DOB: 1960-04-02   Care Coordination Outreach Attempts:  An unsuccessful telephone outreach was attempted today to offer the patient information about available care coordination services as a benefit of their health plan.   Follow Up Plan:  Additional outreach attempts will be made to offer the patient care coordination information and services.   Encounter Outcome:  No Answer   Care Coordination Interventions:  No, not indicated    Raina Mina, RN Care Management Coordinator [place] Office [phone]

## 2022-01-20 DIAGNOSIS — N401 Enlarged prostate with lower urinary tract symptoms: Secondary | ICD-10-CM | POA: Diagnosis not present

## 2022-01-20 DIAGNOSIS — R350 Frequency of micturition: Secondary | ICD-10-CM | POA: Diagnosis not present

## 2022-01-24 ENCOUNTER — Other Ambulatory Visit: Payer: Self-pay | Admitting: Urology

## 2022-01-24 DIAGNOSIS — C61 Malignant neoplasm of prostate: Secondary | ICD-10-CM

## 2022-03-06 ENCOUNTER — Ambulatory Visit
Admission: RE | Admit: 2022-03-06 | Discharge: 2022-03-06 | Disposition: A | Discharge status: Home / Self Care | Payer: Federal, State, Local not specified - PPO | Source: Ambulatory Visit | Attending: Urology | Admitting: Urology

## 2022-03-06 DIAGNOSIS — C61 Malignant neoplasm of prostate: Secondary | ICD-10-CM

## 2022-03-06 DIAGNOSIS — R972 Elevated prostate specific antigen [PSA]: Secondary | ICD-10-CM | POA: Diagnosis not present

## 2022-03-06 MED ORDER — GADOPICLENOL 0.5 MMOL/ML IV SOLN
10.0000 mL | Freq: Once | INTRAVENOUS | Status: AC | PRN
  Administered 2022-03-06: 10 mL via INTRAVENOUS

## 2022-03-28 DIAGNOSIS — H3523 Other non-diabetic proliferative retinopathy, bilateral: Secondary | ICD-10-CM | POA: Diagnosis not present

## 2022-03-28 DIAGNOSIS — H59813 Chorioretinal scars after surgery for detachment, bilateral: Secondary | ICD-10-CM | POA: Diagnosis not present

## 2022-03-28 DIAGNOSIS — H35373 Puckering of macula, bilateral: Secondary | ICD-10-CM | POA: Diagnosis not present

## 2022-03-28 DIAGNOSIS — H04123 Dry eye syndrome of bilateral lacrimal glands: Secondary | ICD-10-CM | POA: Diagnosis not present

## 2022-04-04 ENCOUNTER — Other Ambulatory Visit: Payer: Self-pay | Admitting: Family Medicine

## 2022-04-04 DIAGNOSIS — E785 Hyperlipidemia, unspecified: Secondary | ICD-10-CM

## 2022-04-04 DIAGNOSIS — K219 Gastro-esophageal reflux disease without esophagitis: Secondary | ICD-10-CM

## 2022-04-04 DIAGNOSIS — I10 Essential (primary) hypertension: Secondary | ICD-10-CM

## 2022-04-04 DIAGNOSIS — M1A0790 Idiopathic chronic gout, unspecified ankle and foot, without tophus (tophi): Secondary | ICD-10-CM

## 2022-04-30 ENCOUNTER — Ambulatory Visit: Payer: Federal, State, Local not specified - PPO | Admitting: Urology

## 2022-04-30 ENCOUNTER — Encounter: Payer: Self-pay | Admitting: Urology

## 2022-04-30 VITALS — BP 136/82 | HR 64 | Ht 74.0 in | Wt 200.0 lb

## 2022-04-30 DIAGNOSIS — C61 Malignant neoplasm of prostate: Secondary | ICD-10-CM

## 2022-04-30 DIAGNOSIS — N401 Enlarged prostate with lower urinary tract symptoms: Secondary | ICD-10-CM | POA: Diagnosis not present

## 2022-04-30 DIAGNOSIS — N138 Other obstructive and reflux uropathy: Secondary | ICD-10-CM | POA: Diagnosis not present

## 2022-04-30 LAB — BLADDER SCAN AMB NON-IMAGING

## 2022-04-30 NOTE — Patient Instructions (Signed)

## 2022-04-30 NOTE — H&P (View-Only) (Signed)
 04/30/22 2:36 PM   Renne M Gornick, Dr. 06/22/1960 [id number]  CC: BPH, low risk prostate cancer  HPI: 62-year-old male referred from Dr. Dahlstedt for consideration of HOLEP.  He has a long history of BPH with obstructive urinary symptoms, incomplete emptying, and frequency, nocturia 1-2 times overnight.  He had urodynamics in the future that showed bladder outlet obstruction as well as some mild detrusor overactivity.  He also was diagnosed with a single core of low risk Gleason score 3+3=6 prostate cancer by Dr. Dahlstedt, however repeat biopsy showed no evidence of disease.  He had an MRI in 2021 that showed no suspicious lesions and an 85 g prostate with large median lobe, repeat MRI in February 2024 showed 99 g prostate with a large median lobe, again no suspicious lesions.  PVR today elevated at 150 mL.  He is on Silodsin and and Vesicare.  PSAs have ranged from 4-7.  He is interested in discussing outlet procedures for his obstructive urinary symptoms.   PMH: Past Medical History:  Diagnosis Date   BPH (benign prostatic hypertrophy)    GERD (gastroesophageal reflux disease)    H/O hiatal hernia    History of colon polyps    History of kidney stones    Hyperlipidemia    Hypertension    Knee internal derangement    RIGHT   Macular pucker, right eye 07/05/2019   Vitrectomy, removal of silicone oil, membrane peel, repair complex retinal detachment inferonasal right eye  11-30-19   Macular pucker, right eye 07/05/2019   Vitrectomy, removal of silicone oil, membrane peel, repair complex retinal detachment inferonasal right eye  11-30-19   Mild asthma    Nuclear sclerotic cataract of left eye 03/26/2021   Discussion with patient and family that medical reasons that cataract surgery should be undertaken so as to allow for ultimately complete removal of the anterior hyaloid at time of vitrectomy   Personal history of COVID-19 08/2020   Right inguinal hernia    Vitamin D deficiency     Wears glasses     Surgical History: Past Surgical History:  Procedure Laterality Date   COLONOSCOPY W/ POLYPECTOMY  01/21/2008   ESOPHAGOGASTRODUODENOSCOPY  11/15/2008   EYE SURGERY Bilateral    x6   rankin   INGUINAL HERNIA REPAIR Right 08/08/2021   Procedure: OPEN RIGHT INGUINAL HERNIA REPAIR WITH MESH;  Surgeon: Gerkin, Todd, MD;  Location: [facility];  Service: General;  Laterality: Right;   KNEE ARTHROSCOPY Right X4  last one 2005   KNEE ARTHROSCOPY Right 12/06/2013   Procedure: ARTHROSCOPY RIGHT KNEE WITH DEBRIDMENT AND PARTIAL MEDIAL AND LATERAL MENISCECTOMY AND CHONDRLPLASTY;  Surgeon: Robert Collins, MD;  Location: [facility];  Service: Orthopedics;  Laterality: Right;   LUMBAR DISC SURGERY  04/02/2007   L5 -- S1   NEGATIVE SLEEP STUDY  per pt   PARATHYROIDECTOMY  02/04/1987   removal adenoma     Family History: Family History  Problem Relation Age of Onset   Hyperlipidemia Mother    Coronary artery disease Mother    Pulmonary embolism Father    Colon cancer Father    Hyperlipidemia Brother    Pancreatic cancer Brother     Social History:  reports that he has never smoked. He has never used smokeless tobacco. He reports current alcohol use. He reports that he does not use drugs.  Physical Exam: BP 136/82   Pulse 64   Ht 6' 2" (1.88 m)   Wt 200 lb (90.7   kg)   BMI 25.68 kg/m    Constitutional:  Alert and oriented, No acute distress. Cardiovascular: No clubbing, cyanosis, or edema. Respiratory: Normal respiratory effort, no increased work of breathing. GI: Abdomen is soft, nontender, nondistended, no abdominal masses   Laboratory Data: Urinalysis today benign  Pertinent Imaging: I have personally viewed and interpreted the prostate MRI from February 2024, no suspicious lesions, very large median lobe, volume 99 g.  Assessment & Plan:   62-year-old male with single core of low risk prostate cancer on prior biopsy, reassuring low PSA density,  prostate MRI with no suspicious lesions, 99 g gland with large median lobe, and obstructive urinary symptoms interested in HOLEP.  We discussed the risks and benefits of HoLEP at length.  The procedure requires general anesthesia and takes 1 to 2 hours, and a holmium laser is used to enucleate the prostate and push this tissue into the bladder.  A morcellator is then used to remove this tissue, which is sent for pathology.  The vast majority(>95%) of patients are able to discharge the same day with a catheter in place for 2 to 3 days, and will follow-up in clinic for a voiding trial.  We specifically discussed the risks of bleeding, infection, retrograde ejaculation, temporary urgency and urge incontinence, very low risk of long-term incontinence, urethral stricture/bladder neck contracture, pathologic evaluation of prostate tissue and possible detection of prostate cancer or other malignancy, and possible need for additional procedures.  We also discussed consideration of middle lobe HOLEP alone with lower side effect profile, but he is interested in whole gland HOLEP which I think is reasonable with his significant BPH symptoms and young age.  Schedule HOLEP  Shevy Yaney, MD 04/30/2022  [facility] [address], [address] [place], [place] [address] [phone]   

## 2022-04-30 NOTE — Progress Notes (Signed)
04/30/22 2:36 PM   [name], Dr. Jan 25, 1961 [id number]  CC: BPH, low risk prostate cancer  HPI: 62 year old male referred from Dr. Diona Fanti for consideration of HOLEP.  He has a long history of BPH with obstructive urinary symptoms, incomplete emptying, and frequency, nocturia 1-2 times overnight.  He had urodynamics in the future that showed bladder outlet obstruction as well as some mild detrusor overactivity.  He also was diagnosed with a single core of low risk Gleason score 3+3=6 prostate cancer by Dr. Diona Fanti, however repeat biopsy showed no evidence of disease.  He had an MRI in 2021 that showed no suspicious lesions and an 85 g prostate with large median lobe, repeat MRI in February 2024 showed 99 g prostate with a large median lobe, again no suspicious lesions.  PVR today elevated at 150 mL.  He is on Silodsin and and Vesicare.  PSAs have ranged from 4-7.  He is interested in discussing outlet procedures for his obstructive urinary symptoms.   PMH: Past Medical History:  Diagnosis Date   BPH (benign prostatic hypertrophy)    GERD (gastroesophageal reflux disease)    H/O hiatal hernia    History of colon polyps    History of kidney stones    Hyperlipidemia    Hypertension    Knee internal derangement    RIGHT   Macular pucker, right eye 07/05/2019   Vitrectomy, removal of silicone oil, membrane peel, repair complex retinal detachment inferonasal right eye  11-30-19   Macular pucker, right eye 07/05/2019   Vitrectomy, removal of silicone oil, membrane peel, repair complex retinal detachment inferonasal right eye  11-30-19   Mild asthma    Nuclear sclerotic cataract of left eye 03/26/2021   Discussion with patient and family that medical reasons that cataract surgery should be undertaken so as to allow for ultimately complete removal of the anterior hyaloid at time of vitrectomy   Personal history of COVID-19 08/2020   Right inguinal hernia    Vitamin D deficiency     Wears glasses     Surgical History: Past Surgical History:  Procedure Laterality Date   COLONOSCOPY W/ POLYPECTOMY  01/21/2008   ESOPHAGOGASTRODUODENOSCOPY  11/15/2008   EYE SURGERY Bilateral    x6   rankin   INGUINAL HERNIA REPAIR Right 08/08/2021   Procedure: OPEN RIGHT INGUINAL HERNIA REPAIR WITH MESH;  Surgeon: Armandina Gemma, MD;  Location: [facility];  Service: General;  Laterality: Right;   KNEE ARTHROSCOPY Right X4  last one 2005   KNEE ARTHROSCOPY Right 12/06/2013   Procedure: ARTHROSCOPY RIGHT KNEE WITH DEBRIDMENT AND PARTIAL MEDIAL AND LATERAL MENISCECTOMY AND CHONDRLPLASTY;  Surgeon: [name], MD;  Location: [facility];  Service: Orthopedics;  Laterality: Right;   LUMBAR DISC SURGERY  04/02/2007   L5 -- S1   NEGATIVE SLEEP STUDY  per pt   PARATHYROIDECTOMY  02/04/1987   removal adenoma     Family History: Family History  Problem Relation Age of Onset   Hyperlipidemia Mother    Coronary artery disease Mother    Pulmonary embolism Father    Colon cancer Father    Hyperlipidemia Brother    Pancreatic cancer Brother     Social History:  reports that he has never smoked. He has never used smokeless tobacco. He reports current alcohol use. He reports that he does not use drugs.  Physical Exam: BP 136/82   Pulse 64   Ht 6\' 2"  (1.88 m)   Wt 200 lb (90.7  kg)   BMI 25.68 kg/m    Constitutional:  Alert and oriented, No acute distress. Cardiovascular: No clubbing, cyanosis, or edema. Respiratory: Normal respiratory effort, no increased work of breathing. GI: Abdomen is soft, nontender, nondistended, no abdominal masses   Laboratory Data: Urinalysis today benign  Pertinent Imaging: I have personally viewed and interpreted the prostate MRI from February 2024, no suspicious lesions, very large median lobe, volume 99 g.  Assessment & Plan:   62 year old male with single core of low risk prostate cancer on prior biopsy, reassuring low PSA density,  prostate MRI with no suspicious lesions, 99 g gland with large median lobe, and obstructive urinary symptoms interested in HOLEP.  We discussed the risks and benefits of HoLEP at length.  The procedure requires general anesthesia and takes 1 to 2 hours, and a holmium laser is used to enucleate the prostate and push this tissue into the bladder.  A morcellator is then used to remove this tissue, which is sent for pathology.  The vast majority(>95%) of patients are able to discharge the same day with a catheter in place for 2 to 3 days, and will follow-up in clinic for a voiding trial.  We specifically discussed the risks of bleeding, infection, retrograde ejaculation, temporary urgency and urge incontinence, very low risk of long-term incontinence, urethral stricture/bladder neck contracture, pathologic evaluation of prostate tissue and possible detection of prostate cancer or other malignancy, and possible need for additional procedures.  We also discussed consideration of middle lobe HOLEP alone with lower side effect profile, but he is interested in whole gland HOLEP which I think is reasonable with his significant BPH symptoms and young age.  Schedule HOLEP  Nickolas Madrid, MD 04/30/2022  [facility] [address], [place], [place] [address] [phone]

## 2022-05-01 ENCOUNTER — Other Ambulatory Visit: Payer: Self-pay | Admitting: Urology

## 2022-05-01 DIAGNOSIS — N401 Enlarged prostate with lower urinary tract symptoms: Secondary | ICD-10-CM

## 2022-05-01 LAB — URINALYSIS, COMPLETE
Bilirubin, UA: NEGATIVE
Glucose, UA: NEGATIVE
Ketones, UA: NEGATIVE
Leukocytes,UA: NEGATIVE
Nitrite, UA: NEGATIVE
Protein,UA: NEGATIVE
RBC, UA: NEGATIVE
Specific Gravity, UA: 1.02 (ref 1.005–1.030)
Urobilinogen, Ur: 0.2 mg/dL (ref 0.2–1.0)
pH, UA: 5 (ref 5.0–7.5)

## 2022-05-01 LAB — MICROSCOPIC EXAMINATION: Bacteria, UA: NONE SEEN

## 2022-05-01 NOTE — Progress Notes (Signed)
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

## 2022-05-03 LAB — CULTURE, URINE COMPREHENSIVE

## 2022-05-05 ENCOUNTER — Telehealth: Payer: Self-pay

## 2022-05-05 HISTORY — PX: PROSTATE SURGERY: SHX751

## 2022-05-05 NOTE — Progress Notes (Signed)
   [facility] Surgical Posting Form  Surgery Date: Date: 05/30/2022  Surgeon: Dr. Nickolas Madrid, MD  Inpt ( No  )   Outpt (Yes)   Obs ( No  )   Diagnosis: N40.1, N13.8 Benign Prostatic Hyperplasia with Urinary Obstruction  -CPT: [id number]  Surgery: Holmium Laser Enucleation of the Prostate  Stop Anticoagulations: Yes and also hold ASA  Cardiac/Medical/Pulmonary Clearance needed: no  *Orders entered into EPIC  Date: 05/05/22   *Case booked in [place]  Date: 05/05/22  *Notified pt of Surgery: Date: 05/05/22  PRE-OP UA & CX: yes, obtained in clinic on 04/30/2022  *Placed into Prior Authorization Work Fabio Bering Date: 05/05/22  Assistant/laser/rep:No

## 2022-05-05 NOTE — Telephone Encounter (Signed)
I spoke with Dr. Gwenette Greet. We have discussed possible surgery dates and Friday April 26th, 2024 was agreed upon by all parties. Patient given information about surgery date, what to expect pre-operatively and post operatively.  We discussed that a Pre-Admission Testing office will be calling to set up the pre-op visit that will take place prior to surgery, and that these appointments are typically done over the phone with a Pre-Admissions RN. Informed patient that our office will communicate any additional care to be provided after surgery. Patients questions or concerns were discussed during our call. Advised to call our office should there be any additional information, questions or concerns that arise. Patient verbalized understanding.

## 2022-05-20 ENCOUNTER — Encounter
Admission: RE | Admit: 2022-05-20 | Discharge: 2022-05-20 | Disposition: A | Discharge status: Home / Self Care | Payer: Federal, State, Local not specified - PPO | Source: Ambulatory Visit | Attending: Urology | Admitting: Urology

## 2022-05-20 ENCOUNTER — Other Ambulatory Visit: Payer: Self-pay

## 2022-05-20 DIAGNOSIS — Z0000 Encounter for general adult medical examination without abnormal findings: Secondary | ICD-10-CM

## 2022-05-20 DIAGNOSIS — I10 Essential (primary) hypertension: Secondary | ICD-10-CM

## 2022-05-20 DIAGNOSIS — Z8719 Personal history of other diseases of the digestive system: Secondary | ICD-10-CM

## 2022-05-20 DIAGNOSIS — Z01812 Encounter for preprocedural laboratory examination: Secondary | ICD-10-CM

## 2022-05-20 NOTE — Patient Instructions (Addendum)
Your procedure is scheduled on: 05/30/22 - Friday Report to the Registration Desk on the 1st floor of the [facility]. To find out your arrival time, please call [phone] between 1PM - 3PM on: 05/29/22/- Thursday If your arrival time is 6:00 am, do not arrive before that time as the [facility] entrance doors do not open until 6:00 am.  REMEMBER: Instructions that are not followed completely may result in serious medical risk, up to and including death; or upon the discretion of your surgeon and anesthesiologist your surgery may need to be rescheduled.  Do not eat food or drink any liquids after midnight the night before surgery.  No gum chewing or hard candies.   One week prior to surgery: Stop Anti-inflammatories (NSAIDS) such as Advil, Aleve, Ibuprofen, Motrin, Naproxen, Naprosyn and Aspirin based products such as Excedrin, Goody's Powder, BC Powder.   TAKE ONLY THESE MEDICATIONS THE MORNING OF SURGERY WITH A SIP OF WATER:  omeprazole (PRILOSEC) -(take one the night before and one on the morning of surgery - helps to prevent nausea after surgery.) allopurinol (ZYLOPRIM)  atorvastatin (LIPITOR)  BREO ELLIPTA  solifenacin (VESICARE)    No Alcohol for 24 hours before or after surgery.  No Smoking including e-cigarettes for 24 hours before surgery.  No chewable tobacco products for at least 6 hours before surgery.  No nicotine patches on the day of surgery.  Do not use any "recreational" drugs for at least a week (preferably 2 weeks) before your surgery.  Please be advised that the combination of cocaine and anesthesia may have negative outcomes, up to and including death. If you test positive for cocaine, your surgery will be cancelled.  On the morning of surgery brush your teeth with toothpaste and water, you may rinse your mouth with mouthwash if you wish. Do not swallow any toothpaste or mouthwash.  Do not wear jewelry, make-up, hairpins, clips or nail polish.  Do  not wear lotions, powders, or perfumes.   Do not shave body hair from the neck down 48 hours before surgery.  Contact lenses, hearing aids and dentures may not be worn into surgery.  Do not bring valuables to the hospital. [facility] is not responsible for any missing/lost belongings or valuables.   Notify your doctor if there is any change in your medical condition (cold, fever, infection).  Wear comfortable clothing (specific to your surgery type) to the hospital.  After surgery, you can help prevent lung complications by doing breathing exercises.  Take deep breaths and cough every 1-2 hours. Your doctor may order a device called an Incentive Spirometer to help you take deep breaths. When coughing or sneezing, hold a pillow firmly against your incision with both hands. This is called "splinting." Doing this helps protect your incision. It also decreases belly discomfort.  If you are being admitted to the hospital overnight, leave your suitcase in the car. After surgery it may be brought to your room.  In case of increased patient census, it may be necessary for you, the patient, to continue your postoperative care in the Same Day Surgery department.  If you are being discharged the day of surgery, you will not be allowed to drive home. You will need a responsible individual to drive you home and stay with you for 24 hours after surgery.   If you are taking public transportation, you will need to have a responsible individual with you.  Please call the Pre-admissions Testing Dept. at [phone] if you  have any questions about these instructions.  Surgery Visitation Policy:  Patients having surgery or a procedure may have two visitors.  Children under the age of 63 must have an adult with them who is not the patient.  Inpatient Visitation:    Visiting hours are 7 a.m. to 8 p.m. Up to four visitors are allowed at one time in a patient room. The visitors may rotate out with  other people during the day.  One visitor age 19 or older may stay with the patient overnight and must be in the room by 8 p.m.

## 2022-05-23 ENCOUNTER — Encounter
Admission: RE | Admit: 2022-05-23 | Discharge: 2022-05-23 | Disposition: A | Discharge status: Home / Self Care | Payer: Federal, State, Local not specified - PPO | Source: Ambulatory Visit | Attending: Urology | Admitting: Urology

## 2022-05-23 DIAGNOSIS — I10 Essential (primary) hypertension: Secondary | ICD-10-CM

## 2022-05-23 DIAGNOSIS — Z01812 Encounter for preprocedural laboratory examination: Secondary | ICD-10-CM

## 2022-05-23 DIAGNOSIS — R001 Bradycardia, unspecified: Secondary | ICD-10-CM | POA: Diagnosis not present

## 2022-05-23 DIAGNOSIS — Z01810 Encounter for preprocedural cardiovascular examination: Secondary | ICD-10-CM | POA: Diagnosis not present

## 2022-05-23 DIAGNOSIS — Z01818 Encounter for other preprocedural examination: Secondary | ICD-10-CM | POA: Insufficient documentation

## 2022-05-23 LAB — BASIC METABOLIC PANEL
Anion gap: 9 (ref 5–15)
BUN: 18 mg/dL (ref 8–23)
CO2: 28 mmol/L (ref 22–32)
Calcium: 9.8 mg/dL (ref 8.9–10.3)
Chloride: 103 mmol/L (ref 98–111)
Creatinine, Ser: 1.12 mg/dL (ref 0.61–1.24)
GFR, Estimated: >60 mL/min (ref >60)
Glucose, Bld: 106 mg/dL — ABNORMAL HIGH (ref 70–99)
Potassium: 3.7 mmol/L (ref 3.5–5.1)
Sodium: 140 mmol/L (ref 135–145)

## 2022-05-23 LAB — CBC
HCT: 44.8 % (ref 39.0–52.0)
Hemoglobin: 14.9 g/dL (ref 13.0–17.0)
MCH: 29.9 pg (ref 26.0–34.0)
MCHC: 33.3 g/dL (ref 30.0–36.0)
MCV: 90 fL (ref 80.0–100.0)
Platelets: 250 10*3/uL (ref 150–400)
RBC: 4.98 MIL/uL (ref 4.22–5.81)
RDW: 14.5 % (ref 11.5–15.5)
WBC: 5.9 10*3/uL (ref 4.0–10.5)
nRBC: 0 % (ref 0.0–0.2)

## 2022-05-29 MED ORDER — CEFAZOLIN SODIUM-DEXTROSE 2-4 GM/100ML-% IV SOLN: 2.0000 g | INTRAVENOUS | Status: DC

## 2022-05-29 MED ORDER — CHLORHEXIDINE GLUCONATE 0.12 % MT SOLN
15.0000 mL | Freq: Once | OROMUCOSAL | Status: AC
  Administered 2022-05-30: 15 mL via OROMUCOSAL

## 2022-05-29 MED ORDER — LACTATED RINGERS IV SOLN: INTRAVENOUS | Status: DC

## 2022-05-29 MED ORDER — ORAL CARE MOUTH RINSE: 15.0000 mL | Freq: Once | OROMUCOSAL | Status: AC

## 2022-05-30 ENCOUNTER — Encounter
Admission: RE | Disposition: A | Discharge status: Home / Self Care | Payer: Self-pay | Source: Home / Self Care | Attending: Urology

## 2022-05-30 ENCOUNTER — Ambulatory Visit: Payer: Federal, State, Local not specified - PPO | Admitting: Urgent Care

## 2022-05-30 ENCOUNTER — Other Ambulatory Visit: Payer: Self-pay

## 2022-05-30 ENCOUNTER — Encounter: Payer: Self-pay | Admitting: Urology

## 2022-05-30 ENCOUNTER — Ambulatory Visit: Payer: Federal, State, Local not specified - PPO | Admitting: General Practice

## 2022-05-30 ENCOUNTER — Ambulatory Visit
Admission: RE | Admit: 2022-05-30 | Discharge: 2022-05-30 | Disposition: A | Discharge status: Home / Self Care | Payer: Federal, State, Local not specified - PPO | Attending: Urology | Admitting: Urology

## 2022-05-30 DIAGNOSIS — R350 Frequency of micturition: Secondary | ICD-10-CM | POA: Insufficient documentation

## 2022-05-30 DIAGNOSIS — N3289 Other specified disorders of bladder: Secondary | ICD-10-CM | POA: Diagnosis not present

## 2022-05-30 DIAGNOSIS — I10 Essential (primary) hypertension: Secondary | ICD-10-CM | POA: Insufficient documentation

## 2022-05-30 DIAGNOSIS — Z8546 Personal history of malignant neoplasm of prostate: Secondary | ICD-10-CM | POA: Diagnosis not present

## 2022-05-30 DIAGNOSIS — K449 Diaphragmatic hernia without obstruction or gangrene: Secondary | ICD-10-CM | POA: Diagnosis not present

## 2022-05-30 DIAGNOSIS — R351 Nocturia: Secondary | ICD-10-CM | POA: Insufficient documentation

## 2022-05-30 DIAGNOSIS — Z8616 Personal history of COVID-19: Secondary | ICD-10-CM | POA: Diagnosis not present

## 2022-05-30 DIAGNOSIS — N138 Other obstructive and reflux uropathy: Secondary | ICD-10-CM

## 2022-05-30 DIAGNOSIS — Z79899 Other long term (current) drug therapy: Secondary | ICD-10-CM | POA: Insufficient documentation

## 2022-05-30 DIAGNOSIS — R3914 Feeling of incomplete bladder emptying: Secondary | ICD-10-CM | POA: Diagnosis not present

## 2022-05-30 DIAGNOSIS — N401 Enlarged prostate with lower urinary tract symptoms: Secondary | ICD-10-CM

## 2022-05-30 DIAGNOSIS — K219 Gastro-esophageal reflux disease without esophagitis: Secondary | ICD-10-CM | POA: Diagnosis not present

## 2022-05-30 HISTORY — PX: HOLEP-LASER ENUCLEATION OF THE PROSTATE WITH MORCELLATION: SHX6641

## 2022-05-30 SURGERY — ENUCLEATION, PROSTATE, USING LASER, WITH MORCELLATION
Anesthesia: General | Site: Prostate

## 2022-05-30 MED ORDER — FENTANYL CITRATE (PF) 100 MCG/2ML IJ SOLN
INTRAMUSCULAR | Status: AC
  Filled 2022-05-30: qty 2

## 2022-05-30 MED ORDER — DEXAMETHASONE SODIUM PHOSPHATE 10 MG/ML IJ SOLN
INTRAMUSCULAR | Status: DC | PRN
  Administered 2022-05-30: 10 mg via INTRAVENOUS

## 2022-05-30 MED ORDER — PROPOFOL 10 MG/ML IV BOLUS
INTRAVENOUS | Status: DC | PRN
  Administered 2022-05-30: 150 mg via INTRAVENOUS
  Administered 2022-05-30: 50 mg via INTRAVENOUS

## 2022-05-30 MED ORDER — PHENYLEPHRINE HCL (PRESSORS) 10 MG/ML IV SOLN
INTRAVENOUS | Status: DC | PRN
  Administered 2022-05-30: 80 ug via INTRAVENOUS

## 2022-05-30 MED ORDER — FENTANYL CITRATE (PF) 100 MCG/2ML IJ SOLN
25.0000 ug | INTRAMUSCULAR | Status: DC | PRN
  Administered 2022-05-30: 25 ug via INTRAVENOUS

## 2022-05-30 MED ORDER — ONDANSETRON HCL 4 MG/2ML IJ SOLN
INTRAMUSCULAR | Status: DC | PRN
  Administered 2022-05-30: 4 mg via INTRAVENOUS

## 2022-05-30 MED ORDER — HYDROCODONE-ACETAMINOPHEN 5-325 MG PO TABS: 1.0000 | ORAL_TABLET | Freq: Four times a day (QID) | ORAL | 0 refills | Status: AC | PRN

## 2022-05-30 MED ORDER — DEXAMETHASONE SODIUM PHOSPHATE 10 MG/ML IJ SOLN
INTRAMUSCULAR | Status: AC
  Filled 2022-05-30: qty 1

## 2022-05-30 MED ORDER — ROCURONIUM BROMIDE 100 MG/10ML IV SOLN
INTRAVENOUS | Status: DC | PRN
  Administered 2022-05-30: 10 mg via INTRAVENOUS
  Administered 2022-05-30: 50 mg via INTRAVENOUS

## 2022-05-30 MED ORDER — OXYCODONE HCL 5 MG PO TABS
5.0000 mg | ORAL_TABLET | Freq: Once | ORAL | Status: AC | PRN
  Administered 2022-05-30: 5 mg via ORAL

## 2022-05-30 MED ORDER — CEFAZOLIN SODIUM-DEXTROSE 2-4 GM/100ML-% IV SOLN
INTRAVENOUS | Status: AC
  Filled 2022-05-30: qty 100

## 2022-05-30 MED ORDER — FENTANYL CITRATE (PF) 100 MCG/2ML IJ SOLN
INTRAMUSCULAR | Status: DC | PRN
  Administered 2022-05-30 (×2): 50 ug via INTRAVENOUS

## 2022-05-30 MED ORDER — SODIUM CHLORIDE 0.9 % IR SOLN
Status: DC | PRN
  Administered 2022-05-30: 33000 mL via INTRAVESICAL

## 2022-05-30 MED ORDER — EPHEDRINE SULFATE (PRESSORS) 50 MG/ML IJ SOLN
INTRAMUSCULAR | Status: DC | PRN
  Administered 2022-05-30: 5 mg via INTRAVENOUS
  Administered 2022-05-30: 10 mg via INTRAVENOUS
  Administered 2022-05-30: 5 mg via INTRAVENOUS

## 2022-05-30 MED ORDER — OXYCODONE HCL 5 MG/5ML PO SOLN: 5.0000 mg | Freq: Once | ORAL | Status: AC | PRN

## 2022-05-30 MED ORDER — PROPOFOL 10 MG/ML IV BOLUS
INTRAVENOUS | Status: AC
  Filled 2022-05-30: qty 20

## 2022-05-30 MED ORDER — ACETAMINOPHEN 10 MG/ML IV SOLN
INTRAVENOUS | Status: DC | PRN
  Administered 2022-05-30: 1000 mg via INTRAVENOUS

## 2022-05-30 MED ORDER — LIDOCAINE HCL (PF) 2 % IJ SOLN
INTRAMUSCULAR | Status: AC
  Filled 2022-05-30: qty 5

## 2022-05-30 MED ORDER — OXYCODONE HCL 5 MG PO TABS
ORAL_TABLET | ORAL | Status: AC
  Filled 2022-05-30: qty 1

## 2022-05-30 MED ORDER — SUGAMMADEX SODIUM 200 MG/2ML IV SOLN
INTRAVENOUS | Status: DC | PRN
  Administered 2022-05-30: 200 mg via INTRAVENOUS

## 2022-05-30 MED ORDER — EPHEDRINE 5 MG/ML INJ
INTRAVENOUS | Status: AC
  Filled 2022-05-30: qty 5

## 2022-05-30 MED ORDER — ACETAMINOPHEN 10 MG/ML IV SOLN
INTRAVENOUS | Status: AC
  Filled 2022-05-30: qty 100

## 2022-05-30 MED ORDER — STERILE WATER FOR IRRIGATION IR SOLN
Status: DC | PRN
  Administered 2022-05-30: 1000 mL

## 2022-05-30 MED ORDER — CIPROFLOXACIN IN D5W 400 MG/200ML IV SOLN
INTRAVENOUS | Status: DC | PRN
  Administered 2022-05-30: 400 mg via INTRAVENOUS

## 2022-05-30 MED ORDER — ROCURONIUM BROMIDE 10 MG/ML (PF) SYRINGE
PREFILLED_SYRINGE | INTRAVENOUS | Status: AC
  Filled 2022-05-30: qty 10

## 2022-05-30 MED ORDER — ONDANSETRON HCL 4 MG/2ML IJ SOLN
INTRAMUSCULAR | Status: AC
  Filled 2022-05-30: qty 2

## 2022-05-30 MED ORDER — CHLORHEXIDINE GLUCONATE 0.12 % MT SOLN
OROMUCOSAL | Status: AC
  Filled 2022-05-30: qty 15

## 2022-05-30 MED ORDER — CIPROFLOXACIN IN D5W 400 MG/200ML IV SOLN
INTRAVENOUS | Status: AC
  Filled 2022-05-30: qty 200

## 2022-05-30 MED ORDER — PHENYLEPHRINE 80 MCG/ML (10ML) SYRINGE FOR IV PUSH (FOR BLOOD PRESSURE SUPPORT)
PREFILLED_SYRINGE | INTRAVENOUS | Status: AC
  Filled 2022-05-30: qty 10

## 2022-05-30 MED ORDER — LIDOCAINE HCL (CARDIAC) PF 100 MG/5ML IV SOSY
PREFILLED_SYRINGE | INTRAVENOUS | Status: DC | PRN
  Administered 2022-05-30: 80 mg via INTRAVENOUS

## 2022-05-30 SURGICAL SUPPLY — 37 items
ADAPTER IRRIG TUBE 2 SPIKE SOL (ADAPTER) ×2 IMPLANT
ADPR TBG 2 SPK PMP STRL ASCP (ADAPTER) ×3
BAG DRN LRG CPC RND TRDRP CNTR (MISCELLANEOUS) ×1
BAG DRN RND TRDRP ANRFLXCHMBR (UROLOGICAL SUPPLIES) ×1
BAG URINE DRAIN 2000ML AR STRL (UROLOGICAL SUPPLIES) ×1 IMPLANT
BAG URO DRAIN 4000ML (MISCELLANEOUS) ×1 IMPLANT
CATH FOLEY 3WAY 30CC 24FR (CATHETERS) ×1
CATH URETL OPEN END 4X70 (CATHETERS) ×1 IMPLANT
CATH URTH STD 24FR FL 3W 2 (CATHETERS) ×1 IMPLANT
CONTAINER COLLECT MORCELLATR (MISCELLANEOUS) ×1 IMPLANT
DRAPE UTILITY 15X26 TOWEL STRL (DRAPES) IMPLANT
ELECT BIVAP BIPO 22/24 DONUT (ELECTROSURGICAL)
ELECTRD BIVAP BIPO 22/24 DONUT (ELECTROSURGICAL) IMPLANT
FIBER LASER MOSES 550 DFL (Laser) ×1 IMPLANT
FILTER OVERFLOW MORCELLATOR (FILTER) ×1 IMPLANT
GLOVE BIOGEL PI IND STRL 7.5 (GLOVE) ×1 IMPLANT
GOWN STRL REUS W/ TWL LRG LVL3 (GOWN DISPOSABLE) ×1 IMPLANT
GOWN STRL REUS W/ TWL XL LVL3 (GOWN DISPOSABLE) ×1 IMPLANT
GOWN STRL REUS W/TWL LRG LVL3 (GOWN DISPOSABLE) ×1
GOWN STRL REUS W/TWL XL LVL3 (GOWN DISPOSABLE) ×1
HOLDER FOLEY CATH W/STRAP (MISCELLANEOUS) ×1 IMPLANT
IV NS IRRIG 3000ML ARTHROMATIC (IV SOLUTION) ×5 IMPLANT
KIT TURNOVER CYSTO (KITS) ×1 IMPLANT
MBRN O SEALING YLW 17 FOR INST (MISCELLANEOUS) ×1
MEMBRANE SLNG YLW 17 FOR INST (MISCELLANEOUS) ×1 IMPLANT
MORCELLATOR COLLECT CONTAINER (MISCELLANEOUS) ×1
MORCELLATOR OVERFLOW FILTER (FILTER) ×1
MORCELLATOR ROTATION 4.75 335 (MISCELLANEOUS) ×1 IMPLANT
PACK CYSTO AR (MISCELLANEOUS) ×1 IMPLANT
SET CYSTO W/LG BORE CLAMP LF (SET/KITS/TRAYS/PACK) ×1 IMPLANT
SET IRRIG Y TYPE TUR BLADDER L (SET/KITS/TRAYS/PACK) ×1 IMPLANT
SLEEVE PROTECTION STRL DISP (MISCELLANEOUS) ×2 IMPLANT
SURGILUBE 2OZ TUBE FLIPTOP (MISCELLANEOUS) ×1 IMPLANT
SYR TOOMEY IRRIG 70ML (MISCELLANEOUS) ×1
SYRINGE TOOMEY IRRIG 70ML (MISCELLANEOUS) ×1 IMPLANT
TUBE PUMP MORCELLATOR PIRANHA (TUBING) ×1 IMPLANT
WATER STERILE IRR 1000ML POUR (IV SOLUTION) ×1 IMPLANT

## 2022-05-30 NOTE — Anesthesia Postprocedure Evaluation (Signed)
Anesthesia Post Note  Patient: Barbaraann Share, MD  Procedure(s) Performed: HOLEP-LASER ENUCLEATION OF THE PROSTATE WITH MORCELLATION (Prostate)  Patient location during evaluation: PACU Anesthesia Type: General Level of consciousness: awake and alert Pain management: pain level controlled Vital Signs Assessment: post-procedure vital signs reviewed and stable Respiratory status: spontaneous breathing, nonlabored ventilation, respiratory function stable and patient connected to nasal cannula oxygen Cardiovascular status: blood pressure returned to baseline and stable Postop Assessment: no apparent nausea or vomiting Anesthetic complications: no  No notable events documented.   Last Vitals:  Vitals:   05/30/22 0900 05/30/22 0905  BP: 122/76   Pulse: 68 (!) 59  Resp: 14 (!) 24  Temp:    SpO2: 99% 97%    Last Pain:  Vitals:   05/30/22 0905  PainSc: Asleep                 Stephanie Coup

## 2022-05-30 NOTE — Anesthesia Procedure Notes (Signed)
Procedure Name: Intubation Date/Time: 05/30/2022 7:37 AM  Performed by: Rodney Booze, CRNAPre-anesthesia Checklist: Patient identified, Emergency Drugs available, Suction available and Patient being monitored Patient Re-evaluated:Patient Re-evaluated prior to induction Oxygen Delivery Method: Circle system utilized Preoxygenation: Pre-oxygenation with 100% oxygen Induction Type: IV induction Ventilation: Mask ventilation without difficulty Laryngoscope Size: Miller and 3 Grade View: Grade I Tube type: Oral Tube size: 7.5 mm Number of attempts: 1 Airway Equipment and Method: Stylet and Oral airway Placement Confirmation: ETT inserted through vocal cords under direct vision, positive ETCO2 and breath sounds checked- equal and bilateral Secured at: 23 cm Tube secured with: Tape Dental Injury: Teeth and Oropharynx as per pre-operative assessment

## 2022-05-30 NOTE — Interval H&P Note (Signed)
UROLOGY H&P UPDATE  Agree with prior H&P dated 04/30/2022.  Cardiac: RRR Lungs: CTA bilaterally  Laterality: N/A Procedure: HOLEP  Urine: Culture with 100 colonies Enterococcus, benign UA, suspect contaminant  We discussed the risks and benefits of HoLEP at length.  The procedure requires general anesthesia and takes 1 to 2 hours, and a holmium laser is used to enucleate the prostate and push this tissue into the bladder.  A morcellator is then used to remove this tissue, which is sent for pathology.  The vast majority(>95%) of patients are able to discharge the same day with a catheter in place for 2 to 3 days, and will follow-up in clinic for a voiding trial.  We specifically discussed the risks of bleeding, infection, retrograde ejaculation, temporary urgency and urge incontinence, very low risk of long-term incontinence, urethral stricture/bladder neck contracture, pathologic evaluation of prostate tissue and possible detection of prostate cancer or other malignancy, and possible need for additional procedures.   Sondra Come, MD 05/30/2022

## 2022-05-30 NOTE — Discharge Instructions (Signed)

## 2022-05-30 NOTE — Transfer of Care (Signed)
Immediate Anesthesia Transfer of Care Note  Patient: Jason Share, MD  Procedure(s) Performed: [facility] ENUCLEATION OF THE PROSTATE WITH MORCELLATION (Prostate)  Patient Location: PACU  Anesthesia Type:General  Level of Consciousness: awake, alert , and oriented  Airway & Oxygen Therapy: Patient Spontanous Breathing  Post-op Assessment: Report given to RN and Post -op Vital signs reviewed and stable  Post vital signs: Reviewed and stable  Last Vitals:  Vitals Value Taken Time  BP 121/77 0851  Temp    Pulse 54   Resp 14   SpO2 100     Last Pain:  Vitals:   05/30/22 0623  PainSc: 0-No pain         Complications: No notable events documented.

## 2022-05-30 NOTE — Op Note (Signed)
Date of procedure: 05/30/22  Preoperative diagnosis:  BPH with obstruction  Postoperative diagnosis:  Same  Procedure: HoLEP (Holmium Laser Enucleation of the Prostate)  Surgeon: Legrand Rams, MD  Anesthesia: General  Complications: None  Intraoperative findings:  Large prostate with large median lobe with intravesical protrusion, moderate bladder trabeculations, no suspicious lesions Ureteral orifices and verumontanum intact at conclusion of case, excellent hemostasis  EBL: Minimal  Specimens: Prostate chips  Enucleation time: 29 minutes  Morcellation time: 8 minutes  Intra-op weight: 55g  Drains: 24 French three-way, 60 cc in balloon  Indication: Jason Share, 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  After reviewing the management options for treatment, they elected to proceed with the above surgical procedure(s). We have discussed the potential benefits and risks of the procedure, side effects of the proposed treatment, the likelihood of the patient achieving the goals of the procedure, and any potential problems that might occur during the procedure or recuperation.  We specifically discussed the risks of bleeding, infection, hematuria and clot retention, need for additional procedures, possible overnight hospital stay, temporary urgency and incontinence, rare long-term incontinence, and retrograde ejaculation.  Informed consent has been obtained.   Description of procedure:  The patient was taken to the operating room and general anesthesia was induced.  The patient was placed in the dorsal lithotomy position, prepped and draped in the usual sterile fashion, and preoperative antibiotics(Cipro) were administered.  SCDs were placed for DVT prophylaxis.  A preoperative time-out was performed.   [name] sounds were used to gently dilated the urethra up to 77F. The 55 French continuous flow resectoscope  was inserted into the urethra using the visual obturator  The prostate was large with obstructing lateral lobes and a very large median lobe with intravesical protrusion, high bladder neck. The bladder was thoroughly inspected and notable for moderate trabeculations but no suspicious lesions.  The ureteral orifices were located in orthotopic position.    The laser was set to 2 J and 60 Hz and early apical release was performed by making a circumferential mucosal incision proximal to the sphincter.  A lambda incision was then made proximal to the verumontanum.  The prostate was enucleated en bloc circumferentially into the bladder.  The capsule was examined and laser was used for meticulous hemostasis.    The 62 French resectoscope was then switched out for the 26 French nephroscope and prostate tissue was morcellated(Piranha) and the tissue sent to pathology.  A 24 French three-way catheter was inserted easily with the aid of a catheter guide, and 60 cc were placed in the balloon.  Urine was clear.  The catheter irrigated easily with a Toomey syringe.  CBI was initiated.   The patient tolerated the procedure well without any immediate complications and was extubated and transferred to the recovery room in stable condition.  Urine was clear on fast CBI.  Disposition: Stable to PACU  Plan: Wean CBI in PACU, anticipate discharge home today with Foley removal in clinic in 2-3 days.  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

## 2022-05-30 NOTE — Anesthesia Preprocedure Evaluation (Signed)
Anesthesia Evaluation  Patient identified by MRN, date of birth, ID band Patient awake    Reviewed: Allergy & Precautions, H&P , NPO status , Patient's Chart, lab work & pertinent test results  Airway Mallampati: II  TM Distance: >3 FB Neck ROM: full    Dental  (+) Dental Advisory Given, Caps, Teeth Intact, Missing,  Veneers upper front and several caps lower and upper:   Pulmonary neg pulmonary ROS, neg shortness of breath, asthma  Mild asthma   Pulmonary exam normal breath sounds clear to auscultation       Cardiovascular Exercise Tolerance: Good hypertension, Pt. on medications and Pt. on home beta blockers (-) angina (-) Past MI and (-) CABG negative cardio ROS Normal cardiovascular exam Rhythm:regular Rate:Normal     Neuro/Psych  Headaches negative neurological ROS  negative psych ROS   GI/Hepatic negative GI ROS, Neg liver ROS, hiatal hernia,GERD  Medicated and Controlled,,  Endo/Other  negative endocrine ROS    Renal/GU negative Renal ROS  negative genitourinary   Musculoskeletal  (+) Arthritis , Osteoarthritis,    Abdominal   Peds  Hematology negative hematology ROS (+)   Anesthesia Other Findings Past Medical History: No date: BPH (benign prostatic hypertrophy) No date: GERD (gastroesophageal reflux disease) No date: H/O hiatal hernia No date: History of colon polyps No date: History of kidney stones No date: Hyperlipidemia No date: Hypertension No date: Knee internal derangement     Comment:  RIGHT 07/05/2019: Macular pucker, right eye     Comment:  Vitrectomy, removal of silicone oil, membrane peel,               repair complex retinal detachment inferonasal right eye                11-30-19 07/05/2019: Macular pucker, right eye     Comment:  Vitrectomy, removal of silicone oil, membrane peel,               repair complex retinal detachment inferonasal right eye                11-30-19 No date:  Mild asthma 03/26/2021: Nuclear sclerotic cataract of left eye     Comment:  Discussion with patient and family that medical reasons               that cataract surgery should be undertaken so as to allow              for ultimately complete removal of the anterior hyaloid               at time of vitrectomy 08/2020: Personal history of COVID-19 No date: Right inguinal hernia No date: Vitamin D deficiency No date: Wears glasses  Past Surgical History: 01/21/2008: COLONOSCOPY W/ POLYPECTOMY 11/15/2008: ESOPHAGOGASTRODUODENOSCOPY No date: EYE SURGERY; Bilateral     Comment:  x6   rankin 08/08/2021: INGUINAL HERNIA REPAIR; Right     Comment:  Procedure: OPEN RIGHT INGUINAL HERNIA REPAIR WITH MESH;               Surgeon: Darnell Level, MD;  Location: [facility];  Service:               General;  Laterality: Right; X4  last one 2005: KNEE ARTHROSCOPY; Right 12/06/2013: KNEE ARTHROSCOPY; Right     Comment:  Procedure: ARTHROSCOPY RIGHT KNEE WITH DEBRIDMENT AND               PARTIAL MEDIAL AND LATERAL MENISCECTOMY AND  CHONDRLPLASTY;  Surgeon: Eugenia Mcalpine, MD;  Location:               [facility];  Service: Orthopedics;                Laterality: Right; 04/02/2007: LUMBAR DISC SURGERY     Comment:  L5 -- S1 per pt: NEGATIVE SLEEP STUDY 02/04/1987: PARATHYROIDECTOMY     Comment:  removal adenoma  BMI    Body Mass Index: 25.68 kg/m      Reproductive/Obstetrics negative OB ROS                             Anesthesia Physical Anesthesia Plan  ASA: 2  Anesthesia Plan: General ETT   Post-op Pain Management:    Induction: Intravenous  PONV Risk Score and Plan: 2 and Ondansetron, Dexamethasone and Midazolam  Airway Management Planned: Oral ETT  Additional Equipment: None  Intra-op Plan:   Post-operative Plan: Extubation in OR  Informed Consent: I have reviewed the patients History and Physical, chart, labs and discussed the  procedure including the risks, benefits and alternatives for the proposed anesthesia with the patient or authorized representative who has indicated his/her understanding and acceptance.     Dental Advisory Given  Plan Discussed with: Anesthesiologist, CRNA and Surgeon  Anesthesia Plan Comments: (Patient consented for risks of anesthesia including but not limited to:  - adverse reactions to medications - damage to eyes, teeth, lips or other oral mucosa - nerve damage due to positioning  - sore throat or hoarseness - Damage to heart, brain, nerves, lungs, other parts of body or loss of life  Patient voiced understanding.)        Anesthesia Quick Evaluation

## 2022-06-02 ENCOUNTER — Ambulatory Visit: Payer: Federal, State, Local not specified - PPO | Admitting: Physician Assistant

## 2022-06-02 LAB — SURGICAL PATHOLOGY

## 2022-06-03 ENCOUNTER — Telehealth: Payer: Self-pay | Admitting: Urology

## 2022-06-03 NOTE — Telephone Encounter (Signed)
Urology telephone note:  Doing well post HOLEP, voiding with a strong stream, mild stress incontinence, hematuria resolved  We reviewed his benign pathology.  Has a history of low volume low risk prostate cancer, benign MRI, will check PSA at follow-up  Legrand Rams, MD 06/03/2022

## 2022-07-11 ENCOUNTER — Telehealth: Payer: Self-pay | Admitting: Urology

## 2022-07-11 NOTE — Telephone Encounter (Signed)
LMOM for pt to call office to schedule appt:  He is having some urinary issues, can you offer an appointment with one of the PAs this week for a urine sample?  I can also see him if I have any [phone] spot

## 2022-07-14 NOTE — Telephone Encounter (Signed)
Spoke with pt. Pt. Asked for Wednesday 06/19 at 815am. Scheduled for this appt. Verbalized understanding.

## 2022-07-22 ENCOUNTER — Encounter: Payer: Self-pay | Admitting: Family Medicine

## 2022-07-22 MED ORDER — FLUTICASONE PROPIONATE 50 MCG/ACT NA SUSP: 2.0000 | Freq: Every day | NASAL | 1 refills | Status: DC

## 2022-07-23 ENCOUNTER — Ambulatory Visit: Payer: Federal, State, Local not specified - PPO | Admitting: Urology

## 2022-07-23 ENCOUNTER — Encounter: Payer: Self-pay | Admitting: Urology

## 2022-07-23 VITALS — BP 119/83 | HR 58 | Ht 74.0 in | Wt 200.0 lb

## 2022-07-23 DIAGNOSIS — N138 Other obstructive and reflux uropathy: Secondary | ICD-10-CM | POA: Diagnosis not present

## 2022-07-23 DIAGNOSIS — R399 Unspecified symptoms and signs involving the genitourinary system: Secondary | ICD-10-CM | POA: Diagnosis not present

## 2022-07-23 DIAGNOSIS — N401 Enlarged prostate with lower urinary tract symptoms: Secondary | ICD-10-CM | POA: Diagnosis not present

## 2022-07-23 DIAGNOSIS — Z09 Encounter for follow-up examination after completed treatment for conditions other than malignant neoplasm: Secondary | ICD-10-CM

## 2022-07-23 LAB — URINALYSIS, COMPLETE
Bilirubin, UA: NEGATIVE
Glucose, UA: NEGATIVE
Ketones, UA: NEGATIVE
Nitrite, UA: NEGATIVE
Specific Gravity, UA: 1.015 (ref 1.005–1.030)
Urobilinogen, Ur: 0.2 mg/dL (ref 0.2–1.0)
pH, UA: 5.5 (ref 5.0–7.5)

## 2022-07-23 LAB — MICROSCOPIC EXAMINATION

## 2022-07-23 LAB — BLADDER SCAN AMB NON-IMAGING

## 2022-07-23 MED ORDER — AMOXICILLIN 875 MG PO TABS
875.0000 mg | ORAL_TABLET | Freq: Two times a day (BID) | ORAL | 0 refills | Status: DC
Start: 2022-07-23 — End: 2022-10-20

## 2022-07-23 NOTE — Progress Notes (Signed)
   07/23/2022 8:13 AM   Barbaraann Share, MD 1960/06/07 [id number]  Reason for visit: Follow up HoLEP, urinary symptoms  HPI: 62 year old male followed by Dr. Retta Diones long-term for BPH and very low risk prostate cancer with single core of low risk disease and stable PSA, benign prostate MRI, underwent HOLEP with me on 05/30/2022 for obstructive urinary symptoms.  He originally was doing very well after surgery with 3 to 4 weeks with a very strong stream and no significant symptoms aside from some mild stress incontinence.  Since that time he has developed some split or spraying stream at the beginning of voiding, as well as some dysuria and pressure.  He continues on the [place].  Hematuria has essentially resolved.  Urinalysis today suspicious with 11-30 WBC, 11-30 RBC, moderate bacteria, nitrite negative, 1+ leukocyte.  Will send for culture.Marland Kitchen  PVR normal at 29ml.   On exam, no evidence of stricture at urethral meatus.  Prepped and draped in standard sterile fashion with Betadine, and a 14 [place] Coloplast coud catheter was advanced into the urethra with only minimal resistance, possible very subtle fossa navicularis stricture.  We reviewed possible etiologies of his symptoms including UTI, urethritis, fossa navicularis stricture, postoperative healing.  I recommended amoxicillin 875 mg twice daily x 10 days, will follow-up culture, keep follow-up as scheduled next month  Sondra Come, MD  [facility] [address] [place], [place] [address] [phone]

## 2022-07-27 LAB — CULTURE, URINE COMPREHENSIVE

## 2022-07-31 ENCOUNTER — Other Ambulatory Visit: Payer: Self-pay | Admitting: Family Medicine

## 2022-07-31 ENCOUNTER — Encounter: Payer: Self-pay | Admitting: Family Medicine

## 2022-07-31 DIAGNOSIS — I10 Essential (primary) hypertension: Secondary | ICD-10-CM

## 2022-07-31 DIAGNOSIS — D229 Melanocytic nevi, unspecified: Secondary | ICD-10-CM

## 2022-07-31 DIAGNOSIS — E785 Hyperlipidemia, unspecified: Secondary | ICD-10-CM

## 2022-08-15 ENCOUNTER — Ambulatory Visit (HOSPITAL_COMMUNITY): Payer: Federal, State, Local not specified - PPO

## 2022-08-28 ENCOUNTER — Encounter: Payer: Self-pay | Admitting: Urology

## 2022-08-28 ENCOUNTER — Other Ambulatory Visit: Payer: Self-pay

## 2022-08-28 ENCOUNTER — Ambulatory Visit: Payer: Federal, State, Local not specified - PPO | Admitting: Urology

## 2022-08-28 VITALS — BP 125/78 | HR 62 | Ht 74.0 in | Wt 200.0 lb

## 2022-08-28 DIAGNOSIS — N138 Other obstructive and reflux uropathy: Secondary | ICD-10-CM

## 2022-08-28 DIAGNOSIS — R399 Unspecified symptoms and signs involving the genitourinary system: Secondary | ICD-10-CM

## 2022-08-28 DIAGNOSIS — N401 Enlarged prostate with lower urinary tract symptoms: Secondary | ICD-10-CM

## 2022-08-28 LAB — BLADDER SCAN AMB NON-IMAGING

## 2022-08-28 NOTE — Progress Notes (Signed)
   08/28/2022 11:09 AM   Jason Share, MD 04-03-1960 [id number]  Reason for visit: Follow up HoLEP, urinary symptoms, low risk prostate cancer  HPI: 62 year old male followed by Dr. Retta Diones long-term for BPH and very low risk prostate cancer with single core of low risk disease and stable PSA(~7), benign prostate MRI, underwent HOLEP with me on 05/30/2022 for obstructive urinary symptoms.  He originally was doing very well after surgery with 3 to 4 weeks with a very strong stream and no significant symptoms aside from some mild stress incontinence.  He then developed some split or spraying stream, at our clinic visit on 07/23/2022 felt to have a possible subtle fossa navicularis stricture after passing a 14 [place] Coloplast coud catheter.  He has been catheterizing daily since that point and meeting some minimal to mild resistance.  Urinalysis was also suspicious at that visit and he was treated with a course of Augmentin for possible UTI, culture was ultimately negative.  Overall, he seems to be doing well since that time.  He thinks his urgency and frequency have improved, and are definitely better than before surgery, and force of stream is definitely better.  He still has some intermittent dysuria about 50% of the time.  Nocturia 0-1 overnight.  He continues on Vesicare.  PVR today normal at .  We had another conversation today about postop expectations after HOLEP and differences between BPH, OAB, and overlap.  I think he can taper off the catheterizations to 3 times weekly, then twice, then weekly, then ultimately discontinue over the next month.  Would recommend stopping the Vesicare to see where his urinary symptoms settle.  We discussed options like Myrbetriq or Gemtesa in the future if he has persistent or worsening overactive symptoms.  We also discussed considering cystoscopy if no improvement in the next 1 to 2 months.  PSA today, call with results Discontinue Vesicare, could  consider Myrbetriq or Gemtesa if worsening overactive symptoms in the future Taper and ultimately discontinue CIC of possible distal fossa navicularis stricture RTC 6 weeks for UA and possible cystoscopy if persistent symptoms     Sondra Come, MD  [facility] [address] [place], [place] [address] [phone]

## 2022-08-28 NOTE — Patient Instructions (Addendum)
Okay to stop Vesicare  Decrease catheterizations to 3 times per week, then twice per week, once weekly, then ultimately can discontinue catheterizations at some point in the next 3 to 4 weeks.  If there is any problems please just send me a MyChart message  We will see you back in 10 weeks for a urine test and potentially cystoscopy if you are still having symptoms to evaluate for any scar tissue or other cause of persistent urgency/frequency/dysuria.  Anticipate your symptoms will continue to improve over the next few weeks and months as your bladder adjusts.

## 2022-08-29 ENCOUNTER — Other Ambulatory Visit: Payer: Self-pay | Admitting: Family Medicine

## 2022-08-29 ENCOUNTER — Telehealth: Payer: Self-pay | Admitting: Emergency Medicine

## 2022-08-29 ENCOUNTER — Ambulatory Visit
Admission: RE | Admit: 2022-08-29 | Discharge: 2022-08-29 | Disposition: A | Discharge status: Home / Self Care | Payer: No Typology Code available for payment source | Source: Ambulatory Visit | Attending: Family Medicine | Admitting: Family Medicine

## 2022-08-29 DIAGNOSIS — R918 Other nonspecific abnormal finding of lung field: Secondary | ICD-10-CM

## 2022-08-29 DIAGNOSIS — E785 Hyperlipidemia, unspecified: Secondary | ICD-10-CM

## 2022-08-29 DIAGNOSIS — I10 Essential (primary) hypertension: Secondary | ICD-10-CM

## 2022-08-29 DIAGNOSIS — R931 Abnormal findings on diagnostic imaging of heart and coronary circulation: Secondary | ICD-10-CM

## 2022-08-29 NOTE — Telephone Encounter (Signed)
I need to get an OV for Dr Shelle Iron ASAP. I believe Dr Laury Axon already made a referral. Please put him in any blocked slot I have, nodule or other. Thank you

## 2022-09-12 ENCOUNTER — Other Ambulatory Visit: Payer: Self-pay | Admitting: Family Medicine

## 2022-09-12 DIAGNOSIS — I10 Essential (primary) hypertension: Secondary | ICD-10-CM

## 2022-09-12 DIAGNOSIS — M1A0790 Idiopathic chronic gout, unspecified ankle and foot, without tophus (tophi): Secondary | ICD-10-CM

## 2022-09-12 DIAGNOSIS — E785 Hyperlipidemia, unspecified: Secondary | ICD-10-CM

## 2022-09-12 DIAGNOSIS — K219 Gastro-esophageal reflux disease without esophagitis: Secondary | ICD-10-CM

## 2022-09-23 ENCOUNTER — Encounter: Payer: Self-pay | Admitting: Emergency Medicine

## 2022-09-23 ENCOUNTER — Ambulatory Visit: Payer: Federal, State, Local not specified - PPO | Admitting: Emergency Medicine

## 2022-09-23 VITALS — BP 130/84 | HR 58 | Ht 74.0 in | Wt 205.4 lb

## 2022-09-23 DIAGNOSIS — J4520 Mild intermittent asthma, uncomplicated: Secondary | ICD-10-CM

## 2022-09-23 DIAGNOSIS — J849 Interstitial pulmonary disease, unspecified: Secondary | ICD-10-CM

## 2022-09-23 DIAGNOSIS — R9389 Abnormal findings on diagnostic imaging of other specified body structures: Secondary | ICD-10-CM

## 2022-09-23 LAB — CBC WITH DIFFERENTIAL/PLATELET
Basophils Absolute: 0.1 10*3/uL (ref 0.0–0.1)
Basophils Relative: 1 % (ref 0.0–3.0)
Eosinophils Absolute: 0.5 10*3/uL (ref 0.0–0.7)
Eosinophils Relative: 6.9 % — ABNORMAL HIGH (ref 0.0–5.0)
HCT: 45.5 % (ref 39.0–52.0)
Hemoglobin: 15 g/dL (ref 13.0–17.0)
Lymphocytes Relative: 22.2 % (ref 12.0–46.0)
Lymphs Abs: 1.6 10*3/uL (ref 0.7–4.0)
MCHC: 32.9 g/dL (ref 30.0–36.0)
MCV: 90.5 fl (ref 78.0–100.0)
Monocytes Absolute: 0.5 10*3/uL (ref 0.1–1.0)
Monocytes Relative: 6.8 % (ref 3.0–12.0)
Neutro Abs: 4.6 10*3/uL (ref 1.4–7.7)
Neutrophils Relative %: 63.1 % (ref 43.0–77.0)
Platelets: 275 10*3/uL (ref 150.0–400.0)
RBC: 5.03 Mil/uL (ref 4.22–5.81)
RDW: 14.2 % (ref 11.5–15.5)
WBC: 7.3 10*3/uL (ref 4.0–10.5)

## 2022-09-23 LAB — PSA: PSA: 1.24 ng/mL (ref 0.10–4.00)

## 2022-09-23 NOTE — Assessment & Plan Note (Signed)
Reviewed CT chest with Dr. Shelle Iron.  His infiltrates are and appear bronchovascular pattern.  More suspicious for acute or chronic inflammatory process as opposed to malignancy.  We reviewed the differential diagnosis which includes exposure related inflammatory phenomenon, autoimmune process, eosinophilic pneumonia.  He had a remote aspiration event about a month ago but unclear whether this correlates.  He does have established obstructive lung disease.  We will start by ordering an autoimmune panel, RAST testing, hypersensitivity pneumonitis panel.  I will also check a PSA since he has a history of possible prostate adenocarcinoma in situ (prior biopsies).  His PSA was reassuring after prostate surgery.  We will check a repeat high-resolution CT scan of the chest to see if infiltrates persist, assess interval change.  I will schedule navigational bronchoscopy for mid September.  We will decide whether to proceed depending on lab work and CT results.

## 2022-09-23 NOTE — Assessment & Plan Note (Signed)
Chronic obstructive lung disease and an almost fixed asthma pattern.  He is a never smoker.  May correlate with his interstitial infiltrates on CT but difficult to know whether there is a connection.  He is on Breo.  We will plan to recheck his pulmonary function testing and compare with his priors

## 2022-09-23 NOTE — Progress Notes (Signed)
Subjective:    Patient ID: Jason Share, [name], [name]    DOB: 1960-04-02, 62 y.o.   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  [name] is here today to discuss an abnormal CT scan of the chest.  Past medical history significant for hypertension, macular degeneration/pucker, GERD with a hiatal hernia, BPH w hx elevated PSA > .  [name] has mild asthma that was diagnosed by PFT w obstruction, currently managed on Breo, fluticasone nasal spray. [name] has had 2 flares in the past when [name] had URI's. Never needs albuterol.  [name] underwent a standard cardiac scoring CT chest on 08/29/2022 as below.  This identified CAD as noted, but also showed some bilateral scattered irregular nodular opacities in a somewhat peribronchovascular distribution.  [name] reports that [name] is well. [name] is on [place], but it just went generic - ? Whether this is an important exposure. [name] has allergies and has had hypereosinophilia before. Potential sensitive exposures include pine straw, [name] has not seen mold in the home. [name] feels at baseline, although more evening cough, dry. [name] has had some increased reflux sx as well. [name] had an isolated aspiration event about a month ago. [name] has had arthritis in toes and elbows before that have required pred > intermittent. No crystals on aspiration, mildly elevated uric acid. No rashes.   Cardiac scoring CT chest 08/29/2022 reviewed by me shows bilateral nodularity and architectural distortion that appears to be in a peribronchovascular distribution.  It's most notable in the right middle lobe.  There are no pathologic mediastinal or hilar nodes.  PFT 08/03/2012 reviewed by me. Shows moderate obstruction without a bronchodilator response.  FEV1 74% predicted.    Review of Systems As per HPI  Past Medical History:  Diagnosis Date   BPH (benign prostatic hypertrophy)    GERD (gastroesophageal reflux disease)    H/O hiatal hernia    History of colon polyps     History of kidney stones    Hyperlipidemia    Hypertension    Knee internal derangement    RIGHT   Macular pucker, right eye 07/05/2019   Vitrectomy, removal of silicone oil, membrane peel, repair complex retinal detachment inferonasal right eye  11-30-19   Macular pucker, right eye 07/05/2019   Vitrectomy, removal of silicone oil, membrane peel, repair complex retinal detachment inferonasal right eye  11-30-19   Mild asthma    Nuclear sclerotic cataract of left eye 03/26/2021   Discussion with patient and family that medical reasons that cataract surgery should be undertaken so as to allow for ultimately complete removal of the anterior hyaloid at time of vitrectomy   Personal history of COVID-19 08/2020   Right inguinal hernia    Vitamin D deficiency    Wears glasses      Family History  Problem Relation Age of Onset   Hyperlipidemia Mother    Coronary artery disease Mother    Pulmonary embolism Father    Colon cancer Father    Hyperlipidemia Brother    Pancreatic cancer Brother      Social History   Socioeconomic History   Marital status: Married    Spouse name: jan   Number of children: 2   Years of education: Not on file   Highest education level: Not on file  Occupational History   Occupation: physician  Tobacco Use   Smoking status: Never    Passive exposure: Never  Smokeless tobacco: Never  Vaping Use   Vaping status: Never Used  Substance and Sexual Activity   Alcohol use: Yes    Alcohol/week: 0.0 standard drinks of alcohol    Comment: social use   Drug use: No   Sexual activity: Yes    Partners: Female  Other Topics Concern   Not on file  Social History Narrative   Not on file   Social Determinants of Health   Financial Resource Strain: Not on file  Food Insecurity: Not on file  Transportation Needs: Not on file  Physical Activity: Not on file  Stress: Not on file  Social Connections: Not on file  Intimate Partner Violence: Not on file     [place], [place], [place]   Allergies  Allergen Reactions   Merthiolate [Thimerosal (Thiomersal)] Rash    Rash--local     Outpatient Medications Prior to Visit  Medication Sig Dispense Refill   allopurinol (ZYLOPRIM) 300 MG tablet TAKE 1 TABLET DAILY 90 tablet 0   atorvastatin (LIPITOR) 40 MG tablet TAKE 1 TABLET EVERY MORNING 90 tablet 0   fluticasone (FLONASE) 50 MCG/ACT nasal spray Place 2 sprays into both nostrils daily. 48 g 1   fluticasone furoate-vilanterol (BREO ELLIPTA) 100-25 MCG/ACT AEPB Inhale 1 puff into the lungs daily. 3 each 3   ibuprofen (ADVIL) 600 MG tablet Take 600 mg by mouth every 6 (six) hours as needed.     nebivolol (BYSTOLIC) 10 MG tablet TAKE 1 TABLET AT BEDTIME 90 tablet 0   olmesartan-hydrochlorothiazide (BENICAR HCT) 40-12.5 MG tablet TAKE 1 TABLET DAILY 90 tablet 0   omeprazole (PRILOSEC) 40 MG capsule TAKE 1 CAPSULE DAILY AS    NEEDED FOR HEARTBURN 90 capsule 0   VITAMIN D PO Take 2 capsules by mouth daily.     amoxicillin (AMOXIL) 875 MG tablet Take 1 tablet (875 mg total) by mouth every 12 (twelve) hours. (Patient not taking: Reported on 09/23/2022) 20 tablet 0   No facility-administered medications prior to visit.        Objective:   Physical Exam Vitals:   09/23/22 1300  BP: 130/84  Pulse: (!) 58  SpO2: 98%  Weight: 205 lb 6.4 oz (93.2 kg)  Height: 6\' 2"  (1.88 m)    Gen: Pleasant, well-nourished, in no distress,  normal affect  ENT: No lesions,  mouth clear,  oropharynx clear, no postnasal drip  Neck: No JVD, no stridor  Lungs: No use of accessory muscles, no crackles or wheezing on normal respiration, no wheeze on forced expiration  Cardiovascular: RRR, heart sounds normal, no murmur or gallops, no peripheral edema  Musculoskeletal: No deformities, no cyanosis or clubbing  Neuro: alert, awake, non focal  Skin: Warm, no lesions or rash      Assessment & Plan:  Abnormal CT of the chest Reviewed CT chest with [name].  His infiltrates  are and appear bronchovascular pattern.  More suspicious for acute or chronic inflammatory process as opposed to malignancy.  We reviewed the differential diagnosis which includes exposure related inflammatory phenomenon, autoimmune process, eosinophilic pneumonia.  [name] had a remote aspiration event about a month ago but unclear whether this correlates.  [name] does have established obstructive lung disease.  We will start by ordering an autoimmune panel, RAST testing, hypersensitivity pneumonitis panel.  I will also check a PSA since [name] has a history of possible prostate adenocarcinoma in situ (prior biopsies).  His PSA was reassuring after prostate surgery.  We will check a repeat high-resolution CT  scan of the chest to see if infiltrates persist, assess interval change.  I will schedule navigational bronchoscopy for mid September.  We will decide whether to proceed depending on lab work and CT results.   Levy Pupa, MD, PhD 09/23/2022, 2:08 PM [facility] [phone] or if no answer before 7:00PM call [phone] For any issues after 7:00PM please call eLink [phone]

## 2022-09-23 NOTE — Patient Instructions (Addendum)
We reviewed your CT scan of the chest today. We will perform lab work today We will arrange for pulmonary function testing We will repeat a high-resolution CT scan of the chest to be done in early September. We will tentatively schedule bronchoscopy to be done mid-September.  Depending on your CT results, lab results we will decide whether this should be postponed or rescheduled Follow with Dr. Delton Coombes next available opening after the testing

## 2022-09-23 NOTE — H&P (View-Only) (Signed)
Subjective:    Patient ID: Jason Share, MD, male    DOB: 1961-02-01, 62 y.o.   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  He is here today to discuss an abnormal CT scan of the chest.  Past medical history significant for hypertension, macular degeneration/pucker, GERD with a hiatal hernia, BPH w hx elevated PSA > .  He has mild asthma that was diagnosed by PFT w obstruction, currently managed on Breo, fluticasone nasal spray. He has had 2 flares in the past when he had URI's. Never needs albuterol.  [name] underwent a standard cardiac scoring CT chest on 08/29/2022 as below.  This identified CAD as noted, but also showed some bilateral scattered irregular nodular opacities in a somewhat peribronchovascular distribution.  He reports that he is well. He is on Ten Sleep, but it just went generic - ? Whether this is an important exposure. He has allergies and has had hypereosinophilia before. Potential sensitive exposures include pine straw, he has not seen mold in the home. He feels at baseline, although more evening cough, dry. He has had some increased reflux sx as well. He had an isolated aspiration event about a month ago. He has had arthritis in toes and elbows before that have required pred > intermittent. No crystals on aspiration, mildly elevated uric acid. No rashes.   Cardiac scoring CT chest 08/29/2022 reviewed by me shows bilateral nodularity and architectural distortion that appears to be in a peribronchovascular distribution.  It's most notable in the right middle lobe.  There are no pathologic mediastinal or hilar nodes.  PFT 08/03/2012 reviewed by me. Shows moderate obstruction without a bronchodilator response.  FEV1 74% predicted.    Review of Systems As per HPI  Past Medical History:  Diagnosis Date   BPH (benign prostatic hypertrophy)    GERD (gastroesophageal reflux disease)    H/O hiatal hernia    History of colon polyps     History of kidney stones    Hyperlipidemia    Hypertension    Knee internal derangement    RIGHT   Macular pucker, right eye 07/05/2019   Vitrectomy, removal of silicone oil, membrane peel, repair complex retinal detachment inferonasal right eye  11-30-19   Macular pucker, right eye 07/05/2019   Vitrectomy, removal of silicone oil, membrane peel, repair complex retinal detachment inferonasal right eye  11-30-19   Mild asthma    Nuclear sclerotic cataract of left eye 03/26/2021   Discussion with patient and family that medical reasons that cataract surgery should be undertaken so as to allow for ultimately complete removal of the anterior hyaloid at time of vitrectomy   Personal history of COVID-19 08/2020   Right inguinal hernia    Vitamin D deficiency    Wears glasses      Family History  Problem Relation Age of Onset   Hyperlipidemia Mother    Coronary artery disease Mother    Pulmonary embolism Father    Colon cancer Father    Hyperlipidemia Brother    Pancreatic cancer Brother      Social History   Socioeconomic History   Marital status: Married    Spouse name: jan   Number of children: 2   Years of education: Not on file   Highest education level: Not on file  Occupational History   Occupation: physician  Tobacco Use   Smoking status: Never    Passive exposure: Never  Smokeless tobacco: Never  Vaping Use   Vaping status: Never Used  Substance and Sexual Activity   Alcohol use: Yes    Alcohol/week: 0.0 standard drinks of alcohol    Comment: social use   Drug use: No   Sexual activity: Yes    Partners: Female  Other Topics Concern   Not on file  Social History Narrative   Not on file   Social Determinants of Health   Financial Resource Strain: Not on file  Food Insecurity: Not on file  Transportation Needs: Not on file  Physical Activity: Not on file  Stress: Not on file  Social Connections: Not on file  Intimate Partner Violence: Not on file     [place], [place], [place]   Allergies  Allergen Reactions   Merthiolate [Thimerosal (Thiomersal)] Rash    Rash--local     Outpatient Medications Prior to Visit  Medication Sig Dispense Refill   allopurinol (ZYLOPRIM) 300 MG tablet TAKE 1 TABLET DAILY 90 tablet 0   atorvastatin (LIPITOR) 40 MG tablet TAKE 1 TABLET EVERY MORNING 90 tablet 0   fluticasone (FLONASE) 50 MCG/ACT nasal spray Place 2 sprays into both nostrils daily. 48 g 1   fluticasone furoate-vilanterol (BREO ELLIPTA) 100-25 MCG/ACT AEPB Inhale 1 puff into the lungs daily. 3 each 3   ibuprofen (ADVIL) 600 MG tablet Take 600 mg by mouth every 6 (six) hours as needed.     nebivolol (BYSTOLIC) 10 MG tablet TAKE 1 TABLET AT BEDTIME 90 tablet 0   olmesartan-hydrochlorothiazide (BENICAR HCT) 40-12.5 MG tablet TAKE 1 TABLET DAILY 90 tablet 0   omeprazole (PRILOSEC) 40 MG capsule TAKE 1 CAPSULE DAILY AS    NEEDED FOR HEARTBURN 90 capsule 0   VITAMIN D PO Take 2 capsules by mouth daily.     amoxicillin (AMOXIL) 875 MG tablet Take 1 tablet (875 mg total) by mouth every 12 (twelve) hours. (Patient not taking: Reported on 09/23/2022) 20 tablet 0   No facility-administered medications prior to visit.        Objective:   Physical Exam Vitals:   09/23/22 1300  BP: 130/84  Pulse: (!) 58  SpO2: 98%  Weight: 205 lb 6.4 oz (93.2 kg)  Height: 6\' 2"  (1.88 m)    Gen: Pleasant, well-nourished, in no distress,  normal affect  ENT: No lesions,  mouth clear,  oropharynx clear, no postnasal drip  Neck: No JVD, no stridor  Lungs: No use of accessory muscles, no crackles or wheezing on normal respiration, no wheeze on forced expiration  Cardiovascular: RRR, heart sounds normal, no murmur or gallops, no peripheral edema  Musculoskeletal: No deformities, no cyanosis or clubbing  Neuro: alert, awake, non focal  Skin: Warm, no lesions or rash      Assessment & Plan:  Abnormal CT of the chest Reviewed CT chest with Dr. Shelle Iron.  His infiltrates  are and appear bronchovascular pattern.  More suspicious for acute or chronic inflammatory process as opposed to malignancy.  We reviewed the differential diagnosis which includes exposure related inflammatory phenomenon, autoimmune process, eosinophilic pneumonia.  He had a remote aspiration event about a month ago but unclear whether this correlates.  He does have established obstructive lung disease.  We will start by ordering an autoimmune panel, RAST testing, hypersensitivity pneumonitis panel.  I will also check a PSA since he has a history of possible prostate adenocarcinoma in situ (prior biopsies).  His PSA was reassuring after prostate surgery.  We will check a repeat high-resolution CT  scan of the chest to see if infiltrates persist, assess interval change.  I will schedule navigational bronchoscopy for mid September.  We will decide whether to proceed depending on lab work and CT results.   Jason Pupa, MD, PhD 09/23/2022, 2:08 PM [facility] [phone] or if no answer before 7:00PM call [phone] For any issues after 7:00PM please call eLink [phone]

## 2022-09-25 LAB — SJOGRENS SYNDROME-B EXTRACTABLE NUCLEAR ANTIBODY: SSB (La) (ENA) Antibody, IgG: <1 AI

## 2022-09-25 LAB — QUANTIFERON-TB GOLD PLUS
Mitogen-NIL: 7.08 [IU]/mL
NIL: 0.02 [IU]/mL
QuantiFERON-TB Gold Plus: NEGATIVE
TB1-NIL: 0 [IU]/mL
TB2-NIL: 0 [IU]/mL

## 2022-09-25 LAB — SJOGRENS SYNDROME-A EXTRACTABLE NUCLEAR ANTIBODY: SSA (Ro) (ENA) Antibody, IgG: <1 AI

## 2022-09-25 LAB — RHEUMATOID FACTOR: Rheumatoid fact SerPl-aCnc: <10 [IU]/mL (ref <14)

## 2022-09-25 LAB — ANA: Anti Nuclear Antibody (ANA): NEGATIVE

## 2022-09-25 LAB — ANCA SCREEN W REFLEX TITER: ANCA SCREEN: NEGATIVE

## 2022-09-25 LAB — ANTI-SCLERODERMA ANTIBODY: Scleroderma (Scl-70) (ENA) Antibody, IgG: <1 AI

## 2022-09-26 ENCOUNTER — Telehealth: Payer: Self-pay | Admitting: Emergency Medicine

## 2022-09-26 NOTE — Telephone Encounter (Signed)
Called [name] to get him scheduled from his recall for PFT. [name] is scheduled for first avail at [facility] for Oct 7th @ 8:30 am. He would like to just have a nurse reach out to him to review the results after PFT due to Dr. Delton Coombes not being in between the 4th-17th of Oct, [name] doesn't want to wait for result

## 2022-09-27 LAB — ALLERGEN PROFILE, PERENNIAL ALLERGEN IGE

## 2022-09-27 LAB — HYPERSENSITIVITY PNEUMONITIS
A. Pullulans Abs: NEGATIVE
A.Fumigatus #1 Abs: NEGATIVE
Micropolyspora faeni, IgG: NEGATIVE
Pigeon Serum Abs: NEGATIVE
Thermoact. Saccharii: NEGATIVE
Thermoactinomyces vulgaris, IgG: NEGATIVE

## 2022-09-27 LAB — ANA+ENA+DNA/DS+ANTICH+CENTR
ANA Titer 1: NEGATIVE
Anti JO-1: <0.2 AI (ref 0.0–0.9)
Centromere Ab Screen: <0.2 AI (ref 0.0–0.9)
Chromatin Ab SerPl-aCnc: <0.2 AI (ref 0.0–0.9)
ENA RNP Ab: <0.2 AI (ref 0.0–0.9)
ENA SM Ab Ser-aCnc: <0.2 AI (ref 0.0–0.9)
ENA SSA (RO) Ab: <0.2 AI (ref 0.0–0.9)
ENA SSB (LA) Ab: <0.2 AI (ref 0.0–0.9)
Scleroderma (Scl-70) (ENA) Antibody, IgG: <0.2 AI (ref 0.0–0.9)
dsDNA Ab: 1 [IU]/mL (ref 0–9)

## 2022-10-01 DIAGNOSIS — H3523 Other non-diabetic proliferative retinopathy, bilateral: Secondary | ICD-10-CM | POA: Diagnosis not present

## 2022-10-01 DIAGNOSIS — H59813 Chorioretinal scars after surgery for detachment, bilateral: Secondary | ICD-10-CM | POA: Diagnosis not present

## 2022-10-01 DIAGNOSIS — H04123 Dry eye syndrome of bilateral lacrimal glands: Secondary | ICD-10-CM | POA: Diagnosis not present

## 2022-10-01 DIAGNOSIS — H35373 Puckering of macula, bilateral: Secondary | ICD-10-CM | POA: Diagnosis not present

## 2022-10-09 ENCOUNTER — Ambulatory Visit
Admission: RE | Admit: 2022-10-09 | Discharge: 2022-10-09 | Disposition: A | Discharge status: Home / Self Care | Payer: Federal, State, Local not specified - PPO | Source: Ambulatory Visit | Attending: Emergency Medicine | Admitting: Emergency Medicine

## 2022-10-09 DIAGNOSIS — J849 Interstitial pulmonary disease, unspecified: Secondary | ICD-10-CM

## 2022-10-09 DIAGNOSIS — I2510 Atherosclerotic heart disease of native coronary artery without angina pectoris: Secondary | ICD-10-CM | POA: Diagnosis not present

## 2022-10-09 DIAGNOSIS — I700 Atherosclerosis of aorta: Secondary | ICD-10-CM | POA: Diagnosis not present

## 2022-10-09 DIAGNOSIS — R918 Other nonspecific abnormal finding of lung field: Secondary | ICD-10-CM | POA: Diagnosis not present

## 2022-10-15 ENCOUNTER — Other Ambulatory Visit: Payer: Federal, State, Local not specified - PPO

## 2022-10-16 ENCOUNTER — Encounter (HOSPITAL_COMMUNITY): Payer: Self-pay | Admitting: Emergency Medicine

## 2022-10-17 ENCOUNTER — Other Ambulatory Visit: Payer: Self-pay

## 2022-10-17 ENCOUNTER — Encounter (HOSPITAL_COMMUNITY): Payer: Self-pay | Admitting: Emergency Medicine

## 2022-10-17 NOTE — Progress Notes (Signed)
PCP - Dr Seabron Spates Cardiologist - none Pulmonology - Dr Levy Pupa Urology - Dr Legrand Rams  CT Chest x-ray - 10/09/22 EKG - 05/23/22 Stress Test - greater than 3 yrs ago ECHO - n/a Cardiac Cath - n/a  ICD Pacemaker/Loop - n/a  Sleep Study -  n/a CPAP - none  Diabetes - n/a  STOP now taking any Aspirin (unless otherwise instructed by your surgeon), Aleve, Naproxen, Ibuprofen, Motrin, Advil, Goody's, BC's, all herbal medications, fish oil, and all vitamins.   Coronavirus Screening Do you have any of the following symptoms:  Cough yes/no: No Fever (>100.29F)  yes/no: No Runny nose yes/no: No Sore throat yes/no: No Difficulty breathing/shortness of breath  yes/no: No  Have you traveled in the last 14 days and where? Yes, [place]  Patient verbalized understanding of instructions that were given via phone.

## 2022-10-20 ENCOUNTER — Encounter (HOSPITAL_COMMUNITY): Payer: Self-pay | Admitting: Emergency Medicine

## 2022-10-20 ENCOUNTER — Ambulatory Visit (HOSPITAL_COMMUNITY): Payer: Federal, State, Local not specified - PPO | Admitting: Anesthesiology

## 2022-10-20 ENCOUNTER — Ambulatory Visit (HOSPITAL_COMMUNITY)
Admission: RE | Admit: 2022-10-20 | Discharge: 2022-10-20 | Disposition: A | Discharge status: Home / Self Care | Payer: Federal, State, Local not specified - PPO | Attending: Emergency Medicine | Admitting: Emergency Medicine

## 2022-10-20 ENCOUNTER — Ambulatory Visit (HOSPITAL_COMMUNITY): Payer: Federal, State, Local not specified - PPO

## 2022-10-20 ENCOUNTER — Other Ambulatory Visit: Payer: Self-pay

## 2022-10-20 ENCOUNTER — Encounter (HOSPITAL_COMMUNITY)
Admission: RE | Disposition: A | Discharge status: Home / Self Care | Payer: Self-pay | Source: Home / Self Care | Attending: Emergency Medicine

## 2022-10-20 DIAGNOSIS — J4489 Other specified chronic obstructive pulmonary disease: Secondary | ICD-10-CM | POA: Diagnosis not present

## 2022-10-20 DIAGNOSIS — H35379 Puckering of macula, unspecified eye: Secondary | ICD-10-CM | POA: Diagnosis not present

## 2022-10-20 DIAGNOSIS — K449 Diaphragmatic hernia without obstruction or gangrene: Secondary | ICD-10-CM | POA: Insufficient documentation

## 2022-10-20 DIAGNOSIS — R9389 Abnormal findings on diagnostic imaging of other specified body structures: Secondary | ICD-10-CM | POA: Diagnosis not present

## 2022-10-20 DIAGNOSIS — I10 Essential (primary) hypertension: Secondary | ICD-10-CM | POA: Insufficient documentation

## 2022-10-20 DIAGNOSIS — R911 Solitary pulmonary nodule: Secondary | ICD-10-CM | POA: Diagnosis not present

## 2022-10-20 DIAGNOSIS — Z48813 Encounter for surgical aftercare following surgery on the respiratory system: Secondary | ICD-10-CM | POA: Diagnosis not present

## 2022-10-20 DIAGNOSIS — Z7951 Long term (current) use of inhaled steroids: Secondary | ICD-10-CM | POA: Diagnosis not present

## 2022-10-20 DIAGNOSIS — I2510 Atherosclerotic heart disease of native coronary artery without angina pectoris: Secondary | ICD-10-CM | POA: Insufficient documentation

## 2022-10-20 DIAGNOSIS — N400 Enlarged prostate without lower urinary tract symptoms: Secondary | ICD-10-CM | POA: Diagnosis not present

## 2022-10-20 DIAGNOSIS — K219 Gastro-esophageal reflux disease without esophagitis: Secondary | ICD-10-CM | POA: Insufficient documentation

## 2022-10-20 DIAGNOSIS — R918 Other nonspecific abnormal finding of lung field: Secondary | ICD-10-CM | POA: Insufficient documentation

## 2022-10-20 DIAGNOSIS — J984 Other disorders of lung: Secondary | ICD-10-CM | POA: Diagnosis not present

## 2022-10-20 HISTORY — PX: BRONCHIAL NEEDLE ASPIRATION BIOPSY: SHX5106

## 2022-10-20 HISTORY — PX: BRONCHIAL BRUSHINGS: SHX5108

## 2022-10-20 HISTORY — PX: BRONCHIAL WASHINGS: SHX5105

## 2022-10-20 HISTORY — DX: Unspecified osteoarthritis, unspecified site: M19.90

## 2022-10-20 HISTORY — PX: BRONCHIAL BIOPSY: SHX5109

## 2022-10-20 LAB — BASIC METABOLIC PANEL
Anion gap: 8 (ref 5–15)
BUN: 19 mg/dL (ref 8–23)
CO2: 28 mmol/L (ref 22–32)
Calcium: 9.6 mg/dL (ref 8.9–10.3)
Chloride: 103 mmol/L (ref 98–111)
Creatinine, Ser: 1.08 mg/dL (ref 0.61–1.24)
GFR, Estimated: >60 mL/min (ref >60)
Glucose, Bld: 108 mg/dL — ABNORMAL HIGH (ref 70–99)
Potassium: 3.8 mmol/L (ref 3.5–5.1)
Sodium: 139 mmol/L (ref 135–145)

## 2022-10-20 LAB — BODY FLUID CELL COUNT WITH DIFFERENTIAL
Eos, Fluid: 6 %
Lymphs, Fluid: 14 %
Monocyte-Macrophage-Serous Fluid: 52 % (ref 50–90)
Neutrophil Count, Fluid: 28 % — ABNORMAL HIGH (ref 0–25)
Total Nucleated Cell Count, Fluid: 56 uL (ref 0–1000)

## 2022-10-20 SURGERY — BRONCHOSCOPY, WITH BIOPSY USING ELECTROMAGNETIC NAVIGATION
Anesthesia: General

## 2022-10-20 MED ORDER — OXYCODONE HCL 5 MG/5ML PO SOLN: 5.0000 mg | Freq: Once | ORAL | Status: DC | PRN

## 2022-10-20 MED ORDER — CHLORHEXIDINE GLUCONATE 0.12 % MT SOLN
OROMUCOSAL | Status: AC
  Administered 2022-10-20: 15 mL via OROMUCOSAL
  Filled 2022-10-20: qty 15

## 2022-10-20 MED ORDER — LIDOCAINE 2% (20 MG/ML) 5 ML SYRINGE
INTRAMUSCULAR | Status: DC | PRN
  Administered 2022-10-20: 60 mg via INTRAVENOUS

## 2022-10-20 MED ORDER — LACTATED RINGERS IV SOLN: INTRAVENOUS | Status: DC

## 2022-10-20 MED ORDER — FENTANYL CITRATE (PF) 100 MCG/2ML IJ SOLN
INTRAMUSCULAR | Status: AC
  Filled 2022-10-20: qty 2

## 2022-10-20 MED ORDER — PROPOFOL 10 MG/ML IV BOLUS
INTRAVENOUS | Status: DC | PRN
  Administered 2022-10-20: 200 mg via INTRAVENOUS

## 2022-10-20 MED ORDER — ONDANSETRON HCL 4 MG/2ML IJ SOLN: 4.0000 mg | Freq: Four times a day (QID) | INTRAMUSCULAR | Status: DC | PRN

## 2022-10-20 MED ORDER — FENTANYL CITRATE (PF) 250 MCG/5ML IJ SOLN
INTRAMUSCULAR | Status: DC | PRN
  Administered 2022-10-20: 50 ug via INTRAVENOUS

## 2022-10-20 MED ORDER — OXYCODONE HCL 5 MG PO TABS: 5.0000 mg | ORAL_TABLET | Freq: Once | ORAL | Status: DC | PRN

## 2022-10-20 MED ORDER — MIDAZOLAM HCL 2 MG/2ML IJ SOLN
INTRAMUSCULAR | Status: AC
  Filled 2022-10-20: qty 2

## 2022-10-20 MED ORDER — MIDAZOLAM HCL 2 MG/2ML IJ SOLN
INTRAMUSCULAR | Status: DC | PRN
  Administered 2022-10-20: 1 mg via INTRAVENOUS

## 2022-10-20 MED ORDER — PROPOFOL 500 MG/50ML IV EMUL
INTRAVENOUS | Status: DC | PRN
Start: 2022-10-20 — End: 2022-10-20
  Administered 2022-10-20: 110 ug/kg/min via INTRAVENOUS

## 2022-10-20 MED ORDER — PROPOFOL 1000 MG/100ML IV EMUL
INTRAVENOUS | Status: AC
  Filled 2022-10-20: qty 100

## 2022-10-20 MED ORDER — FENTANYL CITRATE (PF) 100 MCG/2ML IJ SOLN: 25.0000 ug | INTRAMUSCULAR | Status: DC | PRN

## 2022-10-20 MED ORDER — DEXAMETHASONE SODIUM PHOSPHATE 10 MG/ML IJ SOLN
INTRAMUSCULAR | Status: DC | PRN
  Administered 2022-10-20: 10 mg via INTRAVENOUS

## 2022-10-20 MED ORDER — ONDANSETRON HCL 4 MG/2ML IJ SOLN
INTRAMUSCULAR | Status: DC | PRN
  Administered 2022-10-20: 4 mg via INTRAVENOUS

## 2022-10-20 MED ORDER — ROCURONIUM BROMIDE 10 MG/ML (PF) SYRINGE
PREFILLED_SYRINGE | INTRAVENOUS | Status: DC | PRN
  Administered 2022-10-20: 50 mg via INTRAVENOUS

## 2022-10-20 MED ORDER — CHLORHEXIDINE GLUCONATE 0.12 % MT SOLN: 15.0000 mL | Freq: Once | OROMUCOSAL | Status: AC

## 2022-10-20 MED ORDER — SUGAMMADEX SODIUM 200 MG/2ML IV SOLN
INTRAVENOUS | Status: DC | PRN
  Administered 2022-10-20: 200 mg via INTRAVENOUS

## 2022-10-20 NOTE — Op Note (Signed)
Video Bronchoscopy with Robotic Assisted Bronchoscopic Navigation   Date of Operation: 10/20/2022   Pre-op Diagnosis: Bilateral pulmonary infiltrates  Post-op Diagnosis: Same  Surgeon: Levy Pupa  Assistants: None  Anesthesia: General endotracheal anesthesia  Operation: Flexible video fiberoptic bronchoscopy with robotic assistance and biopsies.  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  He was found to have scattered bilateral infiltrates previously on a standard cardiac scoring CT chest 08/2022.  These persisted on a follow-up CT chest and recommendation made to achieve a tissue diagnosis, obtain culture data via robotic assisted navigational bronchoscopy. The risks, benefits, complications, treatment options and expected outcomes were discussed with the patient.  The possibilities of pneumothorax, pneumonia, reaction to medication, pulmonary aspiration, perforation of a viscus, bleeding, failure to diagnose a condition and creating a complication requiring transfusion or operation were discussed with the patient who freely signed the consent.    Description of Procedure: The patient was seen in the Preoperative Area, was examined and was deemed appropriate to proceed.  The patient was taken to [facility] endoscopy room 3, identified as Jason Share, MD and the procedure verified as Flexible Video Fiberoptic Bronchoscopy.  A Time Out was held and the above information confirmed.   Prior to the date of the procedure a high-resolution CT scan of the chest was performed. Utilizing ION software program a virtual tracheobronchial tree was generated to allow the creation of distinct navigation pathways to the patient's parenchymal abnormalities. After being taken to the operating room general anesthesia was initiated and the patient  was  orally intubated. The video fiberoptic bronchoscope was introduced via the endotracheal tube and a general inspection was performed which showed normal right and left lung anatomy. Aspiration of the bilateral mainstems was completed to remove any remaining secretions. Robotic catheter inserted into patient's endotracheal tube.   Target #1 left upper lobe infiltrate The distinct navigation pathways prepared prior to this procedure were then utilized to navigate to patient's lesion identified on CT scan. The robotic catheter was secured into place and the vision probe was withdrawn.  Lesion location was approximated using fluoroscopy.  Local registration and targeting was performed using Cios three-dimensional imaging.  Under fluoroscopic guidance transbronchial brushings, transbronchial needle biopsies, and transbronchial forceps biopsies were performed to be sent for cytology and pathology. A bronchioalveolar lavage was performed in the left upper lobe and sent for cell count, microbiology and cytology.  At the end of the procedure a general airway inspection was performed and there was no evidence of active bleeding. The bronchoscope was removed.  The patient tolerated the procedure well. There was no significant blood loss and there were no obvious complications. A post-procedural chest x-ray is pending.  Samples Target #1: 1. Transbronchial brushings from left upper lobe infiltrate 2. Transbronchial Wang needle biopsies from left upper lobe infiltrate 3. Transbronchial forceps biopsies from left upper lobe infiltrate 4. Bronchoalveolar lavage from left upper lobe   Plans:  The patient will be discharged from the PACU to home when recovered from anesthesia and after chest x-ray is reviewed. We will review the cytology, pathology and microbiology results with the patient when they become available. Outpatient followup will be with Dr. Delton Coombes.    Levy Pupa, MD, PhD 10/20/2022, 9:40 AM [facility] [phone] or if no answer before 7:00PM call [phone] For any issues after  7:00PM please call eLink [phone]  '

## 2022-10-20 NOTE — Interval H&P Note (Signed)
History and Physical Interval Note:  10/20/2022 7:18 AM  Barbaraann Share, MD  has presented today for surgery, with the diagnosis of bilateral pulmonary infiltrate.  The various methods of treatment have been discussed with the patient and family. After consideration of risks, benefits and other options for treatment, the patient has consented to  Procedure(s): ROBOTIC ASSISTED NAVIGATIONAL BRONCHOSCOPY (N/A) as a surgical intervention.  The patient's history has been reviewed, patient examined, no change in status, stable for surgery.  I have reviewed the patient's chart and labs.  Questions were answered to the patient's satisfaction.     Leslye Peer

## 2022-10-20 NOTE — Anesthesia Preprocedure Evaluation (Signed)
Anesthesia Evaluation  Patient identified by MRN, date of birth, ID band Patient awake    Reviewed: Allergy & Precautions, H&P , NPO status , Patient's Chart, lab work & pertinent test results  Airway Mallampati: II   Neck ROM: full    Dental   Pulmonary asthma    breath sounds clear to auscultation       Cardiovascular hypertension,  Rhythm:regular Rate:Normal     Neuro/Psych  Headaches    GI/Hepatic hiatal hernia,GERD  ,,  Endo/Other    Renal/GU stones     Musculoskeletal  (+) Arthritis ,    Abdominal   Peds  Hematology   Anesthesia Other Findings   Reproductive/Obstetrics                             Anesthesia Physical Anesthesia Plan  ASA: 2  Anesthesia Plan: General   Post-op Pain Management:    Induction: Intravenous  PONV Risk Score and Plan: 2 and Ondansetron, Dexamethasone, Propofol infusion, Midazolam and Treatment may vary due to age or medical condition  Airway Management Planned: Oral ETT  Additional Equipment:   Intra-op Plan:   Post-operative Plan: Extubation in OR  Informed Consent: I have reviewed the patients History and Physical, chart, labs and discussed the procedure including the risks, benefits and alternatives for the proposed anesthesia with the patient or authorized representative who has indicated his/her understanding and acceptance.     Dental advisory given  Plan Discussed with: CRNA, Anesthesiologist and Surgeon  Anesthesia Plan Comments:        Anesthesia Quick Evaluation

## 2022-10-20 NOTE — Transfer of Care (Signed)
Immediate Anesthesia Transfer of Care Note  Patient: Jason Share, MD  Procedure(s) Performed: ROBOTIC ASSISTED NAVIGATIONAL BRONCHOSCOPY BRONCHIAL BIOPSIES BRONCHIAL BRUSHINGS BRONCHIAL NEEDLE ASPIRATION BIOPSIES BRONCHIAL WASHINGS  Patient Location: PACU  Anesthesia Type:General  Level of Consciousness: awake, drowsy, and patient cooperative  Airway & Oxygen Therapy: Patient Spontanous Breathing  Post-op Assessment: Report given to RN and Post -op Vital signs reviewed and stable  Post vital signs: Reviewed and stable  Last Vitals:  Vitals Value Taken Time  BP    Temp    Pulse    Resp    SpO2      Last Pain:  Vitals:   10/20/22 0700  TempSrc:   PainSc: 0-No pain      Patients Stated Pain Goal: 0 (10/20/22 0700)  Complications: No notable events documented.

## 2022-10-20 NOTE — Discharge Instructions (Signed)
Flexible Bronchoscopy, Care After This sheet gives you information about how to care for yourself after your test. Your doctor may also give you more specific instructions. If you have problems or questions, contact your doctor. Follow these instructions at home: Eating and drinking When your numbness is gone and your cough and gag reflexes have come back, you may: Eat only soft foods. Slowly drink liquids. The day after the test, go back to your normal diet. Driving Do not drive for 24 hours if you were given a medicine to help you relax (sedative). Do not drive or use heavy machinery while taking prescription pain medicine. General instructions  Take over-the-counter and prescription medicines only as told by your doctor. Return to your normal activities as told. Ask what activities are safe for you. Do not use any products that have nicotine or tobacco in them. This includes cigarettes and e-cigarettes. If you need help quitting, ask your doctor. Keep all follow-up visits as told by your doctor. This is important. It is very important if you had a tissue sample (biopsy) taken. Get help right away if: You have shortness of breath that gets worse. You get light-headed. You feel like you are going to pass out (faint). You have chest pain. You cough up: More than a little blood. More blood than before. Summary Do not eat or drink anything (not even water) for 2 hours after your test, or until your numbing medicine wears off. Do not use cigarettes. Do not use e-cigarettes. Get help right away if you have chest pain.  Please call our office for any questions or concerns.  [phone].  This information is not intended to replace advice given to you by your health care provider. Make sure you discuss any questions you have with your health care provider. Document Released: 11/17/2008 Document Revised: 01/02/2017 Document Reviewed: 02/08/2016 Elsevier Patient Education  2020 Elsevier  Inc.  

## 2022-10-21 ENCOUNTER — Encounter (HOSPITAL_COMMUNITY): Payer: Self-pay | Admitting: Emergency Medicine

## 2022-10-21 ENCOUNTER — Telehealth: Payer: Self-pay | Admitting: Emergency Medicine

## 2022-10-21 LAB — CYTOLOGY - NON PAP

## 2022-10-21 MED ORDER — LEVALBUTEROL HCL 0.63 MG/3ML IN NEBU: 0.6300 mg | INHALATION_SOLUTION | Freq: Four times a day (QID) | RESPIRATORY_TRACT | 5 refills | Status: AC | PRN

## 2022-10-21 NOTE — Telephone Encounter (Signed)
I was contacted by Dr. Shelle Iron to update on his status.  He has had some chest congestion, coughing, throat discomfort.  Has been using some albuterol nebs, does better with the lev albuterol nebs.  I will send a prescription for this to his pharmacy.  He will contact me if he has any problems.

## 2022-10-22 LAB — CULTURE, BAL-QUANTITATIVE W GRAM STAIN: Culture: NO GROWTH

## 2022-10-22 LAB — CYTOLOGY - NON PAP

## 2022-10-23 DIAGNOSIS — M1711 Unilateral primary osteoarthritis, right knee: Secondary | ICD-10-CM | POA: Diagnosis not present

## 2022-10-23 NOTE — Anesthesia Postprocedure Evaluation (Signed)
Anesthesia Post Note  Patient: Jason Share, MD  Procedure(s) Performed: ROBOTIC ASSISTED NAVIGATIONAL BRONCHOSCOPY BRONCHIAL BIOPSIES BRONCHIAL BRUSHINGS BRONCHIAL NEEDLE ASPIRATION BIOPSIES BRONCHIAL WASHINGS     Patient location during evaluation: PACU Anesthesia Type: General Level of consciousness: awake and alert Pain management: pain level controlled Vital Signs Assessment: post-procedure vital signs reviewed and stable Respiratory status: spontaneous breathing, nonlabored ventilation, respiratory function stable and patient connected to nasal cannula oxygen Cardiovascular status: blood pressure returned to baseline and stable Postop Assessment: no apparent nausea or vomiting Anesthetic complications: no   No notable events documented.  Last Vitals:  Vitals:   10/20/22 1015 10/20/22 1030  BP: (!) 134/92 (!) 136/93  Pulse: (!) 55 (!) 59  Resp: 14 14  Temp:  36.5 C  SpO2: 94% 96%    Last Pain:  Vitals:   10/20/22 0950  TempSrc:   PainSc: 0-No pain                 [name]

## 2022-10-25 LAB — AEROBIC/ANAEROBIC CULTURE W GRAM STAIN (SURGICAL/DEEP WOUND): Culture: NO GROWTH

## 2022-10-28 ENCOUNTER — Ambulatory Visit: Payer: Federal, State, Local not specified - PPO | Admitting: Dermatology

## 2022-10-28 ENCOUNTER — Encounter: Payer: Self-pay | Admitting: Dermatology

## 2022-10-28 ENCOUNTER — Ambulatory Visit: Payer: Federal, State, Local not specified - PPO | Admitting: Emergency Medicine

## 2022-10-28 ENCOUNTER — Encounter: Payer: Self-pay | Admitting: Emergency Medicine

## 2022-10-28 VITALS — BP 107/70 | HR 59

## 2022-10-28 VITALS — BP 122/68 | HR 61 | Ht 74.0 in | Wt 201.0 lb

## 2022-10-28 DIAGNOSIS — L578 Other skin changes due to chronic exposure to nonionizing radiation: Secondary | ICD-10-CM | POA: Diagnosis not present

## 2022-10-28 DIAGNOSIS — Z79899 Other long term (current) drug therapy: Secondary | ICD-10-CM

## 2022-10-28 DIAGNOSIS — D1801 Hemangioma of skin and subcutaneous tissue: Secondary | ICD-10-CM

## 2022-10-28 DIAGNOSIS — L821 Other seborrheic keratosis: Secondary | ICD-10-CM | POA: Diagnosis not present

## 2022-10-28 DIAGNOSIS — W908XXA Exposure to other nonionizing radiation, initial encounter: Secondary | ICD-10-CM | POA: Diagnosis not present

## 2022-10-28 DIAGNOSIS — R9389 Abnormal findings on diagnostic imaging of other specified body structures: Secondary | ICD-10-CM | POA: Diagnosis not present

## 2022-10-28 DIAGNOSIS — D170 Benign lipomatous neoplasm of skin and subcutaneous tissue of head, face and neck: Secondary | ICD-10-CM

## 2022-10-28 DIAGNOSIS — Z1283 Encounter for screening for malignant neoplasm of skin: Secondary | ICD-10-CM

## 2022-10-28 DIAGNOSIS — Z7189 Other specified counseling: Secondary | ICD-10-CM

## 2022-10-28 DIAGNOSIS — D229 Melanocytic nevi, unspecified: Secondary | ICD-10-CM

## 2022-10-28 DIAGNOSIS — J4520 Mild intermittent asthma, uncomplicated: Secondary | ICD-10-CM | POA: Diagnosis not present

## 2022-10-28 DIAGNOSIS — J8410 Pulmonary fibrosis, unspecified: Secondary | ICD-10-CM | POA: Diagnosis not present

## 2022-10-28 DIAGNOSIS — B353 Tinea pedis: Secondary | ICD-10-CM

## 2022-10-28 DIAGNOSIS — B351 Tinea unguium: Secondary | ICD-10-CM

## 2022-10-28 DIAGNOSIS — L814 Other melanin hyperpigmentation: Secondary | ICD-10-CM

## 2022-10-28 MED ORDER — TERBINAFINE HCL 250 MG PO TABS: 250.0000 mg | ORAL_TABLET | Freq: Every day | ORAL | 1 refills | Status: DC

## 2022-10-28 MED ORDER — FLUOROURACIL 5 % EX CREA
TOPICAL_CREAM | CUTANEOUS | 2 refills | Status: AC
Start: 2022-10-28 — End: ?

## 2022-10-28 NOTE — Assessment & Plan Note (Addendum)
Transbronchial biopsies with noncaseating granulomas.  AFB and fungal smears negative, cultures pending.  Would be a strange presentation for sarcoidosis at his age and in absence of significant lymphadenopathy.  Other causes could include lipoid pneumonia, foreign body inhalation such as from his inhaled medication.  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  He does have obstructive lung disease which would go along with sarcoid but no other manifestations.  No rash, no lymphadenopathy.  He does have first-degree AV block of unclear significance that may or may not be related but he is planning to see cardiology and may merit structural cardiac eval  We will have your pathology results over read to rule out any evidence of birefringent material or lipoid pneumonia, aspiration We will plan to repeat your CT chest with contrast at the end of December 2024 Follow Dr. Delton Coombes in late December or early January 2025 so we can review results and discuss.

## 2022-10-28 NOTE — Patient Instructions (Addendum)
[facility] (Dr. Sedalia Muta) for laser [phone]       Skin Education : We counseled the patient regarding the following: Sun screen (SPF 30 or greater) should be applied during peak UV exposure (between 10am and 2pm) and reapplied after exercise or swimming.  The ABCDEs of melanoma were reviewed with the patient, and the importance of monthly self-examination of moles was emphasized. Should any moles change in shape or color, or itch, bleed or burn, pt will contact our office for evaluation sooner then their interval appointment.  Plan: Sunscreen Recommendations We recommended a broad spectrum sunscreen with a SPF of 30 or higher.  SPF 30 sunscreens block approximately 97 percent of the sun's harmful rays. Sunscreens should be applied at least 15 minutes prior to expected sun exposure and then every 2 hours after that as long as sun exposure continues. If swimming or exercising sunscreen should be reapplied every 45 minutes to an hour after getting wet or sweating. One ounce, or the equivalent of a shot glass full of sunscreen, is adequate to protect the skin not covered by a bathing suit. We also recommended a lip balm with a sunscreen as well. Sun protective clothing can be used in lieu of sunscreen but must be worn the entire time you are exposed to the sun's rays. Important Information   Due to recent changes in healthcare laws, you may see results of your pathology and/or laboratory studies on MyChart before the doctors have had a chance to review them. We understand that in some cases there may be results that are confusing or concerning to you. Please understand that not all results are received at the same time and often the doctors may need to interpret multiple results in order to provide you with the best plan of care or course of treatment. Therefore, we ask that you please give [place] 2 business days to thoroughly review all your results before contacting the office for clarification.  Should we see a critical lab result, you will be contacted sooner.     If You Need Anything After Your Visit   If you have any questions or concerns for your doctor, please call our main line at [phone]. If no one answers, please leave a voicemail as directed and we will return your call as soon as possible. Messages left after 4 pm will be answered the following business day.    You may also send [place] a message via MyChart. We typically respond to MyChart messages within 1-2 business days.  For prescription refills, please ask your pharmacy to contact our office. Our fax number is [phone].  If you have an urgent issue when the clinic is closed that cannot wait until the next business day, you can page your doctor at the number below.     Please note that while we do our best to be available for urgent issues outside of office hours, we are not available 24/7.    If you have an urgent issue and are unable to reach [place], you may choose to seek medical care at your doctor's office, retail clinic, urgent care center, or emergency room.   If you have a medical emergency, please immediately call 911 or go to the emergency department. In the event of inclement weather, please call our main line at [phone] for an update on the status of any delays or closures.  Dermatology Medication Tips: Please keep the boxes that topical medications come in in order to help keep track  of the instructions about where and how to use these. Pharmacies typically print the medication instructions only on the boxes and not directly on the medication tubes.   If your medication is too expensive, please contact our office at [phone] or send [place] a message through MyChart.    We are unable to tell what your co-pay for medications will be in advance as this is different depending on your insurance coverage. However, we may be able to find a substitute medication at lower cost or fill out paperwork to get  insurance to cover a needed medication.    If a prior authorization is required to get your medication covered by your insurance company, please allow [place] 1-2 business days to complete this process.   Drug prices often vary depending on where the prescription is filled and some pharmacies may offer cheaper prices.   The website www.goodrx.com contains coupons for medications through different pharmacies. The prices here do not account for what the cost may be with help from insurance (it may be cheaper with your insurance), but the website can give you the price if you did not use any insurance.  - You can print the associated coupon and take it with your prescription to the pharmacy.  - You may also stop by our office during regular business hours and pick up a GoodRx coupon card.  - If you need your prescription sent electronically to a different pharmacy, notify our office through [facility] or by phone at [phone]

## 2022-10-28 NOTE — Progress Notes (Signed)
Subjective:    Patient ID: Barbaraann Share, MD, male    DOB: [birth year], 62 y.o.   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  He is here today to discuss an abnormal CT scan of the chest.  Past medical history significant for hypertension, macular degeneration/pucker, GERD with a hiatal hernia, BPH w hx elevated PSA > .  He has mild asthma that was diagnosed by PFT w obstruction, currently managed on Breo, fluticasone nasal spray. He has had 2 flares in the past when he had URI's. Never needs albuterol.  Tajuan underwent a standard cardiac scoring CT chest on 08/29/2022 as below.  This identified CAD as noted, but also showed some bilateral scattered irregular nodular opacities in a somewhat peribronchovascular distribution.  He reports that he is well. He is on [place], but it just went generic - ? Whether this is an important exposure. He has allergies and has had hypereosinophilia before. Potential sensitive exposures include pine straw, he has not seen mold in the home. He feels at baseline, although more evening cough, dry. He has had some increased reflux sx as well. He had an isolated aspiration event about a month ago. He has had arthritis in toes and elbows before that have required pred > intermittent. No crystals on aspiration, mildly elevated uric acid. No rashes.   Cardiac scoring CT chest 08/29/2022 reviewed by me shows bilateral nodularity and architectural distortion that appears to be in a peribronchovascular distribution.  It's most notable in the right middle lobe.  There are no pathologic mediastinal or hilar nodes.  PFT 08/03/2012 reviewed by me. Shows moderate obstruction without a bronchodilator response.  FEV1 74% predicted.   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  He has a history of intermittent asthma.  I saw him after he was spuriously found to have scattered  nodular infiltrates on CT scan of the chest.  He underwent navigational bronchoscopy with biopsies and BAL on 10/20/2022.  His transbronchial needle biopsies showed noncaseating granulomas.  His AFB and fungal smears are negative, cultures are negative so far but not final.  His HSP panel, QuantiFERON gold, autoimmune labs are all negative.  He did have elevated percent peripheral eosinophils but the absolute count was normal. Question dx sarcoidosis as he presenting at unusual age. ? Possibly related to an aspiration event, there are case reports of granulomas being brought on by covid vaccine. ? Foreign body, ? Aspiration     Review of Systems As per HPI  Past Medical History:  Diagnosis Date   Arthritis    right knee   BPH (benign prostatic hypertrophy)    GERD (gastroesophageal reflux disease)    H/O hiatal hernia    History of colon polyps    History of kidney stones    surgery to remove   Hyperlipidemia    Hypertension    Knee internal derangement    RIGHT   Macular pucker, right eye 07/05/2019   Vitrectomy, removal of silicone oil, membrane peel, repair complex retinal detachment inferonasal right eye  11-30-19   Mild asthma    Nuclear sclerotic cataract of left eye 03/26/2021   Discussion with patient and family that medical reasons that cataract surgery should be undertaken so as to allow for ultimately complete removal of the anterior hyaloid at time of vitrectomy   Personal history of COVID-19  08/2020   Right inguinal hernia    Vitamin D deficiency    Wears glasses      Family History  Problem Relation Age of Onset   Hyperlipidemia Mother    Coronary artery disease Mother    Pulmonary embolism Father    Colon cancer Father    Hyperlipidemia Brother    Pancreatic cancer Brother      Social History   Socioeconomic History   Marital status: Married    Spouse name: jan   Number of children: 2   Years of education: Not on file   Highest education level: Not on  file  Occupational History   Occupation: physician  Tobacco Use   Smoking status: Never    Passive exposure: Never   Smokeless tobacco: Never  Vaping Use   Vaping status: Never Used  Substance and Sexual Activity   Alcohol use: Yes    Alcohol/week: 0.0 standard drinks of alcohol    Comment: social use   Drug use: No   Sexual activity: Yes    Partners: Female  Other Topics Concern   Not on file  Social History Narrative   Not on file   Social Determinants of Health   Financial Resource Strain: Not on file  Food Insecurity: Not on file  Transportation Needs: Not on file  Physical Activity: Not on file  Stress: Not on file  Social Connections: Not on file  Intimate Partner Violence: Not on file    [place], [place], [place]   Allergies  Allergen Reactions   Merthiolate [Thimerosal (Thiomersal)] Rash    Rash--local     Outpatient Medications Prior to Visit  Medication Sig Dispense Refill   allopurinol (ZYLOPRIM) 300 MG tablet TAKE 1 TABLET DAILY 90 tablet 0   atorvastatin (LIPITOR) 40 MG tablet TAKE 1 TABLET EVERY MORNING 90 tablet 0   fluticasone (FLONASE) 50 MCG/ACT nasal spray Place 2 sprays into both nostrils daily. 48 g 1   fluticasone furoate-vilanterol (BREO ELLIPTA) 100-25 MCG/ACT AEPB Inhale 1 puff into the lungs daily. 3 each 3   ibuprofen (ADVIL) 600 MG tablet Take 600 mg by mouth every 6 (six) hours as needed.     levalbuterol (XOPENEX) 0.63 MG/3ML nebulizer solution Take 3 mLs (0.63 mg total) by nebulization every 6 (six) hours as needed for wheezing or shortness of breath. 90 mL 5   nebivolol (BYSTOLIC) 10 MG tablet TAKE 1 TABLET AT BEDTIME 90 tablet 0   olmesartan-hydrochlorothiazide (BENICAR HCT) 40-12.5 MG tablet TAKE 1 TABLET DAILY 90 tablet 0   omeprazole (PRILOSEC) 40 MG capsule TAKE 1 CAPSULE DAILY AS    NEEDED FOR HEARTBURN 90 capsule 0   VITAMIN D PO Take 2 capsules by mouth daily.     No facility-administered medications prior to visit.        Objective:    Physical Exam Vitals:   10/28/22 1322  BP: 122/68  Pulse: 61  SpO2: 97%  Weight: 201 lb (91.2 kg)  Height: 6\' 2"  (1.88 m)    Gen: Pleasant, well-nourished, in no distress,  normal affect  ENT: No lesions,  mouth clear,  oropharynx clear, no postnasal drip  Neck: No JVD, no stridor  Lungs: No use of accessory muscles, no crackles or wheezing on normal respiration, no wheeze on forced expiration  Cardiovascular: RRR, heart sounds normal, no murmur or gallops, no peripheral edema  Musculoskeletal: No deformities, no cyanosis or clubbing  Neuro: alert, awake, non focal  Skin: Warm, no lesions or rash  Assessment & Plan:  Granulomatous lung disease (HCC) Transbronchial biopsies with noncaseating granulomas.  AFB and fungal smears negative, cultures pending.  Would be a strange presentation for sarcoidosis at his age and in absence of significant lymphadenopathy.  Other causes could include lipoid pneumonia, foreign body inhalation such as from his inhaled medication.  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  He does have obstructive lung disease which would go along with sarcoid but no other manifestations.  No rash, no lymphadenopathy.  He does have first-degree AV block of unclear significance that may or may not be related but he is planning to see cardiology and may merit structural cardiac eval  We will have your pathology results over read to rule out any evidence of birefringent material or lipoid pneumonia, aspiration We will plan to repeat your CT chest with contrast at the end of December 2024 Follow Dr. Delton Coombes in late December or early January 2025 so we can review results and discuss.  Mild reactive airways disease We will temporarily stop his Breo, continue albuterol/Xopenex as needed.  Check PFT    Levy Pupa, MD, PhD 10/28/2022, 4:49 PM [facility] [phone] or if no answer before 7:00PM call [phone] For any issues after 7:00PM please call eLink [phone]

## 2022-10-28 NOTE — Patient Instructions (Addendum)
We will have your pathology results over read to rule out any evidence of birefringent material or lipoid pneumonia, aspiration We will plan to repeat your CT chest with contrast at the end of December 2024 Stop your Breo for now. Keep your albuterol and your Xopenex available to use as needed for any evidence of airflow obstruction Get your pulmonary function testing as planned Follow Dr. Delton Coombes in late December or early January 2025 so we can review results and discuss.

## 2022-10-28 NOTE — Progress Notes (Signed)
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 The patient has spots, moles and lesions to be evaluated, some may be new or changing and the patient may have concern these could be cancer. Pt's last skin check was around 2 years ago. Pt has no hx of skin cancer nor family hx Retired pulmonologist Recently diagnosed with granulomatous change in pulmonary infiltrates, likely sarcoid  The following portions of the chart were reviewed this encounter and updated as appropriate: medications, allergies, medical history  Review of Systems:  No other skin or systemic complaints except as noted in HPI or Assessment and Plan.  Objective  Well appearing patient in no apparent distress; mood and affect are within normal limits.  A full examination was performed including scalp, head, eyes, ears, nose, lips, neck, chest, axillae, abdomen, back, buttocks, bilateral upper extremities, bilateral lower extremities, hands, feet, fingers, toes, fingernails, and toenails. All findings within normal limits unless otherwise noted below.   Relevant physical exam findings are noted in the Assessment and Plan.   Assessment & Plan   SKIN CANCER SCREENING PERFORMED TODAY.  ACTINIC DAMAGE - Chronic condition, secondary to cumulative UV/sun exposure - diffuse scaly erythematous macules with underlying dyspigmentation - Recommend [name] broad spectrum sunscreen SPF 30+ to sun-exposed areas, reapply every 2 hours as needed.  - Staying in the shade or wearing long sleeves, sun glasses (UVA+UVB protection) and wide brim hats (4-inch brim around the entire circumference of the hat) are also recommended for sun protection.  - Call for new or changing lesions. Will start fluorouracil 5% bid for 2 weeks to scalp, temples,  nose  MELANOCYTIC NEVI - Tan-brown and/or pink-flesh-colored symmetric macules and papules - Benign appearing on exam today - Observation - Call clinic for new or changing moles - Recommend [name] use of broad spectrum spf 30+ sunscreen to sun-exposed areas.   SEBORRHEIC KERATOSIS - Stuck-on, waxy, tan-brown papules and/or plaques  - Benign-appearing - Discussed benign etiology and prognosis. - Observe - Call for any changes  LENTIGINES Exam: scattered tan macules Due to sun exposure Treatment Plan: Benign-appearing, observe. Recommend [name] broad spectrum sunscreen SPF 30+ to sun-exposed areas, reapply every 2 hours as needed.  Call for any changes    HEMANGIOMA Exam: red papule(s) Discussed benign nature. Recommend observation. Call for changes.   ONYCHOMYCOSIS and TINEA PEDIS- all 10 toenails Exam: Thickened toenails with subungal debris c/w onychomycosis  Chronic and persistent condition with duration or expected duration over one year. Condition is symptomatic/ bothersome to patient. Not currently at goal.  Terbinafine Counseling  Terbinafine is an anti-fungal medicine that can be applied to the skin (over the counter) or taken by mouth (prescription) to treat fungal infections. The pill version is often used to treat fungal infections of the nails or scalp. While most people do not have any side effects from taking terbinafine pills, some possible side effects of the medicine can include taste changes, headache, loss of smell, vision changes, nausea, vomiting, or diarrhea.   Rare side effects can include irritation of the liver, allergic reaction, or decrease in blood counts (which may show up as not feeling well or developing an infection). If you are concerned about any of these side effects, please stop the medicine and call your doctor, or in the  case of an emergency such as feeling very unwell, seek immediate medical care.   Treatment Plan: Terbinafine 250 mg 1 [name]  for 3 months - terbinafine (LAMISIL) 250 MG tablet; Take 1 tablet (250 mg total) by mouth [name].  Dispense: 90 tablet; Refill: 1   Lipoma  Exam: Subcutaneous rubbery nodule(s) Location: back of head  Benign-appearing. Exam most consistent with a lipoma. Discussed that a lipoma is a benign fatty growth that can grow over time and sometimes get irritated. Recommend observation if it is not bothersome or changing. Discussed option of ILK injections or surgical excision to remove it if it is growing, symptomatic, or other changes noted. Please call for new or changing lesions so they can be evaluated.   Return in about 10 weeks (around 01/06/2023) for nail fungus f/u.  I, [name], CMA, am acting as scribe for Jason Daily, MD.   Documentation: I have reviewed the above documentation for accuracy and completeness, and I agree with the above.  Jason Daily, MD

## 2022-10-28 NOTE — Assessment & Plan Note (Signed)
We will temporarily stop his Breo, continue albuterol/Xopenex as needed.  Check PFT

## 2022-10-30 ENCOUNTER — Telehealth: Payer: Self-pay

## 2022-10-30 NOTE — Telephone Encounter (Signed)
Received clearance from [facility] for right knee arthroplasty. Please schedule pre-op appt.

## 2022-11-03 NOTE — Progress Notes (Addendum)
Referring-Yvonne Lowne Chase DO Reason for referral-coronary calcification  HPI: 62 year old male for evaluation of coronary calcification at request of Seabron Spates, DO.  Carotid Doppler September 2020 showed minimal heterogeneous and calcified plaque with no significant stenosis. Calcium score August 29, 2022 1027 which was 93rd percentile. There was also note of widespread nodularity throughout the lungs bilaterally.  High-resolution chest CT September 2024 showed upper and midlung zone predominant peribronchovascular nodularity, nodular consolidation and architectural distortion, small mediastinal lymph nodes suggestive of sarcoid. Patient had bronchoscopy and pathology showed noncaseating granulomas present that was felt to be sarcoid like.  Patient has seen pulmonary and is being evaluated for the cause of his lung disease.  Cardiology now asked to evaluate.  He has dyspnea with more vigorous activities but not routine activities.  No orthopnea, PND, pedal edema, claudication, exertional chest pain or syncope.  Current Outpatient Medications  Medication Sig Dispense Refill   allopurinol (ZYLOPRIM) 300 MG tablet TAKE 1 TABLET DAILY 90 tablet 0   atorvastatin (LIPITOR) 40 MG tablet TAKE 1 TABLET EVERY MORNING 90 tablet 0   fluorouracil (EFUDEX) 5 % cream Apply to face, scalp and tops of ears bid for 2 wks 40 g 2   fluticasone (FLONASE) 50 MCG/ACT nasal spray Place 2 sprays into both nostrils daily as needed for allergies or rhinitis.     ibuprofen (ADVIL) 600 MG tablet Take 600 mg by mouth every 6 (six) hours as needed.     levalbuterol (XOPENEX) 0.63 MG/3ML nebulizer solution Take 3 mLs (0.63 mg total) by nebulization every 6 (six) hours as needed for wheezing or shortness of breath. 90 mL 5   nebivolol (BYSTOLIC) 10 MG tablet TAKE 1 TABLET AT BEDTIME 90 tablet 0   olmesartan-hydrochlorothiazide (BENICAR HCT) 40-12.5 MG tablet TAKE 1 TABLET DAILY 90 tablet 0   omeprazole (PRILOSEC) 40  MG capsule TAKE 1 CAPSULE DAILY AS    NEEDED FOR HEARTBURN 90 capsule 0   terbinafine (LAMISIL) 250 MG tablet Take 1 tablet (250 mg total) by mouth daily. 90 tablet 1   VITAMIN D PO Take 2 capsules by mouth daily.     No current facility-administered medications for this visit.    Allergies  Allergen Reactions   Merthiolate [Thimerosal (Thiomersal)] Rash    Rash--local     Past Medical History:  Diagnosis Date   Arthritis    right knee   BPH (benign prostatic hypertrophy)    GERD (gastroesophageal reflux disease)    H/O hiatal hernia    History of colon polyps    History of kidney stones    surgery to remove   Hyperlipidemia    Hypertension    Knee internal derangement    RIGHT   Macular pucker, right eye 07/05/2019   Vitrectomy, removal of silicone oil, membrane peel, repair complex retinal detachment inferonasal right eye  11-30-19   Mild asthma    Nuclear sclerotic cataract of left eye 03/26/2021   Discussion with patient and family that medical reasons that cataract surgery should be undertaken so as to allow for ultimately complete removal of the anterior hyaloid at time of vitrectomy   Personal history of COVID-19 08/2020   Right inguinal hernia    Vitamin D deficiency    Wears glasses     Past Surgical History:  Procedure Laterality Date   BRONCHIAL BIOPSY  10/20/2022   Procedure: BRONCHIAL BIOPSIES;  Surgeon: Leslye Peer, MD;  Location: [facility] ENDOSCOPY;  Service: Pulmonary;;  BRONCHIAL BRUSHINGS  10/20/2022   Procedure: BRONCHIAL BRUSHINGS;  Surgeon: Leslye Peer, MD;  Location: [facility] ENDOSCOPY;  Service: Pulmonary;;   BRONCHIAL NEEDLE ASPIRATION BIOPSY  10/20/2022   Procedure: BRONCHIAL NEEDLE ASPIRATION BIOPSIES;  Surgeon: Leslye Peer, MD;  Location: [facility] ENDOSCOPY;  Service: Pulmonary;;   BRONCHIAL WASHINGS  10/20/2022   Procedure: BRONCHIAL WASHINGS;  Surgeon: Leslye Peer, MD;  Location: [facility] ENDOSCOPY;  Service: Pulmonary;;   COLONOSCOPY  09/27/2020    COLONOSCOPY W/ POLYPECTOMY  01/21/2008   ESOPHAGOGASTRODUODENOSCOPY  11/15/2008   EYE SURGERY Bilateral    x6   rankin   HOLEP-LASER ENUCLEATION OF THE PROSTATE WITH MORCELLATION N/A 05/30/2022   Procedure: HOLEP-LASER ENUCLEATION OF THE PROSTATE WITH MORCELLATION;  Surgeon: Sondra Come, MD;  Location: [facility] ORS;  Service: Urology;  Laterality: N/A;   INGUINAL HERNIA REPAIR Right 08/08/2021   Procedure: OPEN RIGHT INGUINAL HERNIA REPAIR WITH MESH;  Surgeon: Darnell Level, MD;  Location: [facility] ORS;  Service: General;  Laterality: Right;   KNEE ARTHROSCOPY Right X4  last one 2005   KNEE ARTHROSCOPY Right 12/06/2013   Procedure: ARTHROSCOPY RIGHT KNEE WITH DEBRIDMENT AND PARTIAL MEDIAL AND LATERAL MENISCECTOMY AND CHONDRLPLASTY;  Surgeon: Eugenia Mcalpine, MD;  Location: [facility];  Service: Orthopedics;  Laterality: Right;   LUMBAR DISC SURGERY  04/02/2007   L5 -- S1   NEGATIVE SLEEP STUDY  per pt   PARATHYROIDECTOMY  02/04/1987   removal adenoma   PROSTATE SURGERY  05/2022   holap    Social History   Socioeconomic History   Marital status: Married    Spouse name: jan   Number of children: 2   Years of education: Not on file   Highest education level: Not on file  Occupational History   Occupation: physician  Tobacco Use   Smoking status: Never    Passive exposure: Never   Smokeless tobacco: Never  Vaping Use   Vaping status: Never Used  Substance and Sexual Activity   Alcohol use: Yes    Alcohol/week: 0.0 standard drinks of alcohol    Comment: social use   Drug use: No   Sexual activity: Yes    Partners: Female  Other Topics Concern   Not on file  Social History Narrative   Not on file   Social Determinants of Health   Financial Resource Strain: Not on file  Food Insecurity: Not on file  Transportation Needs: Not on file  Physical Activity: Not on file  Stress: Not on file  Social Connections: Not on file  Intimate Partner Violence: Not on file     Family History  Problem Relation Age of Onset   Hyperlipidemia Mother    Coronary artery disease Mother    Pulmonary embolism Father    Colon cancer Father    Hyperlipidemia Brother    Pancreatic cancer Brother     ROS: no fevers or chills, productive cough, hemoptysis, dysphasia, odynophagia, melena, hematochezia, dysuria, hematuria, rash, seizure activity, orthopnea, PND, pedal edema, claudication. Remaining systems are negative.  Physical Exam:   Blood pressure 126/84, pulse (!) 55, height 6\' 2"  (1.88 m), weight 204 lb (92.5 kg), SpO2 97%.  General:  Well developed/well nourished in NAD Skin warm/dry Patient not depressed No peripheral clubbing Back-normal HEENT-normal/normal eyelids Neck supple/normal carotid upstroke bilaterally; no bruits; no JVD; no thyromegaly chest - CTA/ normal expansion CV - RRR/normal S1 and S2; no murmurs, rubs or gallops;  PMI nondisplaced Abdomen -NT/ND, no HSM, no mass, +  bowel sounds, no bruit 2+ femoral pulses, no bruits Ext-no edema, chords, 2+ DP Neuro-grossly nonfocal  ECG -May 23, 2022-sinus bradycardia with first-degree AV block.  Personally reviewed  EKG Interpretation Date/Time:  Monday November 17 2022 14:04:18 EDT Ventricular Rate:  55 PR Interval:  218 QRS Duration:  94 QT Interval:  448 QTC Calculation: 428 R Axis:   -27  Text Interpretation: Sinus bradycardia with 1st degree A-V block When compared with ECG of 23-May-2022 11:15, No significant change was found Confirmed by Olga Millers ([id number]) on 11/17/2022 2:09:03 PM    A/P  1 coronary calcification-significant elevation in calcium score.  He does not have significant chest pain though dyspnea with more vigorous activities.  Will arrange stress nuclear study to screen for ischemia.  Add aspirin 81 mg daily.  Continue statin.  2 possible sarcoid with first-degree AV block noted on electrocardiogram-he is undergoing evaluation for pulmonary process that shows  noncaseating granulomas.  Sarcoid is in the differential.  He is being followed by pulmonary.  There is plans for follow-up CT in approximately 3 months.  Will follow closely for any further conduction abnormalities and consider PET scan to rule out cardiac sarcoid if progressive abnormalities noted.  Will check echocardiogram for LV function now.  3 hyperlipidemia-given significant elevation in calcium score will increase Lipitor to 80 mg daily.  Check lipids, liver and LP(a) in 8 weeks.  Goal LDL less than 55.  4 hypertension-blood pressure is controlled.  Continue present medical regimen.  Olga Millers, MD

## 2022-11-06 ENCOUNTER — Encounter: Payer: Self-pay | Admitting: Urology

## 2022-11-06 ENCOUNTER — Ambulatory Visit: Payer: Federal, State, Local not specified - PPO | Admitting: Urology

## 2022-11-06 DIAGNOSIS — Z125 Encounter for screening for malignant neoplasm of prostate: Secondary | ICD-10-CM

## 2022-11-06 DIAGNOSIS — Z09 Encounter for follow-up examination after completed treatment for conditions other than malignant neoplasm: Secondary | ICD-10-CM | POA: Diagnosis not present

## 2022-11-06 DIAGNOSIS — R399 Unspecified symptoms and signs involving the genitourinary system: Secondary | ICD-10-CM

## 2022-11-06 DIAGNOSIS — Z87438 Personal history of other diseases of male genital organs: Secondary | ICD-10-CM

## 2022-11-06 LAB — URINALYSIS, COMPLETE
Bilirubin, UA: NEGATIVE
Glucose, UA: NEGATIVE
Ketones, UA: NEGATIVE
Leukocytes,UA: NEGATIVE
Nitrite, UA: NEGATIVE
Protein,UA: NEGATIVE
RBC, UA: NEGATIVE
Specific Gravity, UA: 1.015 (ref 1.005–1.030)
Urobilinogen, Ur: 0.2 mg/dL (ref 0.2–1.0)
pH, UA: 5.5 (ref 5.0–7.5)

## 2022-11-06 LAB — MICROSCOPIC EXAMINATION: Bacteria, UA: NONE SEEN

## 2022-11-06 NOTE — Progress Notes (Signed)
11/06/2022 4:10 PM   Barbaraann Share, MD 1960/12/03 [id number]  Reason for visit: Follow up HoLEP, urinary symptoms, low risk prostate cancer, LUTS  HPI: 62 year old male followed by Dr. Retta Diones long-term for BPH and very low risk prostate cancer with single core of low risk disease and stable PSA(~7), benign prostate MRI, underwent HOLEP with me on 05/30/2022 for obstructive urinary symptoms. 62g of benign tissue removed.  He originally was doing very well after surgery with 3 to 4 weeks with a very strong stream and no significant symptoms aside from some mild stress incontinence.  He then developed some split or spraying stream, at our clinic visit on 07/23/2022 felt to have a possible subtle fossa navicularis stricture after passing a 14 [place] Coloplast coud catheter.  He catheterized for a few weeks which seem to fix the issue with spraying stream, and has not been catheterizing for the last 3 months.  Overall, he seems to be doing well since that time.  He is off Vesicare at this point.  He has occasional urgency and urge incontinence, no stress incontinence.  Not having to wear a pad anymore at this point.  Does well overnight with nocturia 1 time.  He drinks only water during the day and coffee in the morning.  PVRs have been normal.  Overall, symptoms have improved significantly from prior to surgery.  Urinalysis today is completely benign.  We had another conversation today about postop expectations after HOLEP and differences between BPH, OAB, and overlap.  We discussed options including observation, referral to pelvic floor physical therapy, or trial of an OAB medication like Gemtesa.  Using shared decision making he opted for the trial of Gemtesa, and will call for prescription if he feels this is helpful.  Post-op PSA normal at 1.1, reassurance provided, can continue to check yearly PSA through age 89  -Gemtesa samples -RTC 6 months PVR and symptom check   Sondra Come,  MD  [facility] [address], [address] [place], [place] [address] [phone]

## 2022-11-10 ENCOUNTER — Encounter (HOSPITAL_BASED_OUTPATIENT_CLINIC_OR_DEPARTMENT_OTHER): Payer: Federal, State, Local not specified - PPO

## 2022-11-17 ENCOUNTER — Ambulatory Visit: Payer: Federal, State, Local not specified - PPO | Attending: Cardiology | Admitting: Cardiology

## 2022-11-17 ENCOUNTER — Encounter: Payer: Self-pay | Admitting: Cardiology

## 2022-11-17 VITALS — BP 126/84 | HR 55 | Ht 74.0 in | Wt 204.0 lb

## 2022-11-17 DIAGNOSIS — I6529 Occlusion and stenosis of unspecified carotid artery: Secondary | ICD-10-CM

## 2022-11-17 DIAGNOSIS — I2510 Atherosclerotic heart disease of native coronary artery without angina pectoris: Secondary | ICD-10-CM | POA: Diagnosis not present

## 2022-11-17 DIAGNOSIS — E785 Hyperlipidemia, unspecified: Secondary | ICD-10-CM

## 2022-11-17 DIAGNOSIS — I10 Essential (primary) hypertension: Secondary | ICD-10-CM | POA: Diagnosis not present

## 2022-11-17 MED ORDER — ASPIRIN 81 MG PO TBEC
81.0000 mg | DELAYED_RELEASE_TABLET | Freq: Every day | ORAL | Status: AC
Start: 2022-11-17 — End: ?

## 2022-11-17 MED ORDER — ATORVASTATIN CALCIUM 80 MG PO TABS: 80.0000 mg | ORAL_TABLET | Freq: Every morning | ORAL | 3 refills | Status: DC

## 2022-11-17 NOTE — Patient Instructions (Signed)
Medication Instructions:  Start aspirin 81mg  daily Increase Atorvastatin to 80mg  once a day which equals 2 of the 40 mg  *If you need a refill on your cardiac medications before your next appointment, please call your pharmacy*   Lab Work: Your physician recommends that you return for lab work in: 8 weeks fasting     Testing/Procedures: Your physician has requested that you have an echocardiogram. Echocardiography is a painless test that uses sound waves to create images of your heart. It provides your doctor with information about the size and shape of your heart and how well your heart's chambers and valves are working. This procedure takes approximately one hour. There are no restrictions for this procedure. Please do NOT wear cologne, perfume, aftershave, or lotions (deodorant is allowed). Please arrive 15 minutes prior to your appointment time. [address]   Your physician has requested that you have en exercise stress myoview. For further information please visit [email or web address] Please follow instruction sheet, as given.  [address]    Follow-Up: At [facility], you and your health needs are our priority.  As part of our continuing mission to provide you with exceptional heart care, we have created designated Provider Care Teams.  These Care Teams include your primary Cardiologist (physician) and Advanced Practice Providers (APPs -  Physician Assistants and Nurse Practitioners) who all work together to provide you with the care you need, when you need it.  We recommend signing up for the patient portal called "MyChart".  Sign up information is provided on this After Visit Summary.  MyChart is used to connect with patients for Virtual Visits (Telemedicine).  Patients are able to view lab/test results, encounter notes, upcoming appointments, etc.  Non-urgent messages can be sent to your provider as well.   To learn more about what you can do with MyChart,  go to [email or web address].    Your next appointment:   1 year(s)  Provider:   Olga Millers

## 2022-11-19 LAB — FUNGAL ORGANISM REFLEX

## 2022-11-19 LAB — FUNGUS CULTURE RESULT

## 2022-11-19 LAB — FUNGUS CULTURE WITH STAIN

## 2022-11-20 ENCOUNTER — Telehealth (HOSPITAL_COMMUNITY): Payer: Self-pay

## 2022-11-20 NOTE — Telephone Encounter (Signed)
Detailed instructions left on the patient's answering machine. Asked to call back with any questions. S.Aby Gessel CCT

## 2022-11-24 ENCOUNTER — Ambulatory Visit (HOSPITAL_BASED_OUTPATIENT_CLINIC_OR_DEPARTMENT_OTHER): Payer: Federal, State, Local not specified - PPO | Admitting: Emergency Medicine

## 2022-11-24 ENCOUNTER — Encounter (HOSPITAL_COMMUNITY): Payer: Federal, State, Local not specified - PPO

## 2022-11-24 DIAGNOSIS — R399 Unspecified symptoms and signs involving the genitourinary system: Secondary | ICD-10-CM

## 2022-11-24 DIAGNOSIS — N3941 Urge incontinence: Secondary | ICD-10-CM

## 2022-11-24 DIAGNOSIS — J849 Interstitial pulmonary disease, unspecified: Secondary | ICD-10-CM | POA: Diagnosis not present

## 2022-11-24 LAB — PULMONARY FUNCTION TEST
DL/VA % pred: 97 %
DL/VA: 4.01 ml/min/mmHg/L
DLCO cor % pred: 89 %
DLCO cor: 26.35 ml/min/mmHg
DLCO unc % pred: 90 %
DLCO unc: 26.64 ml/min/mmHg
FEF 25-75 Post: 2.67 L/s
FEF 25-75 Pre: 1.32 L/s
FEF2575-%Change-Post: 101 %
FEF2575-%Pred-Post: 87 %
FEF2575-%Pred-Pre: 43 %
FEV1-%Change-Post: 19 %
FEV1-%Pred-Post: 79 %
FEV1-%Pred-Pre: 66 %
FEV1-Post: 3.08 L
FEV1-Pre: 2.57 L
FEV1FVC-%Change-Post: 13 %
FEV1FVC-%Pred-Pre: 86 %
FEV6-%Change-Post: 7 %
FEV6-%Pred-Post: 83 %
FEV6-%Pred-Pre: 78 %
FEV6-Post: 4.11 L
FEV6-Pre: 3.83 L
FEV6FVC-%Change-Post: 1 %
FEV6FVC-%Pred-Post: 103 %
FEV6FVC-%Pred-Pre: 101 %
FVC-%Change-Post: 5 %
FVC-%Pred-Post: 80 %
FVC-%Pred-Pre: 76 %
FVC-Post: 4.16 L
FVC-Pre: 3.95 L
Post FEV1/FVC ratio: 74 %
Post FEV6/FVC ratio: 99 %
Pre FEV1/FVC ratio: 65 %
Pre FEV6/FVC Ratio: 97 %
RV % pred: 101 %
RV: 2.52 L
TLC % pred: 90 %
TLC: 6.86 L

## 2022-11-24 NOTE — Patient Instructions (Signed)
Full PFT Performed Today  

## 2022-11-24 NOTE — Progress Notes (Signed)
Performed Full PFT Today.   ?

## 2022-11-25 ENCOUNTER — Ambulatory Visit (HOSPITAL_COMMUNITY): Payer: Federal, State, Local not specified - PPO | Attending: Cardiology

## 2022-11-25 DIAGNOSIS — I2510 Atherosclerotic heart disease of native coronary artery without angina pectoris: Secondary | ICD-10-CM | POA: Diagnosis not present

## 2022-11-25 MED ORDER — TECHNETIUM TC 99M TETROFOSMIN IV KIT
8.2000 | PACK | Freq: Once | INTRAVENOUS | Status: AC | PRN
  Administered 2022-11-25: 8.2 via INTRAVENOUS

## 2022-11-25 MED ORDER — MIRABEGRON ER 50 MG PO TB24
50.0000 mg | ORAL_TABLET | Freq: Every day | ORAL | 3 refills | Status: DC
Start: 2022-11-25 — End: 2023-05-13

## 2022-11-25 MED ORDER — TECHNETIUM TC 99M TETROFOSMIN IV KIT
26.3000 | PACK | Freq: Once | INTRAVENOUS | Status: AC | PRN
  Administered 2022-11-25: 26.3 via INTRAVENOUS

## 2022-11-26 LAB — MYOCARDIAL PERFUSION IMAGING
Angina Index: 0
Duke Treadmill Score: 11
Estimated workload: 10.6
Exercise duration (min): 11 min
Exercise duration (sec): 1 s
LV dias vol: 94 mL (ref 62–150)
LV sys vol: 38 mL
MPHR: 156 {beats}/min
Nuc Stress EF: 60 %
Peak HR: 142 {beats}/min
Percent HR: 91 %
Rest HR: 55 {beats}/min
Rest Nuclear Isotope Dose: 8.2 mCi
SDS: 1
SRS: 0
SSS: 1
ST Depression (mm): 0 mm
Stress Nuclear Isotope Dose: 26.3 mCi
TID: 1.02

## 2022-12-03 LAB — ACID FAST CULTURE WITH REFLEXED SENSITIVITIES (MYCOBACTERIA): Acid Fast Culture: NEGATIVE

## 2022-12-05 HISTORY — PX: REPLACEMENT TOTAL KNEE: SUR1224

## 2022-12-08 ENCOUNTER — Ambulatory Visit: Payer: Federal, State, Local not specified - PPO | Admitting: Family Medicine

## 2022-12-08 ENCOUNTER — Encounter: Payer: Self-pay | Admitting: Family Medicine

## 2022-12-08 VITALS — BP 110/68 | HR 55 | Temp 98.3°F | Resp 18 | Ht 74.0 in | Wt 203.6 lb

## 2022-12-08 DIAGNOSIS — Z01818 Encounter for other preprocedural examination: Secondary | ICD-10-CM | POA: Diagnosis not present

## 2022-12-08 DIAGNOSIS — M1A0790 Idiopathic chronic gout, unspecified ankle and foot, without tophus (tophi): Secondary | ICD-10-CM

## 2022-12-08 DIAGNOSIS — K219 Gastro-esophageal reflux disease without esophagitis: Secondary | ICD-10-CM | POA: Diagnosis not present

## 2022-12-08 DIAGNOSIS — I10 Essential (primary) hypertension: Secondary | ICD-10-CM

## 2022-12-08 LAB — CBC WITH DIFFERENTIAL/PLATELET
Basophils Absolute: 0.1 10*3/uL (ref 0.0–0.1)
Basophils Relative: 1.1 % (ref 0.0–3.0)
Eosinophils Absolute: 0.5 10*3/uL (ref 0.0–0.7)
Eosinophils Relative: 10 % — ABNORMAL HIGH (ref 0.0–5.0)
HCT: 42.2 % (ref 39.0–52.0)
Hemoglobin: 13.8 g/dL (ref 13.0–17.0)
Lymphocytes Relative: 25.7 % (ref 12.0–46.0)
Lymphs Abs: 1.2 10*3/uL (ref 0.7–4.0)
MCHC: 32.8 g/dL (ref 30.0–36.0)
MCV: 92.1 fL (ref 78.0–100.0)
Monocytes Absolute: 0.5 10*3/uL (ref 0.1–1.0)
Monocytes Relative: 10.7 % (ref 3.0–12.0)
Neutro Abs: 2.6 10*3/uL (ref 1.4–7.7)
Neutrophils Relative %: 52.5 % (ref 43.0–77.0)
Platelets: 272 10*3/uL (ref 150.0–400.0)
RBC: 4.59 Mil/uL (ref 4.22–5.81)
RDW: 15.1 % (ref 11.5–15.5)
WBC: 4.9 10*3/uL (ref 4.0–10.5)

## 2022-12-08 LAB — BASIC METABOLIC PANEL
BUN: 18 mg/dL (ref 6–23)
CO2: 29 meq/L (ref 19–32)
Calcium: 10.3 mg/dL (ref 8.4–10.5)
Chloride: 101 meq/L (ref 96–112)
Creatinine, Ser: 0.99 mg/dL (ref 0.40–1.50)
GFR: 80.64 mL/min (ref >60.00)
Glucose, Bld: 92 mg/dL (ref 70–99)
Potassium: 4.2 meq/L (ref 3.5–5.1)
Sodium: 140 meq/L (ref 135–145)

## 2022-12-08 MED ORDER — NEBIVOLOL HCL 10 MG PO TABS
10.0000 mg | ORAL_TABLET | Freq: Every day | ORAL | 1 refills | Status: DC
Start: 2022-12-08 — End: 2022-12-19

## 2022-12-08 MED ORDER — OLMESARTAN MEDOXOMIL-HCTZ 40-12.5 MG PO TABS: 1.0000 | ORAL_TABLET | Freq: Every day | ORAL | 1 refills | Status: DC

## 2022-12-08 MED ORDER — ALLOPURINOL 300 MG PO TABS
300.0000 mg | ORAL_TABLET | Freq: Every day | ORAL | 1 refills | Status: DC
Start: 2022-12-08 — End: 2022-12-19

## 2022-12-08 MED ORDER — OMEPRAZOLE 40 MG PO CPDR
40.0000 mg | DELAYED_RELEASE_CAPSULE | Freq: Every day | ORAL | 1 refills | Status: DC
Start: 2022-12-08 — End: 2022-12-19

## 2022-12-08 NOTE — Progress Notes (Deleted)
   Established Patient Office Visit  Subjective   Patient ID: Barbaraann Share, MD, male    DOB: 11/19/1960  Age: 62 y.o. MRN: [id number]  Chief Complaint  Patient presents with   Pre-op Exam    Right total knee replacement     HPI  {History (Optional):23778}  ROS    Objective:     BP 110/68 (BP Location: Left Arm, Patient Position: Sitting, Cuff Size: Normal)   Pulse (!) 55   Temp 98.3 F (36.8 C) (Oral)   Resp 18   Ht 6\' 2"  (1.88 m)   Wt 203 lb 9.6 oz (92.4 kg)   SpO2 98%   BMI 26.14 kg/m  {Vitals History (Optional):23777}  Physical Exam   No results found for any visits on 12/08/22.  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    Donato Schultz, DO

## 2022-12-08 NOTE — Progress Notes (Signed)
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 This consultation is requested for the specific conditions prompting preoperative evaluation (i.e. because of potential affect on operative risk): Asthma . Planned anesthesia: general. The patient has the following known anesthesia issues:  n/a . Patients bleeding risk: no recent abnormal bleeding. Discussed the use of AI scribe software for clinical note transcription with the patient, who gave verbal consent to proceed.  History of Present Illness   The patient, with a history of asthma, is scheduled for a right knee replacement surgery due to longstanding bone-on-bone arthritis. He has been experiencing increasing difficulty with mobility, particularly with walking downhill and downstairs. The patient is highly active and wishes to maintain his lifestyle without interruption, hence the decision to proceed with surgery.  In addition to the knee issue, the patient recently underwent a cardiac CT scan and bronchoscopy. The cardiac CT revealed calcifications, but a subsequent stress test was negative. The bronchoscopy was performed due to unidentified findings on the chest from the cardiac CT. The bronchoscopy results showed granulomatous inflammation, which could potentially indicate sarcoidosis, although this is uncertain due to the patient's age and lack of lymphadenopathy on the CT scan. The patient is asymptomatic from this condition, with pulmonary function tests (PFTs) showing only mild airflow obstruction due to asthma, which is reversible with bronchodilators.  The patient also discusses his lipid management. He has been on cholesterol medication since his twenties and recently had his Lipitor dosage increased from 40mg  to 80mg .      The following portions of the patient's  history were reviewed and updated as appropriate: He  has a past medical history of Arthritis, BPH (benign prostatic hypertrophy), GERD (gastroesophageal reflux disease), H/O hiatal hernia, History of colon polyps, History of kidney stones, Hyperlipidemia, Hypertension, Knee internal derangement, Macular pucker, right eye (07/05/2019), Mild asthma, Nuclear sclerotic cataract of left eye (03/26/2021), Personal history of COVID-19 (08/2020), Right inguinal hernia, Vitamin D deficiency, and Wears glasses. He does not have any pertinent problems on file. He  has a past surgical history that includes Lumbar disc surgery (04/02/2007); Knee arthroscopy (Right, X4  last one 2005); Parathyroidectomy (02/04/1987); Esophagogastroduodenoscopy (11/15/2008); Colonoscopy w/ polypectomy (01/21/2008); NEGATIVE SLEEP STUDY (per pt); Knee arthroscopy (Right, 12/06/2013); Eye surgery (Bilateral); Inguinal hernia repair (Right, 08/08/2021); Holep-laser enucleation of the prostate with morcellation (N/A, 05/30/2022); Colonoscopy (09/27/2020); Prostate surgery (05/2022); Bronchial biopsy (10/20/2022); Bronchial brushings (10/20/2022); Bronchial needle aspiration biopsy (10/20/2022); and Bronchial washings (10/20/2022). His family history includes Colon cancer in his father; Coronary artery disease in his mother; Hyperlipidemia in his brother and mother; Pancreatic cancer in his brother; Pulmonary embolism in his father. He  reports that he has never smoked. He has never been exposed to tobacco smoke. He has never used smokeless tobacco. He reports current alcohol use. He reports that he does not use drugs. He has a current medication list which includes the following prescription(s): aspirin ec, atorvastatin, fluorouracil, fluticasone, ibuprofen, levalbuterol, mirabegron er, terbinafine, vitamin d, allopurinol, nebivolol, olmesartan-hydrochlorothiazide, and omeprazole. Current Outpatient Medications on File Prior to Visit  Medication  Sig Dispense Refill   aspirin EC 81 MG tablet Take 1 tablet (81 mg total) by mouth daily. Swallow whole.     atorvastatin (LIPITOR) 80 MG tablet Take 1 tablet (80 mg total) by mouth every morning. 90  tablet 3   fluorouracil (EFUDEX) 5 % cream Apply to face, scalp and tops of ears bid for 2 wks 40 g 2   fluticasone (FLONASE) 50 MCG/ACT nasal spray Place 2 sprays into both nostrils daily as needed for allergies or rhinitis.     ibuprofen (ADVIL) 600 MG tablet Take 600 mg by mouth every 6 (six) hours as needed.     levalbuterol (XOPENEX) 0.63 MG/3ML nebulizer solution Take 3 mLs (0.63 mg total) by nebulization every 6 (six) hours as needed for wheezing or shortness of breath. 90 mL 5   mirabegron ER (MYRBETRIQ) 50 MG TB24 tablet Take 1 tablet (50 mg total) by mouth daily. 90 tablet 3   terbinafine (LAMISIL) 250 MG tablet Take 1 tablet (250 mg total) by mouth daily. 90 tablet 1   VITAMIN D PO Take 2 capsules by mouth daily.     No current facility-administered medications on file prior to visit.   He is allergic to merthiolate [thimerosal (thiomersal)]..  Review of Systems Review of Systems  Constitutional: Negative for activity change, appetite change and fatigue.  HENT: Negative for hearing loss, congestion, tinnitus and ear discharge.  dentist q35m  Respiratory: Negative for cough, chest tightness and shortness of breath.   Cardiovascular: Negative for chest pain, palpitations and leg swelling.  Gastrointestinal: Negative for abdominal pain, diarrhea, constipation and abdominal distention.  Genitourinary: Negative for urgency, frequency, decreased urine volume and difficulty urinating.  Musculoskeletal: + r knee pain Skin: Negative for color change, pallor and rash.  Neurological: Negative for dizziness, light-headedness, numbness and headaches.  Hematological: Negative for adenopathy. Does not bruise/bleed easily.  Psychiatric/Behavioral: Negative for suicidal ideas, confusion, sleep  disturbance, self-injury, dysphoric mood, decreased concentration and agitation.       Objective:    BP 110/68 (BP Location: Left Arm, Patient Position: Sitting, Cuff Size: Normal)   Pulse (!) 55   Temp 98.3 F (36.8 C) (Oral)   Resp 18   Ht 6\' 2"  (1.88 m)   Wt 203 lb 9.6 oz (92.4 kg)   SpO2 98%   BMI 26.14 kg/m  General appearance: alert, cooperative, and appears stated age Head: Normocephalic, without obvious abnormality, atraumatic Ears: normal TM's and external ear canals both ears Nose: Nares normal. Septum midline. Mucosa normal. No drainage or sinus tenderness. Throat: lips, mucosa, and tongue normal; teeth and gums normal Neck: no adenopathy, no carotid bruit, no JVD, supple, symmetrical, trachea midline, and thyroid not enlarged, symmetric, no tenderness/mass/nodules Back: symmetric, no curvature. ROM normal. No CVA tenderness. Lungs: clear to auscultation bilaterally Heart: regular rate and rhythm, S1, S2 normal, no murmur, click, rub or gallop Abdomen: soft, non-tender; bowel sounds normal; no masses,  no organomegaly  Cardiographics ECG: no change since previous ECG dated 06/02/22 Echocardiogram:  to be done  Imaging Chest x-ray: normal        Assessment:      62 y.o. male with planned surgery as above.   Known risk factors for perioperative complications: None   Difficulty with intubation is not anticipated.   Current medications which may produce withdrawal symptoms if withheld perioperatively:    Plan:    1. Preoperative workup as follows hemoglobin, hematocrit, electrolytes, creatinine, glucose, liver function studies. 2. Deep vein thrombosis prophylaxis postoperatively:regimen to be chosen by surgical team. 6. Surveillance for postoperative MI with ECG immediately postoperatively and on postoperative days 1 and 2 AND troponin levels 24 hours postoperatively and on day 4 or hospital discharge (whichever comes first): at the  discretion of  anesthesiologist. 7. Other measures: -- cardio/ pulm clearance

## 2022-12-09 ENCOUNTER — Telehealth: Payer: Self-pay

## 2022-12-09 DIAGNOSIS — M1711 Unilateral primary osteoarthritis, right knee: Secondary | ICD-10-CM | POA: Diagnosis not present

## 2022-12-09 DIAGNOSIS — M25561 Pain in right knee: Secondary | ICD-10-CM | POA: Diagnosis not present

## 2022-12-09 DIAGNOSIS — M25661 Stiffness of right knee, not elsewhere classified: Secondary | ICD-10-CM | POA: Diagnosis not present

## 2022-12-09 NOTE — Telephone Encounter (Signed)
Pts clearance form and labs faxed

## 2022-12-10 ENCOUNTER — Encounter: Payer: Self-pay | Admitting: Emergency Medicine

## 2022-12-11 ENCOUNTER — Other Ambulatory Visit: Payer: Self-pay | Admitting: Family Medicine

## 2022-12-11 DIAGNOSIS — M1A0790 Idiopathic chronic gout, unspecified ankle and foot, without tophus (tophi): Secondary | ICD-10-CM

## 2022-12-11 DIAGNOSIS — K219 Gastro-esophageal reflux disease without esophagitis: Secondary | ICD-10-CM

## 2022-12-11 DIAGNOSIS — I10 Essential (primary) hypertension: Secondary | ICD-10-CM

## 2022-12-11 MED ORDER — BUDESONIDE-FORMOTEROL FUMARATE 160-4.5 MCG/ACT IN AERO: 2.0000 | INHALATION_SPRAY | Freq: Two times a day (BID) | RESPIRATORY_TRACT | 6 refills | Status: DC

## 2022-12-11 NOTE — Telephone Encounter (Signed)
I will order symbicort, start w 2 puffs every day and see if he needs to increase to bid.

## 2022-12-13 ENCOUNTER — Encounter: Payer: Self-pay | Admitting: Family Medicine

## 2022-12-13 DIAGNOSIS — K219 Gastro-esophageal reflux disease without esophagitis: Secondary | ICD-10-CM

## 2022-12-13 DIAGNOSIS — M1A0790 Idiopathic chronic gout, unspecified ankle and foot, without tophus (tophi): Secondary | ICD-10-CM

## 2022-12-13 DIAGNOSIS — I10 Essential (primary) hypertension: Secondary | ICD-10-CM

## 2022-12-16 ENCOUNTER — Ambulatory Visit (HOSPITAL_COMMUNITY): Payer: Federal, State, Local not specified - PPO | Attending: Cardiology

## 2022-12-16 DIAGNOSIS — I2510 Atherosclerotic heart disease of native coronary artery without angina pectoris: Secondary | ICD-10-CM | POA: Diagnosis not present

## 2022-12-16 LAB — ECHOCARDIOGRAM COMPLETE
Area-P 1/2: 3.42 cm2
Calc EF: 63.3 %
S' Lateral: 3.4 cm
Single Plane A2C EF: 64 %
Single Plane A4C EF: 62.8 %

## 2022-12-19 MED ORDER — OMEPRAZOLE 40 MG PO CPDR: 40.0000 mg | DELAYED_RELEASE_CAPSULE | Freq: Every day | ORAL | 1 refills | Status: DC

## 2022-12-19 MED ORDER — NEBIVOLOL HCL 10 MG PO TABS: 10.0000 mg | ORAL_TABLET | Freq: Every day | ORAL | 1 refills | Status: DC

## 2022-12-19 MED ORDER — ALLOPURINOL 300 MG PO TABS: 300.0000 mg | ORAL_TABLET | Freq: Every day | ORAL | 1 refills | Status: DC

## 2022-12-19 MED ORDER — FLUTICASONE PROPIONATE 50 MCG/ACT NA SUSP: 2.0000 | Freq: Every day | NASAL | 2 refills | Status: DC | PRN

## 2022-12-30 DIAGNOSIS — M25761 Osteophyte, right knee: Secondary | ICD-10-CM | POA: Diagnosis not present

## 2022-12-30 DIAGNOSIS — G8918 Other acute postprocedural pain: Secondary | ICD-10-CM | POA: Diagnosis not present

## 2022-12-30 DIAGNOSIS — Z96651 Presence of right artificial knee joint: Secondary | ICD-10-CM | POA: Diagnosis not present

## 2022-12-30 DIAGNOSIS — M1711 Unilateral primary osteoarthritis, right knee: Secondary | ICD-10-CM | POA: Diagnosis not present

## 2023-01-05 ENCOUNTER — Encounter: Payer: Self-pay | Admitting: Family Medicine

## 2023-01-05 DIAGNOSIS — M25661 Stiffness of right knee, not elsewhere classified: Secondary | ICD-10-CM | POA: Diagnosis not present

## 2023-01-05 DIAGNOSIS — M25561 Pain in right knee: Secondary | ICD-10-CM | POA: Diagnosis not present

## 2023-01-05 NOTE — Telephone Encounter (Signed)
 Care team updated and letter sent for eye exam notes.

## 2023-01-07 ENCOUNTER — Encounter: Payer: Self-pay | Admitting: Dermatology

## 2023-01-07 ENCOUNTER — Ambulatory Visit: Payer: Federal, State, Local not specified - PPO | Admitting: Dermatology

## 2023-01-07 DIAGNOSIS — M25661 Stiffness of right knee, not elsewhere classified: Secondary | ICD-10-CM | POA: Diagnosis not present

## 2023-01-07 DIAGNOSIS — Z5181 Encounter for therapeutic drug level monitoring: Secondary | ICD-10-CM | POA: Diagnosis not present

## 2023-01-07 DIAGNOSIS — Z79899 Other long term (current) drug therapy: Secondary | ICD-10-CM

## 2023-01-07 DIAGNOSIS — B353 Tinea pedis: Secondary | ICD-10-CM | POA: Diagnosis not present

## 2023-01-07 DIAGNOSIS — B351 Tinea unguium: Secondary | ICD-10-CM | POA: Diagnosis not present

## 2023-01-07 DIAGNOSIS — M25561 Pain in right knee: Secondary | ICD-10-CM | POA: Diagnosis not present

## 2023-01-07 NOTE — Patient Instructions (Addendum)
Hello Dr. Shelle Iron,  Thank you for visiting my office today. Your dedication to managing your skin and nail conditions and improving your overall health is greatly appreciated. Here is a summary of the key instructions and recommendations from today's consultation:  - Lamisil (Terbinafine): Continue taking Lamisil for the remaining 19 days.  - Liver Function Test: Complete the scheduled liver function test next week.  - Post-Lamisil Topical Treatment: We Considered using a topical treatment like Jublia or Kerydin after completing Lamisil to prevent the recurrence of the fungus.  However, we could also extend the course of oral Terbinafine for 3 more months after.     - Shoe Care: Continue spraying your shoes with Dr. Margart Sickles antifungal (tolnaftate 1%) a few times a week to prevent re-infection.  - Future Treatment Plan: Depending on your lab results, we may start terbinafine on an every-other-day dosing for two to three months.  - Communication: Keep me updated via MyChart once your lab results are in. I will provide further instructions based on those results.  Thank you for your proactive approach to your health. I look forward to our next visit and continuing to support your journey to recovery.  Warm regards,  Dr. Langston Reusing,  Dermatology      Important Information  Due to recent changes in healthcare laws, you may see results of your pathology and/or laboratory studies on MyChart before the doctors have had a chance to review them. We understand that in some cases there may be results that are confusing or concerning to you. Please understand that not all results are received at the same time and often the doctors may need to interpret multiple results in order to provide you with the best plan of care or course of treatment. Therefore, we ask that you please give [place] 2 business days to thoroughly review all your results before contacting the office for clarification.  Should we see a critical lab result, you will be contacted sooner.   If You Need Anything After Your Visit  If you have any questions or concerns for your doctor, please call our main line at [phone] If no one answers, please leave a voicemail as directed and we will return your call as soon as possible. Messages left after 4 pm will be answered the following business day.   You may also send [place] a message via MyChart. We typically respond to MyChart messages within 1-2 business days.  For prescription refills, please ask your pharmacy to contact our office. Our fax number is [phone].  If you have an urgent issue when the clinic is closed that cannot wait until the next business day, you can page your doctor at the number below.    Please note that while we do our best to be available for urgent issues outside of office hours, we are not available 24/7.   If you have an urgent issue and are unable to reach [place], you may choose to seek medical care at your doctor's office, retail clinic, urgent care center, or emergency room.  If you have a medical emergency, please immediately call 911 or go to the emergency department. In the event of inclement weather, please call our main line at [phone] for an update on the status of any delays or closures.  Dermatology Medication Tips: Please keep the boxes that topical medications come in in order to help keep track of the instructions about where and how to use these. Pharmacies typically print  the medication instructions only on the boxes and not directly on the medication tubes.   If your medication is too expensive, please contact our office at [phone] or send [place] a message through MyChart.   We are unable to tell what your co-pay for medications will be in advance as this is different depending on your insurance coverage. However, we may be able to find a substitute medication at lower cost or fill out paperwork to get insurance to  cover a needed medication.   If a prior authorization is required to get your medication covered by your insurance company, please allow [place] 1-2 business days to complete this process.  Drug prices often vary depending on where the prescription is filled and some pharmacies may offer cheaper prices.  The website www.goodrx.com contains coupons for medications through different pharmacies. The prices here do not account for what the cost may be with help from insurance (it may be cheaper with your insurance), but the website can give you the price if you did not use any insurance.  - You can print the associated coupon and take it with your prescription to the pharmacy.  - You may also stop by our office during regular business hours and pick up a GoodRx coupon card.  - If you need your prescription sent electronically to a different pharmacy, notify our office through [facility] or by phone at [phone]

## 2023-01-07 NOTE — Progress Notes (Signed)
   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 Patient was last evaluated on 10/28/22. Rx Terbinafine 250mg  - take one daily for 23mo. Patient reports sxs are better. He can tell the difference in the skin surrounding the nails mainly. Patient denies medication changes.  The following portions of the chart were reviewed this encounter and updated as appropriate: medications, allergies, medical history  Review of Systems:  No other skin or systemic complaints except as noted in HPI or Assessment and Plan.  Objective  Well appearing patient in no apparent distress; mood and affect are within normal limits.   A focused examination was performed of the following areas:   Relevant exam findings are noted in the Assessment and Plan.        Assessment & Plan   Onychomycosis and Tinea Pedis - Assessment: Patient has been undergoing treatment with terbinafine (Lamisil) for toenail fungus for 3 months, with 19 days left of treatment. Previous treatments included itraconazole orally for about six months and various topical creams for cutaneous fungus, including past use of Jublia. Recent photos indicate significant improvement, with nails growing out healthily. The patient reports feeling great. - Plan:   - Continue terbinafine for the remaining 19 days.   - Pending liver function test results, consider extending terbinafine treatment to every other day dosing for an additional 3 months.   - Patient to complete scheduled liver function test next week and notify office of results via MyChart.   - Continue using antifungal spray (e.g., tolnaftate 1%) in shoes a few times a week until clear of infection.   - Recommend moisturizing feet with CeraVe after showers for exfoliation and hydration.   - Anticipate full growth of healthy big toenails in approximately 6 months, with  less time for smaller toenails.   - Monitor for potential need of topical treatment (e.g., Jublia) as a preventive measure against recurrence.    No follow-ups on file.    Documentation: I have reviewed the above documentation for accuracy and completeness, and I agree with the above.   I, [name], CMA, am acting as scribe for [employer], DO.   Jason Reusing, DO

## 2023-01-12 DIAGNOSIS — M25661 Stiffness of right knee, not elsewhere classified: Secondary | ICD-10-CM | POA: Diagnosis not present

## 2023-01-12 DIAGNOSIS — M25561 Pain in right knee: Secondary | ICD-10-CM | POA: Diagnosis not present

## 2023-01-14 DIAGNOSIS — M25561 Pain in right knee: Secondary | ICD-10-CM | POA: Diagnosis not present

## 2023-01-14 DIAGNOSIS — M25661 Stiffness of right knee, not elsewhere classified: Secondary | ICD-10-CM | POA: Diagnosis not present

## 2023-01-16 DIAGNOSIS — E785 Hyperlipidemia, unspecified: Secondary | ICD-10-CM | POA: Diagnosis not present

## 2023-01-16 DIAGNOSIS — I2510 Atherosclerotic heart disease of native coronary artery without angina pectoris: Secondary | ICD-10-CM | POA: Diagnosis not present

## 2023-01-17 ENCOUNTER — Encounter: Payer: Self-pay | Admitting: Dermatology

## 2023-01-17 DIAGNOSIS — B353 Tinea pedis: Secondary | ICD-10-CM

## 2023-01-18 LAB — HEPATIC FUNCTION PANEL
ALT: 38 [IU]/L (ref 0–44)
AST: 31 [IU]/L (ref 0–40)
Albumin: 4.2 g/dL (ref 3.9–4.9)
Alkaline Phosphatase: 94 [IU]/L (ref 44–121)
Bilirubin Total: 0.4 mg/dL (ref 0.0–1.2)
Bilirubin, Direct: 0.14 mg/dL (ref 0.00–0.40)
Total Protein: 6.8 g/dL (ref 6.0–8.5)

## 2023-01-18 LAB — LIPID PANEL
Chol/HDL Ratio: 4.7 {ratio} (ref 0.0–5.0)
Cholesterol, Total: 177 mg/dL (ref 100–199)
HDL: 38 mg/dL — ABNORMAL LOW (ref >39)
LDL Chol Calc (NIH): 116 mg/dL — ABNORMAL HIGH (ref 0–99)
Triglycerides: 126 mg/dL (ref 0–149)
VLDL Cholesterol Cal: 23 mg/dL (ref 5–40)

## 2023-01-18 LAB — LIPOPROTEIN A (LPA): Lipoprotein (a): 225.8 nmol/L — ABNORMAL HIGH (ref <75.0)

## 2023-01-19 MED ORDER — TERBINAFINE HCL 250 MG PO TABS: 250.0000 mg | ORAL_TABLET | ORAL | 0 refills | Status: DC

## 2023-01-19 NOTE — Telephone Encounter (Signed)
Hi Shirron,  Please inform pt that his LFT's were all WNL and he can continue the terninafine taking one tablet every other day for 3 more months.  Please send his refill as well.  Thank you!

## 2023-01-20 ENCOUNTER — Encounter: Payer: Self-pay | Admitting: *Deleted

## 2023-01-20 ENCOUNTER — Other Ambulatory Visit: Payer: Self-pay | Admitting: *Deleted

## 2023-01-20 DIAGNOSIS — I2510 Atherosclerotic heart disease of native coronary artery without angina pectoris: Secondary | ICD-10-CM

## 2023-01-20 DIAGNOSIS — E785 Hyperlipidemia, unspecified: Secondary | ICD-10-CM

## 2023-01-21 ENCOUNTER — Encounter: Payer: Self-pay | Admitting: *Deleted

## 2023-01-21 DIAGNOSIS — M25561 Pain in right knee: Secondary | ICD-10-CM | POA: Diagnosis not present

## 2023-01-21 DIAGNOSIS — E785 Hyperlipidemia, unspecified: Secondary | ICD-10-CM

## 2023-01-21 DIAGNOSIS — M25661 Stiffness of right knee, not elsewhere classified: Secondary | ICD-10-CM | POA: Diagnosis not present

## 2023-01-21 MED ORDER — EZETIMIBE 10 MG PO TABS
10.0000 mg | ORAL_TABLET | Freq: Every day | ORAL | 3 refills | Status: AC
Start: 2023-01-21 — End: ?

## 2023-01-22 MED ORDER — TERBINAFINE HCL 250 MG PO TABS: 250.0000 mg | ORAL_TABLET | ORAL | 0 refills | Status: DC

## 2023-01-22 NOTE — Addendum Note (Signed)
Addended by: Darryll Capers on: 01/22/2023 09:44 AM   Modules accepted: Orders

## 2023-01-23 DIAGNOSIS — M25661 Stiffness of right knee, not elsewhere classified: Secondary | ICD-10-CM | POA: Diagnosis not present

## 2023-01-23 DIAGNOSIS — M25561 Pain in right knee: Secondary | ICD-10-CM | POA: Diagnosis not present

## 2023-01-26 DIAGNOSIS — M25561 Pain in right knee: Secondary | ICD-10-CM | POA: Diagnosis not present

## 2023-01-26 DIAGNOSIS — M25661 Stiffness of right knee, not elsewhere classified: Secondary | ICD-10-CM | POA: Diagnosis not present

## 2023-01-30 ENCOUNTER — Ambulatory Visit
Admission: RE | Admit: 2023-01-30 | Discharge: 2023-01-30 | Disposition: A | Discharge status: Home / Self Care | Payer: Federal, State, Local not specified - PPO | Source: Ambulatory Visit | Attending: Emergency Medicine | Admitting: Emergency Medicine

## 2023-01-30 DIAGNOSIS — M25661 Stiffness of right knee, not elsewhere classified: Secondary | ICD-10-CM | POA: Diagnosis not present

## 2023-01-30 DIAGNOSIS — J849 Interstitial pulmonary disease, unspecified: Secondary | ICD-10-CM | POA: Diagnosis not present

## 2023-01-30 DIAGNOSIS — R9389 Abnormal findings on diagnostic imaging of other specified body structures: Secondary | ICD-10-CM

## 2023-01-30 DIAGNOSIS — I6529 Occlusion and stenosis of unspecified carotid artery: Secondary | ICD-10-CM | POA: Diagnosis not present

## 2023-01-30 DIAGNOSIS — I10 Essential (primary) hypertension: Secondary | ICD-10-CM | POA: Diagnosis not present

## 2023-01-30 DIAGNOSIS — M25561 Pain in right knee: Secondary | ICD-10-CM | POA: Diagnosis not present

## 2023-01-30 DIAGNOSIS — R918 Other nonspecific abnormal finding of lung field: Secondary | ICD-10-CM | POA: Diagnosis not present

## 2023-01-30 MED ORDER — IOPAMIDOL (ISOVUE-300) INJECTION 61%
100.0000 mL | Freq: Once | INTRAVENOUS | Status: AC | PRN
  Administered 2023-01-30: 75 mL via INTRAVENOUS

## 2023-01-31 ENCOUNTER — Encounter: Payer: Self-pay | Admitting: Emergency Medicine

## 2023-01-31 ENCOUNTER — Other Ambulatory Visit: Payer: Self-pay | Admitting: Emergency Medicine

## 2023-01-31 DIAGNOSIS — R600 Localized edema: Secondary | ICD-10-CM

## 2023-01-31 NOTE — Telephone Encounter (Signed)
I placed orders for Dr Shelle Iron to have bilateral LE doppler US. This is his availability. Thanks.

## 2023-01-31 NOTE — Progress Notes (Signed)
Subsegmental PE spuriously noted on CT scan of the chest.  Messaged Dr. Shelle Iron and he is asymptomatic.  We will plan to perform bilateral lower extremity Dopplers to evaluate for occult DVT as he is recently S/P TKR.

## 2023-02-02 ENCOUNTER — Other Ambulatory Visit: Payer: Self-pay | Admitting: Emergency Medicine

## 2023-02-02 ENCOUNTER — Ambulatory Visit (HOSPITAL_COMMUNITY)
Admission: RE | Admit: 2023-02-02 | Discharge: 2023-02-02 | Disposition: A | Discharge status: Home / Self Care | Payer: Federal, State, Local not specified - PPO | Source: Ambulatory Visit | Attending: Emergency Medicine | Admitting: Emergency Medicine

## 2023-02-02 DIAGNOSIS — I2693 Single subsegmental pulmonary embolism without acute cor pulmonale: Secondary | ICD-10-CM

## 2023-02-02 DIAGNOSIS — R600 Localized edema: Secondary | ICD-10-CM | POA: Insufficient documentation

## 2023-02-02 NOTE — Progress Notes (Signed)
I discussed the CT scan of the chest with Dr. Shelle Iron by phone.  There is a single small subsegmental PE noted spuriously on his CT scan of the chest.  His pulmonary infiltrates are unchanged compared with prior.  He is recently status post TKR.  Dopplers of the lower extremities are negative for DVT.  We have decided to perform a CT-PA to ensure that there is no additional clot burden that would compel [place] to treat with anticoagulation.

## 2023-02-02 NOTE — Progress Notes (Signed)
Bilateral lower extremity venous duplex has been completed. Preliminary results can be found in CV Proc through chart review.  Results were given to Dr. Delton Coombes.  02/02/23 3:12 PM Olen Cordial RVT

## 2023-02-03 ENCOUNTER — Ambulatory Visit
Admission: RE | Admit: 2023-02-03 | Discharge: 2023-02-03 | Disposition: A | Discharge status: Home / Self Care | Payer: Federal, State, Local not specified - PPO | Source: Ambulatory Visit | Attending: Emergency Medicine | Admitting: Emergency Medicine

## 2023-02-03 DIAGNOSIS — I2694 Multiple subsegmental pulmonary emboli without acute cor pulmonale: Secondary | ICD-10-CM | POA: Diagnosis not present

## 2023-02-03 DIAGNOSIS — I2693 Single subsegmental pulmonary embolism without acute cor pulmonale: Secondary | ICD-10-CM

## 2023-02-03 DIAGNOSIS — J984 Other disorders of lung: Secondary | ICD-10-CM | POA: Diagnosis not present

## 2023-02-03 DIAGNOSIS — M25661 Stiffness of right knee, not elsewhere classified: Secondary | ICD-10-CM | POA: Diagnosis not present

## 2023-02-03 DIAGNOSIS — M25561 Pain in right knee: Secondary | ICD-10-CM | POA: Diagnosis not present

## 2023-02-03 MED ORDER — IOPAMIDOL (ISOVUE-370) INJECTION 76%
75.0000 mL | Freq: Once | INTRAVENOUS | Status: AC | PRN
  Administered 2023-02-03: 75 mL via INTRAVENOUS

## 2023-02-06 DIAGNOSIS — M25661 Stiffness of right knee, not elsewhere classified: Secondary | ICD-10-CM | POA: Diagnosis not present

## 2023-02-06 DIAGNOSIS — M25561 Pain in right knee: Secondary | ICD-10-CM | POA: Diagnosis not present

## 2023-02-09 ENCOUNTER — Other Ambulatory Visit: Payer: Federal, State, Local not specified - PPO

## 2023-02-09 DIAGNOSIS — M25561 Pain in right knee: Secondary | ICD-10-CM | POA: Diagnosis not present

## 2023-02-09 DIAGNOSIS — M25661 Stiffness of right knee, not elsewhere classified: Secondary | ICD-10-CM | POA: Diagnosis not present

## 2023-02-12 ENCOUNTER — Encounter: Payer: Self-pay | Admitting: Emergency Medicine

## 2023-02-12 ENCOUNTER — Ambulatory Visit: Payer: Federal, State, Local not specified - PPO | Admitting: Emergency Medicine

## 2023-02-12 VITALS — BP 130/78 | HR 57 | Temp 98.0°F | Ht 74.0 in | Wt 205.0 lb

## 2023-02-12 DIAGNOSIS — I2699 Other pulmonary embolism without acute cor pulmonale: Secondary | ICD-10-CM | POA: Insufficient documentation

## 2023-02-12 DIAGNOSIS — I2694 Multiple subsegmental pulmonary emboli without acute cor pulmonale: Secondary | ICD-10-CM | POA: Diagnosis not present

## 2023-02-12 DIAGNOSIS — J8410 Pulmonary fibrosis, unspecified: Secondary | ICD-10-CM | POA: Diagnosis not present

## 2023-02-12 NOTE — Patient Instructions (Addendum)
 We reviewed your CT-PA today. We will hold off on anti-coagulation for now.  Please call if you decide you would like to start.  We would probably use DOAC for 3 months. We will repeat your LE doppler in late January  We will consider timing of hypercoag labs and repeat chest imaging when we follow up  Follow with Dr Shelah in 6 months or sooner if you have any problems

## 2023-02-12 NOTE — Progress Notes (Signed)
 Subjective:    Patient ID: Jason CHRISTELLA [name], MD, male    DOB: 21-Jun-1960, 63 y.o.   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  He has a history of intermittent asthma.  I saw him after he was spuriously found to have scattered nodular infiltrates on CT scan of the chest.  He underwent navigational bronchoscopy with biopsies and BAL on 10/20/2022.  His transbronchial needle biopsies showed noncaseating granulomas.  His AFB and fungal smears are negative, cultures are negative so far but not final.  His HSP panel, QuantiFERON gold, autoimmune labs are all negative.  He did have elevated percent peripheral eosinophils but the absolute count was normal. Question dx sarcoidosis as he presenting at unusual age. ? Possibly related to an aspiration event, there are case reports of granulomas being brought on by covid vaccine. ? Foreign body, ? Aspiration   ROV 02/12/2023 --63 year old gentleman, never smoker who follows up today for his history of intermittent asthma and also apparent sarcoidosis based on robotic assisted navigational bronchoscopy biopsies and BAL performed to evaluate scattered nodular infiltrates that were spuriously found on a CT scan of his chest.  We also question the possibility of a hypersensitivity reaction, chronic aspiration or foreign body.  On over read of his pathology there was no evidence of foreign material or lipoid pneumonia.  Finally there have been case reports of the COVID-vaccine causing granulomatous lung disease.  He has a history of first-degree AV block and was planned to see cardiology given the new diagnosis of possible sarcoidosis. We repeated his CT scan of the chest 01/30/2023 which showed stable patchy infiltrates but surprising evidence for right lower lobe subsegmental PE.  This prompted us  to perform a CT-PA 02/03/2023 that confirmed bilateral upper and lower lobe subsegmental  pulmonary emboli.  He is asymptomatic, is very active. No LE edema, dyspnea.    Review of Systems As per HPI  Past Medical History:  Diagnosis Date   Arthritis    right knee   BPH (benign prostatic hypertrophy)    GERD (gastroesophageal reflux disease)    H/O hiatal hernia    History of colon polyps    History of kidney stones    surgery to remove   Hyperlipidemia    Hypertension    Knee internal derangement    RIGHT   Macular pucker, right eye 07/05/2019   Vitrectomy, removal of silicone oil, membrane peel, repair complex retinal detachment inferonasal right eye  11-30-19   Mild asthma    Nuclear sclerotic cataract of left eye 03/26/2021   Discussion with patient and family that medical reasons that cataract surgery should be undertaken so as to allow for ultimately complete removal of the anterior hyaloid at time of vitrectomy   Personal history of COVID-19 08/2020   Right inguinal hernia    Vitamin D  deficiency    Wears glasses      Family History  Problem Relation Age of Onset   Hyperlipidemia Mother    Coronary artery disease Mother    Pulmonary embolism Father    Colon cancer Father    Hyperlipidemia Brother    Pancreatic cancer Brother      Social History   Socioeconomic History   Marital status: Married    Spouse name: jan   Number of children: 2   Years of education: Not on file   Highest education level: Not on  file  Occupational History   Occupation: physician  Tobacco Use   Smoking status: Never    Passive exposure: Never   Smokeless tobacco: Never  Vaping Use   Vaping status: Never Used  Substance and Sexual Activity   Alcohol use: Yes    Alcohol/week: 0.0 standard drinks of alcohol    Comment: social use   Drug use: No   Sexual activity: Yes    Partners: Female  Other Topics Concern   Not on file  Social History Narrative   Not on file   Social Drivers of Health   Financial Resource Strain: Not on file  Food Insecurity: Not on  file  Transportation Needs: Not on file  Physical Activity: Not on file  Stress: Not on file  Social Connections: Not on file  Intimate Partner Violence: Not on file    [place], [place], [place]   Allergies  Allergen Reactions   Merthiolate [Thimerosal (Thiomersal)] Rash    Rash--local     Outpatient Medications Prior to Visit  Medication Sig Dispense Refill   allopurinol  (ZYLOPRIM ) 300 MG tablet Take 1 tablet (300 mg total) by mouth daily. 90 tablet 1   aspirin  EC 81 MG tablet Take 1 tablet (81 mg total) by mouth daily. Swallow whole.     atorvastatin  (LIPITOR) 80 MG tablet Take 1 tablet (80 mg total) by mouth every morning. 90 tablet 3   budesonide -formoterol  (SYMBICORT ) 160-4.5 MCG/ACT inhaler Inhale 2 puffs into the lungs 2 (two) times daily. 1 each 6   ezetimibe  (ZETIA ) 10 MG tablet Take 1 tablet (10 mg total) by mouth daily. 90 tablet 3   fluorouracil  (EFUDEX ) 5 % cream Apply to face, scalp and tops of ears bid for 2 wks 40 g 2   fluticasone  (FLONASE ) 50 MCG/ACT nasal spray Place 2 sprays into both nostrils daily as needed for allergies or rhinitis. 16 g 2   ibuprofen (ADVIL) 600 MG tablet Take 600 mg by mouth every 6 (six) hours as needed.     levalbuterol  (XOPENEX ) 0.63 MG/3ML nebulizer solution Take 3 mLs (0.63 mg total) by nebulization every 6 (six) hours as needed for wheezing or shortness of breath. 90 mL 5   mirabegron  ER (MYRBETRIQ ) 50 MG TB24 tablet Take 1 tablet (50 mg total) by mouth daily. 90 tablet 3   nebivolol  (BYSTOLIC ) 10 MG tablet Take 1 tablet (10 mg total) by mouth at bedtime. 90 tablet 1   olmesartan -hydrochlorothiazide (BENICAR  HCT) 40-12.5 MG tablet Take 1 tablet by mouth daily. 90 tablet 1   omeprazole  (PRILOSEC) 40 MG capsule Take 1 capsule (40 mg total) by mouth daily. 90 capsule 1   terbinafine  (LAMISIL ) 250 MG tablet Take 1 tablet (250 mg total) by mouth every other day. 45 tablet 0   VITAMIN D  PO Take 2 capsules by mouth daily.     No facility-administered  medications prior to visit.        Objective:   Physical Exam Vitals:   02/12/23 1434  BP: 130/78  Pulse: (!) 57  Temp: 98 F (36.7 C)  TempSrc: Oral  SpO2: 97%  Weight: 205 lb (93 kg)  Height: 6' 2 (1.88 m)     Gen: Pleasant, well-nourished, in no distress,  normal affect  ENT: No lesions,  mouth clear,  oropharynx clear, no postnasal drip  Neck: No JVD, no stridor  Lungs: No use of accessory muscles, no crackles or wheezing on normal respiration, no wheeze on forced expiration  Cardiovascular: RRR, heart sounds  normal, no murmur or gallops, no peripheral edema  Musculoskeletal: No deformities, no cyanosis or clubbing  Neuro: alert, awake, non focal  Skin: Warm, no lesions or rash      Assessment & Plan:  Pulmonary embolism (HCC) Bilateral upper and lower lobe subsegmental pulmonary emboli.  Reviewed the literature with Dr. Leandro regarding anticoagulation in this setting.  He is completely asymptomatic.  There is argument in the literature, although there is some data that risk of recurrence of pulmonary embolism may be slightly increased in this setting compared with unilateral PE.  We have decided to hold off on anticoagulation for now.  If he changes his mind then we will initiate DOAC and plan for 3 months.  Certainly if there is symptomatic change we will treat.  We will plan to obtain repeat lower extremity Dopplers to ensure no evolving clot, extension of clot that would impact this decision in late January.  He will also need hypercoagulable lab workup, would probably wait until about 3 months after the initial diagnosis.  Finally, we will decide whether there is any utility in VQ scan or echocardiogram.  He will have repeat CT scanning of the chest to follow his pulmonary infiltrates and we can make the initial scan a CT-PA to also visualize the pulmonary vasculature.  Granulomatous lung disease (HCC) Presumed that this is sarcoidosis, all cultures negative  and HSP panel negative.  Plan for repeat imaging, probably in about 6 months unless our plan is impacted by his diagnosis of PE, prompts us  to perform imaging sooner.  He is going to ensure that his ophthalmologist, cardiologist know about the diagnosis.     Lamar Chris, MD, PhD 02/12/2023, 5:06 PM [facility] [phone] or if no answer before 7:00PM call [phone] For any issues after 7:00PM please call eLink [phone]

## 2023-02-12 NOTE — Assessment & Plan Note (Addendum)
 Presumed that this is sarcoidosis, all cultures negative and HSP panel negative.  Plan for repeat imaging, probably in about 6 months unless our plan is impacted by his diagnosis of PE, prompts us  to perform imaging sooner.  He is going to ensure that his ophthalmologist, cardiologist know about the diagnosis.

## 2023-02-12 NOTE — Assessment & Plan Note (Signed)
 Bilateral upper and lower lobe subsegmental pulmonary emboli.  Reviewed the literature with Dr. Leandro regarding anticoagulation in this setting.  He is completely asymptomatic.  There is argument in the literature, although there is some data that risk of recurrence of pulmonary embolism may be slightly increased in this setting compared with unilateral PE.  We have decided to hold off on anticoagulation for now.  If he changes his mind then we will initiate DOAC and plan for 3 months.  Certainly if there is symptomatic change we will treat.  We will plan to obtain repeat lower extremity Dopplers to ensure no evolving clot, extension of clot that would impact this decision in late January.  He will also need hypercoagulable lab workup, would probably wait until about 3 months after the initial diagnosis.  Finally, we will decide whether there is any utility in VQ scan or echocardiogram.  He will have repeat CT scanning of the chest to follow his pulmonary infiltrates and we can make the initial scan a CT-PA to also visualize the pulmonary vasculature.

## 2023-02-13 DIAGNOSIS — Z5189 Encounter for other specified aftercare: Secondary | ICD-10-CM | POA: Diagnosis not present

## 2023-02-26 MED ORDER — BUDESONIDE-FORMOTEROL FUMARATE 160-4.5 MCG/ACT IN AERO: 2.0000 | INHALATION_SPRAY | Freq: Every day | RESPIRATORY_TRACT | 1 refills | Status: AC

## 2023-02-26 NOTE — Addendum Note (Signed)
Addended by: Shelby Dubin on: 02/26/2023 10:40 AM   Modules accepted: Orders

## 2023-03-05 ENCOUNTER — Encounter (HOSPITAL_COMMUNITY): Payer: Federal, State, Local not specified - PPO

## 2023-03-05 ENCOUNTER — Ambulatory Visit (HOSPITAL_BASED_OUTPATIENT_CLINIC_OR_DEPARTMENT_OTHER): Payer: Federal, State, Local not specified - PPO

## 2023-03-05 DIAGNOSIS — I2694 Multiple subsegmental pulmonary emboli without acute cor pulmonale: Secondary | ICD-10-CM | POA: Diagnosis not present

## 2023-03-06 ENCOUNTER — Other Ambulatory Visit: Payer: Self-pay | Admitting: Emergency Medicine

## 2023-03-06 DIAGNOSIS — I2694 Multiple subsegmental pulmonary emboli without acute cor pulmonale: Secondary | ICD-10-CM

## 2023-03-06 NOTE — Progress Notes (Signed)
Discussed Doppler lower extremities with the patient by phone.  There is a suspected chronic clot in the calf.  Decision regarding anticoagulation complicated.  I will check hyper coag panel to see if this helps [place] decide whether to initiate DOAC.

## 2023-03-20 ENCOUNTER — Other Ambulatory Visit: Payer: Federal, State, Local not specified - PPO

## 2023-03-20 DIAGNOSIS — I2694 Multiple subsegmental pulmonary emboli without acute cor pulmonale: Secondary | ICD-10-CM | POA: Diagnosis not present

## 2023-03-22 LAB — LUPUS ANTICOAGULANT
Dilute Viper Venom Time: 34 s (ref 0.0–47.0)
PTT Lupus Anticoagulant: 29.4 s (ref 0.0–43.5)
Thrombin Time: 19.1 s (ref 0.0–23.0)
dPT Confirm Ratio: 0.96 {ratio} (ref 0.00–1.34)
dPT: 37.4 s (ref 0.0–47.6)

## 2023-03-22 LAB — SPECIMEN STATUS REPORT

## 2023-03-22 LAB — PROTEIN C ACTIVITY: Protein C Activity: 125 % (ref 73–180)

## 2023-03-24 LAB — BETA-2 GLYCOPROTEIN ANTIBODIES
Beta-2 Glyco 1 IgA: <2 U/mL (ref <20.0)
Beta-2 Glyco 1 IgM: 2.1 U/mL (ref <20.0)
Beta-2 Glyco I IgG: <2 U/mL (ref <20.0)

## 2023-03-26 LAB — LUPUS ANTICOAG/CARDIOLIPIN AB

## 2023-03-27 ENCOUNTER — Encounter: Payer: Self-pay | Admitting: Emergency Medicine

## 2023-03-27 DIAGNOSIS — I2694 Multiple subsegmental pulmonary emboli without acute cor pulmonale: Secondary | ICD-10-CM

## 2023-03-27 LAB — PROTHROMBIN GENE MUTATION

## 2023-03-27 LAB — FACTOR V LEIDEN (PCR W/GEL ELECTROPHORESIS)

## 2023-03-27 LAB — PLASMINOGEN ACTIVATOR INHIBITOR 1(PAI-1) 4G/5G POLYMORPHISM

## 2023-03-27 LAB — PROTEIN S ACTIVITY

## 2023-03-27 LAB — ANTITHROMBIN III ANTIGEN

## 2023-03-27 LAB — HOMOCYSTEINE

## 2023-03-27 LAB — LUPUS ANTICOAG/CARDIOLIPIN AB

## 2023-03-27 NOTE — Telephone Encounter (Signed)
Please advise on pt email:  [name], Jason Krabbe, MD  [facility] Phone Number: [phone]   Cleone Slim, saw results of recent bloodwork.  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 Unfortunately, the lab either submitted the wrong tube or no sample for homocysteine, AT3, protein S.  I would like to get these done at your office this go around. Also, the anticardiolipin AB test was not done either. They didn't submit a frozen sample?  I took the liberty of looking up the test # on [facility]'s site. It is  #[id number], and does not require freezing. Double check for me, but can you add to the re-draw for the above tests as well?  Just let me know when entered, and I will go by and have the bloodwork done. Thanks.  [name]

## 2023-04-07 DIAGNOSIS — H35351 Cystoid macular degeneration, right eye: Secondary | ICD-10-CM | POA: Diagnosis not present

## 2023-04-07 DIAGNOSIS — H532 Diplopia: Secondary | ICD-10-CM | POA: Diagnosis not present

## 2023-04-07 DIAGNOSIS — H33022 Retinal detachment with multiple breaks, left eye: Secondary | ICD-10-CM | POA: Diagnosis not present

## 2023-04-16 ENCOUNTER — Encounter: Payer: Self-pay | Admitting: *Deleted

## 2023-04-20 ENCOUNTER — Other Ambulatory Visit

## 2023-04-20 ENCOUNTER — Other Ambulatory Visit: Payer: Self-pay | Admitting: Emergency Medicine

## 2023-04-20 DIAGNOSIS — I2694 Multiple subsegmental pulmonary emboli without acute cor pulmonale: Secondary | ICD-10-CM

## 2023-04-20 DIAGNOSIS — J8410 Pulmonary fibrosis, unspecified: Secondary | ICD-10-CM

## 2023-04-29 ENCOUNTER — Other Ambulatory Visit

## 2023-04-29 DIAGNOSIS — I2694 Multiple subsegmental pulmonary emboli without acute cor pulmonale: Secondary | ICD-10-CM | POA: Diagnosis not present

## 2023-04-29 DIAGNOSIS — J8410 Pulmonary fibrosis, unspecified: Secondary | ICD-10-CM

## 2023-05-01 LAB — ANTITHROMBIN III: AntiThromb III Func: 132 % (ref 75–135)

## 2023-05-01 LAB — CARDIOLIPIN ANTIBODIES, IGG, IGM, IGA
Anticardiolipin IgA: <9 U/mL (ref 0–11)
Anticardiolipin IgG: <9 GPL U/mL (ref 0–14)
Anticardiolipin IgM: <9 [MPL'U]/mL (ref 0–12)

## 2023-05-01 LAB — PROTEIN S, ANTIGEN, FREE: Protein S Ag, Free: 121 % (ref 61–136)

## 2023-05-07 ENCOUNTER — Ambulatory Visit: Payer: Self-pay | Admitting: Urology

## 2023-05-13 ENCOUNTER — Ambulatory Visit: Admitting: Urology

## 2023-05-13 ENCOUNTER — Other Ambulatory Visit: Payer: Self-pay | Admitting: Urology

## 2023-05-13 DIAGNOSIS — Z8546 Personal history of malignant neoplasm of prostate: Secondary | ICD-10-CM

## 2023-05-13 DIAGNOSIS — N138 Other obstructive and reflux uropathy: Secondary | ICD-10-CM

## 2023-05-13 DIAGNOSIS — R399 Unspecified symptoms and signs involving the genitourinary system: Secondary | ICD-10-CM | POA: Diagnosis not present

## 2023-05-13 DIAGNOSIS — N3941 Urge incontinence: Secondary | ICD-10-CM | POA: Diagnosis not present

## 2023-05-13 DIAGNOSIS — N401 Enlarged prostate with lower urinary tract symptoms: Secondary | ICD-10-CM

## 2023-05-13 DIAGNOSIS — C61 Malignant neoplasm of prostate: Secondary | ICD-10-CM

## 2023-05-13 LAB — BLADDER SCAN AMB NON-IMAGING: Scan Result: 20

## 2023-05-13 MED ORDER — MIRABEGRON ER 50 MG PO TB24: 50.0000 mg | ORAL_TABLET | Freq: Every day | ORAL | 3 refills | Status: DC

## 2023-05-13 NOTE — Progress Notes (Signed)
   05/13/2023 1:21 PM   Barbaraann Share, MD Aug 31, 1960 [id number]  Reason for visit: Follow up HoLEP, urinary symptoms, low risk prostate cancer, LUTS  HPI: 63 year old male followed by Dr. Retta Diones long-term for BPH and very low risk prostate cancer with single core of low risk disease and stable PSA(~7), benign prostate MRI, underwent HOLEP with me on 05/30/2022 for obstructive urinary symptoms. 62g of benign tissue removed.  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  He is taking Myrbetriq 50 mg every other day which she does feel helps with urgency and frequency, overall symptoms improved significantly from prior surgery.  Has some mild postvoid dribbling that is minimally bothersome.  PVR today normal at 20 mL.  Post-op PSA normal at 1.1 postop, stable at 1.2 from August 2024.  With his history of low risk low-volume prostate cancer, will continue yearly PSA surveillance.  -PSA in 6 months, call with results -Can continue Myrbetriq 50 mg every other day -RTC yearly PVR, monitoring low risk prostate cancer  Sondra Come, MD  [facility] [address] [place], [place] [address] [phone]

## 2023-06-29 ENCOUNTER — Other Ambulatory Visit: Payer: Self-pay | Admitting: Family Medicine

## 2023-06-29 DIAGNOSIS — K219 Gastro-esophageal reflux disease without esophagitis: Secondary | ICD-10-CM

## 2023-06-29 DIAGNOSIS — I10 Essential (primary) hypertension: Secondary | ICD-10-CM

## 2023-07-08 ENCOUNTER — Encounter: Payer: Self-pay | Admitting: Dermatology

## 2023-07-08 ENCOUNTER — Ambulatory Visit: Payer: Federal, State, Local not specified - PPO | Admitting: Dermatology

## 2023-07-08 VITALS — BP 122/81 | HR 60

## 2023-07-08 DIAGNOSIS — B351 Tinea unguium: Secondary | ICD-10-CM

## 2023-07-08 MED ORDER — JUBLIA 10 % EX SOLN: 1.0000 | Freq: Every day | CUTANEOUS | 11 refills | Status: DC

## 2023-07-08 NOTE — Progress Notes (Signed)
   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 Patient was last evaluated on 01/07/23. At this visit patient was prescribed Terbinafine . Patient reports sxs are Improving. Patient denies medication changes.  The following portions of the chart were reviewed this encounter and updated as appropriate: medications, allergies, medical history  Review of Systems:  No other skin or systemic complaints except as noted in HPI or Assessment and Plan.  Objective  Well appearing patient in no apparent distress; mood and affect are within normal limits.  A focused examination was performed of the following areas: Toenails  Relevant exam findings are noted in the Assessment and Plan.              Assessment & Plan   ONYCHOMYCOSIS Exam: Thickened toenails with subungal debris c/w onychomycosis  Chronic and persistent condition with duration or expected duration over one year. Condition is symptomatic/ bothersome to patient. Not currently at goal.  Treatment Plan: - Recommended taking daily collagen supplement to increase nail growth - Prescribed Jublia to apply topical daily until toenails completely grow out, once clear decrease 1-2 nights weekly - Plan to follow up in   ONYCHOMYCOSIS   Related Medications Efinaconazole (JUBLIA) 10 % SOLN Apply 1 Application topically daily.  Return in about 3 months (around 10/08/2023) for TBSE with Dr. Paci.  I, Jetta Ager, am acting as Neurosurgeon for [employer], DO.  Documentation: I have reviewed the above documentation for accuracy and completeness, and I agree with the above.  Louana Roup, DO

## 2023-07-08 NOTE — Patient Instructions (Addendum)
 Hello Dr. Bennetta Braun,  Diagnosis: Onychomycosis    - Plan:    Discontinue oral terbinafine       Start Jublia (efinaconazole) topical solution:     - Apply thin layer to all toenails daily, preferably at bedtime     - Continue until full nail growth, then reduce to 2-3 times weekly for prevention     - Patient counseled on proper application technique:       - Apply Vaseline around the toe for protection before Jublia application     - Patient to check expiration date on current Jublia supply; can use if not expired     - New prescription for Jublia to be sent to pharmacy       - Patient informed about potential insurance coverage and copay options     Recommend daily collagen supplement (e.g., [employer] brand) to support nail health     Recommend Aquaphor spray after Jublia application for skin moisturization  Follow up as needed or if concerns arise.            Important Information   Due to recent changes in healthcare laws, you may see results of your pathology and/or laboratory studies on MyChart before the doctors have had a chance to review them. We understand that in some cases there may be results that are confusing or concerning to you. Please understand that not all results are received at the same time and often the doctors may need to interpret multiple results in order to provide you with the best plan of care or course of treatment. Therefore, we ask that you please give us  2 business days to thoroughly review all your results before contacting the office for clarification. Should we see a critical lab result, you will be contacted sooner.     If You Need Anything After Your Visit   If you have any questions or concerns for your doctor, please call our main line at [phone]. If no one answers, please leave a voicemail as directed and we will return your call as soon as possible. Messages left after 4 pm will be answered the following business day.    You may also  send us  a message via MyChart. We typically respond to MyChart messages within 1-2 business days.  For prescription refills, please ask your pharmacy to contact our office. Our fax number is [phone].  If you have an urgent issue when the clinic is closed that cannot wait until the next business day, you can page your doctor at the number below.     Please note that while we do our best to be available for urgent issues outside of office hours, we are not available 24/7.    If you have an urgent issue and are unable to reach us , you may choose to seek medical care at your doctor's office, retail clinic, urgent care center, or emergency room.   If you have a medical emergency, please immediately call 911 or go to the emergency department. In the event of inclement weather, please call our main line at [phone] for an update on the status of any delays or closures.  Dermatology Medication Tips: Please keep the boxes that topical medications come in in order to help keep track of the instructions about where and how to use these. Pharmacies typically print the medication instructions only on the boxes and not directly on the medication tubes.   If your medication is too expensive, please contact our office  at [phone] or send us  a message through MyChart.    We are unable to tell what your co-pay for medications will be in advance as this is different depending on your insurance coverage. However, we may be able to find a substitute medication at lower cost or fill out paperwork to get insurance to cover a needed medication.    If a prior authorization is required to get your medication covered by your insurance company, please allow us  1-2 business days to complete this process.   Drug prices often vary depending on where the prescription is filled and some pharmacies may offer cheaper prices.   The website www.goodrx.com contains coupons for medications through different  pharmacies. The prices here do not account for what the cost may be with help from insurance (it may be cheaper with your insurance), but the website can give you the price if you did not use any insurance.  - You can print the associated coupon and take it with your prescription to the pharmacy.  - You may also stop by our office during regular business hours and pick up a GoodRx coupon card.  - If you need your prescription sent electronically to a different pharmacy, notify our office through [facility] or by phone at [phone]

## 2023-08-10 ENCOUNTER — Encounter: Payer: Self-pay | Admitting: Emergency Medicine

## 2023-08-11 NOTE — Telephone Encounter (Signed)
**Note De-identified  [place] Obfuscation** Please advise 

## 2023-08-12 ENCOUNTER — Telehealth: Payer: Self-pay | Admitting: Emergency Medicine

## 2023-08-12 DIAGNOSIS — I2694 Multiple subsegmental pulmonary emboli without acute cor pulmonale: Secondary | ICD-10-CM

## 2023-08-12 DIAGNOSIS — J8410 Pulmonary fibrosis, unspecified: Secondary | ICD-10-CM

## 2023-08-12 NOTE — Telephone Encounter (Signed)
 [facility] can you please route this encounter back once this CT gets scheduled so follow-up ov can be made. Thanks

## 2023-08-12 NOTE — Telephone Encounter (Signed)
 I ordered a Ct chest for Dr Corrie. Need to set up an OV for us  to review, thanks.

## 2023-08-13 NOTE — Telephone Encounter (Signed)
 This was scheduled for 08/19/23

## 2023-08-14 NOTE — Telephone Encounter (Signed)
 LM for PT to call to sched. Will send [facility] as well.

## 2023-08-14 NOTE — Telephone Encounter (Signed)
 Atc x1. Ldvmtcb  If patient calls back please schedule patient 8/6 at 1:30 with Dr. Shelah to review CT

## 2023-08-14 NOTE — Telephone Encounter (Signed)
Yes ok to use blocked slot

## 2023-08-14 NOTE — Telephone Encounter (Signed)
 Copied from CRM [id number]. Topic: Appointments - Scheduling Inquiry for Clinic >> Aug 14, 2023  9:25 AM [name] wrote: Reason for CRM: Pt is requesting a follow up appt post CT on 7/22. I attempted to schedule an appt in Sept with Dr. Shelah, but pt insisted that was way too long of a wait. Pt would like to be scheduled late July early August if possible and prefers to see Dr. Shelah. Please call the pt back at [phone] ok to leave a vm. >> Aug 14, 2023  9:45 AM Thersia RAMAN wrote: Nothing available until September, can I add patient to blocked slot? Please advise, thanks!  Dr. Shelah your next avail is 9/12. There is blocked spot 8/6. Would you like to use block spot?

## 2023-08-17 NOTE — Telephone Encounter (Signed)
 ATC patient again, no answer so I left a vm.

## 2023-08-17 NOTE — Telephone Encounter (Signed)
 Copied from CRM [id number]. Topic: Appointments - Scheduling Inquiry for Clinic >> Aug 14, 2023  9:25 AM Celestine FALCON wrote: Reason for CRM: Pt is requesting a follow up appt post CT on 7/22. I attempted to schedule an appt in Sept with Dr. Shelah, but pt insisted that was way too long of a wait. Pt would like to be scheduled late July early August if possible and prefers to see Dr. Shelah. Please call the pt back at [phone] ok to leave a vm. >> Aug 14, 2023  4:05 PM Leila BROCKS wrote: Patient [phone] is returning Noel's call, patient would like the appointment with Dr. Shelah for 09/09/23 at 1:30 pm. Please call back.  >> Aug 14, 2023  9:45 AM Thersia RAMAN wrote: Nothing available until September, can I add patient to blocked slot? Please advise, thanks!  Dr Shelah, are you okay to double book on a day you are not already?

## 2023-08-18 NOTE — Telephone Encounter (Signed)
 Received incoming call from [place] regarding scheduling. Pt has been scheduled 8/7 at 3:00pm to review CT.

## 2023-08-19 ENCOUNTER — Other Ambulatory Visit

## 2023-08-25 ENCOUNTER — Ambulatory Visit
Admission: RE | Admit: 2023-08-25 | Discharge: 2023-08-25 | Disposition: A | Discharge status: Home / Self Care | Source: Ambulatory Visit | Attending: Emergency Medicine | Admitting: Emergency Medicine

## 2023-08-25 DIAGNOSIS — J8410 Pulmonary fibrosis, unspecified: Secondary | ICD-10-CM

## 2023-08-25 DIAGNOSIS — I2694 Multiple subsegmental pulmonary emboli without acute cor pulmonale: Secondary | ICD-10-CM

## 2023-08-25 MED ORDER — IOPAMIDOL (ISOVUE-370) INJECTION 76%
80.0000 mL | Freq: Once | INTRAVENOUS | Status: AC | PRN
  Administered 2023-08-25: 80 mL via INTRAVENOUS

## 2023-08-26 ENCOUNTER — Ambulatory Visit: Payer: Self-pay | Admitting: Emergency Medicine

## 2023-09-10 ENCOUNTER — Ambulatory Visit (INDEPENDENT_AMBULATORY_CARE_PROVIDER_SITE_OTHER): Admitting: Emergency Medicine

## 2023-09-10 ENCOUNTER — Encounter: Payer: Self-pay | Admitting: Emergency Medicine

## 2023-09-10 VITALS — BP 116/73 | HR 73 | Temp 97.8°F | Ht 74.0 in | Wt 208.2 lb

## 2023-09-10 DIAGNOSIS — J4520 Mild intermittent asthma, uncomplicated: Secondary | ICD-10-CM | POA: Diagnosis not present

## 2023-09-10 DIAGNOSIS — J8410 Pulmonary fibrosis, unspecified: Secondary | ICD-10-CM

## 2023-09-10 NOTE — Progress Notes (Signed)
 Subjective:    Patient ID: Jason CHRISTELLA [name], MD, male    DOB: 03/26/1960, 63 y.o.   MRN: [id number]  HPI  ROV 09/10/2023 --follow-up visit with [name] who is a 63 year old gentleman, never smoker with a history of mild intermittent asthma.  We have evaluated him for nodular infiltrates on CT chest ultimately ascribed to probable sarcoidosis.  Also consider possible hypersensitivity reaction or chronic foreign body aspiration although no clear evidence to support.  Cardiology is evaluating him for first-degree heart block to make sure there is not a correlation with sarcoidosis - he might end up getting a cardiac PET.  On serial imaging he has had stable infiltrates but found a new right lower lobe subsegmental PE.  We ultimately decided to defer anticoagulation. He goes to the gym, does get winded with heavier activity.   CT-PA 08/25/2023 reviewed by me showed no pericardial effusion, no evidence of acute PE.  The bilateral upper and lower lobe pulmonary emboli have essentially resolved with a trace amount of fibrinous clot in the segmental branches of the right upper lobe.  Small mediastinal and hilar lymph nodes.  Irregular ill-defined nodularity bilaterally that is upper lobe predominant with the clustered configuration.  Overall no significant change.   Review of Systems As per HPI  Past Medical History:  Diagnosis Date   Arthritis    right knee   BPH (benign prostatic hypertrophy)    GERD (gastroesophageal reflux disease)    H/O hiatal hernia    History of colon polyps    History of kidney stones    surgery to remove   Hyperlipidemia    Hypertension    Knee internal derangement    RIGHT   Macular pucker, right eye 07/05/2019   Vitrectomy, removal of silicone oil, membrane peel, repair complex retinal detachment inferonasal right eye  11-30-19   Mild asthma    Nuclear sclerotic cataract of left eye 03/26/2021   Discussion with patient and family that medical reasons that  cataract surgery should be undertaken so as to allow for ultimately complete removal of the anterior hyaloid at time of vitrectomy   Personal history of COVID-19 08/2020   Right inguinal hernia    Vitamin D  deficiency    Wears glasses      Family History  Problem Relation Age of Onset   Hyperlipidemia Mother    Coronary artery disease Mother    Pulmonary embolism Father    Colon cancer Father    Hyperlipidemia Brother    Pancreatic cancer Brother      Social History   Socioeconomic History   Marital status: Married    Spouse name: jan   Number of children: 2   Years of education: Not on file   Highest education level: Not on file  Occupational History   Occupation: physician  Tobacco Use   Smoking status: Never    Passive exposure: Never   Smokeless tobacco: Never  Vaping Use   Vaping status: Never Used  Substance and Sexual Activity   Alcohol use: Yes    Alcohol/week: 0.0 standard drinks of alcohol    Comment: social use   Drug use: No   Sexual activity: Yes    Partners: Female  Other Topics Concern   Not on file  Social History Narrative   Not on file   Social Drivers of Health   Financial Resource Strain: Not on file  Food Insecurity: Not on file  Transportation Needs: Not on file  Physical  Activity: Not on file  Stress: Not on file  Social Connections: Not on file  Intimate Partner Violence: Not on file    [place], [place], [place]   Allergies  Allergen Reactions   Merthiolate [Thimerosal (Thiomersal)] Rash    Rash--local     Outpatient Medications Prior to Visit  Medication Sig Dispense Refill   allopurinol  (ZYLOPRIM ) 300 MG tablet Take 1 tablet (300 mg total) by mouth daily. 90 tablet 1   aspirin  EC 81 MG tablet Take 1 tablet (81 mg total) by mouth daily. Swallow whole.     atorvastatin  (LIPITOR) 80 MG tablet Take 1 tablet (80 mg total) by mouth every morning. 90 tablet 3   budesonide -formoterol  (SYMBICORT ) 160-4.5 MCG/ACT inhaler Inhale 2 puffs into the  lungs daily. 18 g 1   Efinaconazole  (JUBLIA ) 10 % SOLN Apply 1 Application topically daily. 4 mL 11   ezetimibe  (ZETIA ) 10 MG tablet Take 1 tablet (10 mg total) by mouth daily. 90 tablet 3   fluorouracil  (EFUDEX ) 5 % cream Apply to face, scalp and tops of ears bid for 2 wks 40 g 2   fluticasone  (FLONASE ) 50 MCG/ACT nasal spray Place 2 sprays into both nostrils daily as needed for allergies or rhinitis. 16 g 2   ibuprofen (ADVIL) 600 MG tablet Take 600 mg by mouth every 6 (six) hours as needed.     levalbuterol  (XOPENEX ) 0.63 MG/3ML nebulizer solution Take 3 mLs (0.63 mg total) by nebulization every 6 (six) hours as needed for wheezing or shortness of breath. 90 mL 5   nebivolol  (BYSTOLIC ) 10 MG tablet Take 1 tablet (10 mg total) by mouth at bedtime. 90 tablet 1   olmesartan -hydrochlorothiazide (BENICAR  HCT) 40-12.5 MG tablet TAKE 1 TABLET DAILY 90 tablet 1   omeprazole  (PRILOSEC) 40 MG capsule TAKE 1 CAPSULE DAILY 90 capsule 1   VITAMIN D  PO Take 2 capsules by mouth daily.     mirabegron  ER (MYRBETRIQ ) 50 MG TB24 tablet Take 1 tablet by mouth daily (Patient not taking: Reported on 09/10/2023) 90 tablet 3   No facility-administered medications prior to visit.        Objective:   Physical Exam Vitals:   09/10/23 1501  BP: 116/73  Pulse: 73  Temp: 97.8 F (36.6 C)  TempSrc: Temporal  SpO2: 97%  Weight: 208 lb 3.2 oz (94.4 kg)  Height: 6' 2 (1.88 m)    Gen: Pleasant, well-nourished, in no distress,  normal affect  ENT: No lesions,  mouth clear,  oropharynx clear, no postnasal drip  Neck: No JVD, no stridor  Lungs: No use of accessory muscles, no crackles or wheezing on normal respiration, no wheeze on forced expiration  Cardiovascular: RRR, heart sounds normal, no murmur or gallops, no peripheral edema  Musculoskeletal: No deformities, no cyanosis or clubbing  Neuro: alert, awake, non focal  Skin: Warm, no lesions or rash      Assessment & Plan:  Granulomatous lung  disease (HCC) Reviewed his CT scan with him today.  Overall the infiltrates persist, are unchanged.  Unclear whether this is smoldering active disease, quiescent disease or scar.  We talked about the pros and cons of treating empirically with a course of prednisone  and then scanning for resolution versus watchful waiting with a repeat CT chest in 6 months.  He is going to think about this and then we will discuss, decide which avenue to pursue.  We will plan to repeat his pulmonary function testing at the 1 year mark which would  be October  Mild reactive airways disease Overall stable with a very good functional capacity.  He does get winded with very heavy exertion, heavy workouts.  No flares.  Plan to continue same regimen.      Lamar Chris, MD, PhD 09/10/2023, 4:44 PM [facility] [phone] or if no answer before 7:00PM call [phone] For any issues after 7:00PM please call eLink [phone]

## 2023-09-10 NOTE — Assessment & Plan Note (Addendum)
 Reviewed his CT scan with him today.  Overall the infiltrates persist, are unchanged.  Unclear whether this is smoldering active disease, quiescent disease or scar.  We talked about the pros and cons of treating empirically with a course of prednisone  and then scanning for resolution versus watchful waiting with a repeat CT chest in 6 months.  He is going to think about this and then we will discuss, decide which avenue to pursue.  We will plan to repeat his pulmonary function testing at the 1 year mark which would be October

## 2023-09-10 NOTE — Assessment & Plan Note (Signed)
 Overall stable with a very good functional capacity.  He does get winded with very heavy exertion, heavy workouts.  No flares.  Plan to continue same regimen.

## 2023-09-10 NOTE — Patient Instructions (Addendum)
 We reviewed your CT scan of the chest today. We will arrange for pulmonary function testing in October 2025 We discussed possibly treating with empiric prednisone  versus repeating her CT scan of the chest in 6 months (January).  Message Dr. Shelah when you decide which strategy you want to pursue We will plan your follow-up visit once we decide next steps.

## 2023-09-24 ENCOUNTER — Other Ambulatory Visit: Payer: Self-pay | Admitting: Family Medicine

## 2023-09-24 ENCOUNTER — Encounter: Payer: Self-pay | Admitting: Family Medicine

## 2023-09-24 DIAGNOSIS — I10 Essential (primary) hypertension: Secondary | ICD-10-CM

## 2023-09-24 DIAGNOSIS — M1A0790 Idiopathic chronic gout, unspecified ankle and foot, without tophus (tophi): Secondary | ICD-10-CM

## 2023-09-25 ENCOUNTER — Other Ambulatory Visit: Payer: Self-pay | Admitting: Family Medicine

## 2023-09-25 MED ORDER — MOMETASONE FUROATE 50 MCG/ACT NA SUSP
2.0000 | Freq: Every day | NASAL | 12 refills | Status: DC
Start: 2023-09-25 — End: 2023-09-28

## 2023-09-25 NOTE — Telephone Encounter (Signed)
 Med not med list. Please advise  Pt needs follow up appointment please

## 2023-09-28 ENCOUNTER — Encounter: Payer: Self-pay | Admitting: Family Medicine

## 2023-09-28 ENCOUNTER — Ambulatory Visit (INDEPENDENT_AMBULATORY_CARE_PROVIDER_SITE_OTHER): Admitting: Family Medicine

## 2023-09-28 VITALS — BP 108/70 | HR 58 | Temp 98.3°F | Resp 18 | Ht 74.0 in | Wt 208.0 lb

## 2023-09-28 DIAGNOSIS — I10 Essential (primary) hypertension: Secondary | ICD-10-CM

## 2023-09-28 DIAGNOSIS — M1A0790 Idiopathic chronic gout, unspecified ankle and foot, without tophus (tophi): Secondary | ICD-10-CM

## 2023-09-28 DIAGNOSIS — Z0000 Encounter for general adult medical examination without abnormal findings: Secondary | ICD-10-CM | POA: Diagnosis not present

## 2023-09-28 DIAGNOSIS — K219 Gastro-esophageal reflux disease without esophagitis: Secondary | ICD-10-CM | POA: Diagnosis not present

## 2023-09-28 DIAGNOSIS — E785 Hyperlipidemia, unspecified: Secondary | ICD-10-CM

## 2023-09-28 DIAGNOSIS — C61 Malignant neoplasm of prostate: Secondary | ICD-10-CM

## 2023-09-28 DIAGNOSIS — E559 Vitamin D deficiency, unspecified: Secondary | ICD-10-CM

## 2023-09-28 MED ORDER — OLMESARTAN MEDOXOMIL-HCTZ 40-12.5 MG PO TABS
1.0000 | ORAL_TABLET | Freq: Every day | ORAL | 3 refills | Status: AC
Start: 2023-09-28 — End: ?

## 2023-09-28 MED ORDER — MOMETASONE FUROATE 50 MCG/ACT NA SUSP: 2.0000 | Freq: Every day | NASAL | 3 refills | Status: DC

## 2023-09-28 MED ORDER — OMEPRAZOLE 40 MG PO CPDR: 40.0000 mg | DELAYED_RELEASE_CAPSULE | Freq: Every day | ORAL | 3 refills | Status: AC

## 2023-09-28 MED ORDER — ALLOPURINOL 300 MG PO TABS: 300.0000 mg | ORAL_TABLET | Freq: Every day | ORAL | 3 refills | Status: DC

## 2023-09-28 MED ORDER — NEBIVOLOL HCL 10 MG PO TABS
10.0000 mg | ORAL_TABLET | Freq: Every day | ORAL | 3 refills | Status: DC
Start: 2023-09-28 — End: 2023-12-24

## 2023-09-28 NOTE — Progress Notes (Signed)
 Subjective:    Jason CHRISTELLA [name], [name] is a 63 y.o. male who presents for a Welcome to Medicare exam.    Discussed the use of AI scribe software for clinical note transcription with the patient, who gave verbal consent to proceed.  History of Present Illness Jason CHRISTELLA [name], [name] is a 63 year old male who presents for an annual physical exam.  He has a history of double vision, which remains unchanged. He is protective of his left eye, wearing goggles or sunglasses during activities that might pose a risk to his vision. He hopes that his left eye will continue to function well.  He underwent a total knee replacement on his right knee in November of last year. Postoperatively, he experienced asymptomatic pulmonary emboli, which resolved without treatment. He also has a chronic clot in his ankle.  He is currently seeing a urologist in [place] for follow-up after prostate surgery performed a year and a half ago. He continues to have regular follow-ups.  He has a history of high cholesterol and is currently on Lipitor, which was increased to 80 mg, and Zetia . He has not yet completed a repeat cholesterol test that was due in February but plans to do so soon.  He has a history of pulmonary infiltrates, which are being monitored. He has not experienced any symptoms that would suggest a need for treatment at this time.  He mentions a high calcium  score and has a history of first-degree AV block, which is being monitored.  No changes in family history. He has not received the shingles, pneumonia, or tetanus vaccines recently and prefers to wait for a reason to get them.  He spends about two weeks a month at the beach and enjoys activities such as cycling.  No chest pain, shortness of breath, depression, or anxiety. Good memory.       Objective:    Today's Vitals   09/28/23 0910  BP: 108/70  Pulse: (!) 58  Resp: 18  Temp: 98.3 F (36.8 C)  TempSrc: Oral  SpO2: 95%  Weight: 208 lb (94.3  kg)  Height: 6' 2 (1.88 m)   Body mass index is 26.71 kg/m.  Medications Outpatient Encounter Medications as of 09/28/2023  Medication Sig   aspirin  EC 81 MG tablet Take 1 tablet (81 mg total) by mouth daily. Swallow whole.   atorvastatin  (LIPITOR) 80 MG tablet Take 1 tablet (80 mg total) by mouth every morning.   budesonide -formoterol  (SYMBICORT ) 160-4.5 MCG/ACT inhaler Inhale 2 puffs into the lungs daily.   Efinaconazole  (JUBLIA ) 10 % SOLN Apply 1 Application topically daily.   ezetimibe  (ZETIA ) 10 MG tablet Take 1 tablet (10 mg total) by mouth daily.   fluorouracil  (EFUDEX ) 5 % cream Apply to face, scalp and tops of ears bid for 2 wks   fluticasone  (FLONASE ) 50 MCG/ACT nasal spray Place 2 sprays into both nostrils daily as needed for allergies or rhinitis.   ibuprofen (ADVIL) 600 MG tablet Take 600 mg by mouth every 6 (six) hours as needed.   levalbuterol  (XOPENEX ) 0.63 MG/3ML nebulizer solution Take 3 mLs (0.63 mg total) by nebulization every 6 (six) hours as needed for wheezing or shortness of breath.   VITAMIN D  PO Take 2 capsules by mouth daily.   [DISCONTINUED] allopurinol  (ZYLOPRIM ) 300 MG tablet Take 1 tablet (300 mg total) by mouth daily.   [DISCONTINUED] mometasone  (NASONEX ) 50 MCG/ACT nasal spray Place 2 sprays into the nose daily.   [DISCONTINUED] nebivolol  (BYSTOLIC ) 10 MG  tablet Take 1 tablet (10 mg total) by mouth at bedtime.   [DISCONTINUED] olmesartan -hydrochlorothiazide (BENICAR  HCT) 40-12.5 MG tablet TAKE 1 TABLET DAILY   [DISCONTINUED] omeprazole  (PRILOSEC) 40 MG capsule TAKE 1 CAPSULE DAILY   allopurinol  (ZYLOPRIM ) 300 MG tablet Take 1 tablet (300 mg total) by mouth daily.   mirabegron  ER (MYRBETRIQ ) 50 MG TB24 tablet Take 1 tablet by mouth daily (Patient not taking: Reported on 09/28/2023)   mometasone  (NASONEX ) 50 MCG/ACT nasal spray Place 2 sprays into the nose daily.   nebivolol  (BYSTOLIC ) 10 MG tablet Take 1 tablet (10 mg total) by mouth daily.    olmesartan -hydrochlorothiazide (BENICAR  HCT) 40-12.5 MG tablet Take 1 tablet by mouth daily.   omeprazole  (PRILOSEC) 40 MG capsule Take 1 capsule (40 mg total) by mouth daily.   [DISCONTINUED] mometasone  (NASONEX ) 50 MCG/ACT nasal spray Place 2 sprays into the nose daily.   No facility-administered encounter medications on file as of 09/28/2023.     History: Past Medical History:  Diagnosis Date   Arthritis    right knee   BPH (benign prostatic hypertrophy)    GERD (gastroesophageal reflux disease)    H/O hiatal hernia    History of colon polyps    History of kidney stones    surgery to remove   Hyperlipidemia    Hypertension    Knee internal derangement    RIGHT   Macular pucker, right eye 07/05/2019   Vitrectomy, removal of silicone oil, membrane peel, repair complex retinal detachment inferonasal right eye  11-30-19   Mild asthma    Nuclear sclerotic cataract of left eye 03/26/2021   Discussion with patient and family that medical reasons that cataract surgery should be undertaken so as to allow for ultimately complete removal of the anterior hyaloid at time of vitrectomy   Personal history of COVID-19 08/2020   Right inguinal hernia    Vitamin D  deficiency    Wears glasses    Past Surgical History:  Procedure Laterality Date   BRONCHIAL BIOPSY  10/20/2022   Procedure: BRONCHIAL BIOPSIES;  Surgeon: Shelah Lamar RAMAN, [name];  Location: [facility] ENDOSCOPY;  Service: Pulmonary;;   BRONCHIAL BRUSHINGS  10/20/2022   Procedure: BRONCHIAL BRUSHINGS;  Surgeon: Shelah Lamar RAMAN, [name];  Location: [facility] ENDOSCOPY;  Service: Pulmonary;;   BRONCHIAL NEEDLE ASPIRATION BIOPSY  10/20/2022   Procedure: BRONCHIAL NEEDLE ASPIRATION BIOPSIES;  Surgeon: Shelah Lamar RAMAN, [name];  Location: [facility] ENDOSCOPY;  Service: Pulmonary;;   BRONCHIAL WASHINGS  10/20/2022   Procedure: BRONCHIAL WASHINGS;  Surgeon: Shelah Lamar RAMAN, [name];  Location: [facility] ENDOSCOPY;  Service: Pulmonary;;   COLONOSCOPY  09/27/2020   COLONOSCOPY W/  POLYPECTOMY  01/21/2008   ESOPHAGOGASTRODUODENOSCOPY  11/15/2008   EYE SURGERY Bilateral    x6   rankin   HOLEP-LASER ENUCLEATION OF THE PROSTATE WITH MORCELLATION N/A 05/30/2022   Procedure: HOLEP-LASER ENUCLEATION OF THE PROSTATE WITH MORCELLATION;  Surgeon: Francisca Redell BROCKS, [name];  Location: [facility] ORS;  Service: Urology;  Laterality: N/A;   INGUINAL HERNIA REPAIR Right 08/08/2021   Procedure: OPEN RIGHT INGUINAL HERNIA REPAIR WITH MESH;  Surgeon: Eletha Boas, [name];  Location: [facility] ORS;  Service: General;  Laterality: Right;   KNEE ARTHROSCOPY Right X4  last one 2005   KNEE ARTHROSCOPY Right 12/06/2013   Procedure: ARTHROSCOPY RIGHT KNEE WITH DEBRIDMENT AND PARTIAL MEDIAL AND LATERAL MENISCECTOMY AND CHONDRLPLASTY;  Surgeon: Lamar Collet, [name];  Location: [facility];  Service: Orthopedics;  Laterality: Right;   LUMBAR DISC SURGERY  04/02/2007   L5 --  S1   NEGATIVE SLEEP STUDY  per pt   PARATHYROIDECTOMY  02/04/1987   removal adenoma   PROSTATE SURGERY  05/2022   holap   REPLACEMENT TOTAL KNEE Right 12/2022   alusio    Family History  Problem Relation Age of Onset   Hyperlipidemia Mother    Coronary artery disease Mother    Pulmonary embolism Father    Colon cancer Father    Hyperlipidemia Brother    Pancreatic cancer Brother    Social History   Occupational History   Occupation: physician  Tobacco Use   Smoking status: Never    Passive exposure: Never   Smokeless tobacco: Never  Vaping Use   Vaping status: Never Used  Substance and Sexual Activity   Alcohol use: Yes    Alcohol/week: 0.0 standard drinks of alcohol    Comment: social use   Drug use: No   Sexual activity: Yes    Partners: Female    Tobacco Counseling Counseling given: No   Immunizations and Health Maintenance Immunization History  Administered Date(s) Administered   Influenza,inj,Quad PF,6+ Mos 12/06/2018   Moderna Sars-Covid-2 Vaccination 03/04/2019, 04/01/2019   Health Maintenance  Due  Topic Date Due   HIV Screening  Never done    Activities of Daily Living    09/28/2023   10:09 AM  In your present state of health, do you have any difficulty performing the following activities:  Hearing? 0  Vision? 0  Difficulty concentrating or making decisions? 0  Walking or climbing stairs? 0  Dressing or bathing? 0  Doing errands, shopping? 0    Physical Exam   Physical Exam Vitals and nursing note reviewed.  Constitutional:      General: He is not in acute distress.    Appearance: Normal appearance. He is well-developed.  HENT:     Head: Normocephalic and atraumatic.     Right Ear: Tympanic membrane, ear canal and external ear normal. There is no impacted cerumen.     Left Ear: Tympanic membrane, ear canal and external ear normal. There is no impacted cerumen.     Nose: Nose normal.     Mouth/Throat:     Mouth: Mucous membranes are moist.     Pharynx: Oropharynx is clear. No oropharyngeal exudate or posterior oropharyngeal erythema.  Eyes:     General: No scleral icterus.       Right eye: No discharge.        Left eye: No discharge.     Conjunctiva/sclera: Conjunctivae normal.     Pupils: Pupils are equal, round, and reactive to light.  Neck:     Thyroid : No thyromegaly.     Vascular: No JVD.  Cardiovascular:     Rate and Rhythm: Normal rate and regular rhythm.     Heart sounds: Normal heart sounds. No murmur heard. Pulmonary:     Effort: Pulmonary effort is normal. No respiratory distress.     Breath sounds: Normal breath sounds.  Abdominal:     General: Bowel sounds are normal. There is no distension.     Palpations: Abdomen is soft. There is no mass.     Tenderness: There is no abdominal tenderness. There is no guarding or rebound.  Musculoskeletal:        General: Normal range of motion.     Cervical back: Normal range of motion and neck supple.     Right lower leg: No edema.     Left lower leg: No edema.  Lymphadenopathy:  Cervical: No  cervical adenopathy.  Skin:    General: Skin is warm and dry.     Findings: No erythema or rash.  Neurological:     Mental Status: He is alert and oriented to person, place, and time.     Cranial Nerves: No cranial nerve deficit.     Motor: No abnormal muscle tone.     Deep Tendon Reflexes: Reflexes are normal and symmetric. Reflexes normal.  Psychiatric:        Mood and Affect: Mood normal.        Behavior: Behavior normal.        Thought Content: Thought content normal.        Judgment: Judgment normal.    (optional), or other factors deemed appropriate based on the beneficiary's medical and social history and current clinical standards.   Advanced Directives: pt has living will and POA-- not on file     EKG:  normal EKG, normal sinus rhythm, unchanged from previous tracings Done by cardiology    Assessment:    This is a routine wellness  examination for this patient .   Vision/Hearing screen Vision Screening   Right eye Left eye Both eyes  Without correction 20/400 20/20   With correction     Hearing Screening - Comments:: Normal whisper    Goals   None      Depression Screen    09/28/2023    9:16 AM 12/08/2022    9:05 AM 10/17/2021   11:32 AM 07/12/2020    3:11 PM  PHQ 2/9 Scores  PHQ - 2 Score 0 0 0 0     Fall Risk    09/28/2023    9:16 AM  Fall Risk   Falls in the past year? 0  Number falls in past yr: 0  Injury with Fall? 0  Follow up Falls evaluation completed    Cognitive Function    09/28/2023    9:44 AM  MMSE - Mini Mental State Exam  Orientation to time 5  Orientation to Place 5  Registration 3  Attention/ Calculation 5  Recall 3  Language- name 2 objects 2  Language- repeat 1  Language- follow 3 step command 3  Language- read & follow direction 1  Write a sentence 1  Copy design 1  Total score 30        Patient Care Team: Antonio Meth, Jamee SAUNDERS, DO as PCP - General (Family Medicine) Pietro, Redell RAMAN, [name] as PCP - Cardiology  (Cardiology) Rosalie Kitchens, [name] as Consulting Physician (Gastroenterology) Pietro Redell RAMAN, [name] as Consulting Physician (Cardiology) Jarold Mayo, [name] as Consulting Physician (Ophthalmology) Francisca Redell BROCKS, [name] as Consulting Physician (Urology)     Plan:   Preventative   I have personally reviewed and noted the following in the patient's chart:   Medical and social history Use of alcohol, tobacco or illicit drugs  Current medications and supplements including opioid prescriptions. Patient is not currently taking opioid prescriptions. Functional ability and status Nutritional status Physical activity Advanced directives List of other physicians Hospitalizations, surgeries, and ER visits in previous 12 months Vitals Screenings to include cognitive, depression, and falls Referrals and appointments  In addition, I have reviewed and discussed with patient certain preventive protocols, quality metrics, and best practice recommendations. A written personalized care plan for preventive services as well as general preventive health recommendations were provided to patient.   Assessment and Plan Assessment & Plan Adult Wellness Visit   Annual wellness visit conducted  with no changes in family history. Blood pressure is well-controlled. He is on Medicare as of July 1st. No new significant health issues reported. Conduct annual wellness visit and review and update family history.  Double vision with right eye nonfunctional   Persistent double vision with nonfunctional right eye. Protective measures are being taken to preserve the left eye. Continue protective measures for left eye.  Pulmonary infiltrates under surveillance   Pulmonary infiltrates are under surveillance with previous consideration of sarcoidosis as a differential diagnosis. No current symptoms warranting intervention. Continue surveillance of pulmonary infiltrates and consider a short course of steroids if symptoms change or  CT shows significant changes.  History of pulmonary embolism, resolved   Multiple bilateral subsegmental small distal pulmonary emboli post right total knee replacement, resolved without treatment.  Chronic distal lower extremity venous thrombosis   Chronic clot in the distal lower extremity, specifically in the ankle, considered stable and not requiring intervention.  Right total knee replacement, status post with good function   Status post right total knee replacement with good function. Previous postoperative pulmonary emboli resolved. Continue monitoring knee function.  Coronary artery disease with high coronary calcium  score   Coronary artery disease with high coronary calcium  score. LDL target is 60. Current treatment includes Lipitor increased to 80 mg and addition of Zetia . Stress test and echocardiogram show no significant findings. He is hesitant about injectable cholesterol medication due to uncertainty about its necessity given current test results. Order repeat cholesterol test, continue Lipitor 80 mg and Zetia , and consider injectable cholesterol medication if LDL goals are not met.  Hyperlipidemia, on intensified therapy   Hyperlipidemia under intensified therapy with Lipitor and Zetia . Order repeat cholesterol test and continue current lipid-lowering therapy.  First degree AV block   First degree AV block noted. Monitoring in context of possible sarcoidosis, which can cause AV block. Continue monitoring first degree AV block.  History of prostate surgery   Prostate surgery performed by Dr. Treasure. Continue annual urology follow-up.    Yusif Gnau R Lowne Chase, DO 09/28/2023

## 2023-09-28 NOTE — Assessment & Plan Note (Signed)
 Ghm utd Check labs  See AVS Health Maintenance  Topic Date Due   HIV Screening  Never done   COVID-19 Vaccine (3 - Moderna risk series) 10/14/2023 (Originally 04/29/2019)   Zoster Vaccines- Shingrix (1 of 2) 12/29/2023 (Originally 08/28/1979)   INFLUENZA VACCINE  05/03/2024 (Originally 09/04/2023)   DTaP/Tdap/Td (1 - Tdap) 09/27/2024 (Originally 08/28/1979)   Pneumococcal Vaccine: 50+ Years (1 of 2 - PCV) 09/27/2024 (Originally 08/28/1979)   Medicare Annual Wellness (AWV)  09/27/2024   Colonoscopy  09/27/2025   Hepatitis C Screening  Completed   Hepatitis B Vaccines 19-59 Average Risk  Aged Out   HPV VACCINES  Aged Out   Meningococcal B Vaccine  Aged Out

## 2023-09-28 NOTE — Progress Notes (Deleted)
 Subjective:    Patient ID: Francis CHRISTELLA [name], MD, male    DOB: [birth year], 63 y.o.   MRN: [id number]  Chief Complaint  Patient presents with   Hypertension   Follow-up    HPI Patient is in today for ***  Past Medical History:  Diagnosis Date   Arthritis    right knee   BPH (benign prostatic hypertrophy)    GERD (gastroesophageal reflux disease)    H/O hiatal hernia    History of colon polyps    History of kidney stones    surgery to remove   Hyperlipidemia    Hypertension    Knee internal derangement    RIGHT   Macular pucker, right eye 07/05/2019   Vitrectomy, removal of silicone oil, membrane peel, repair complex retinal detachment inferonasal right eye  11-30-19   Mild asthma    Nuclear sclerotic cataract of left eye 03/26/2021   Discussion with patient and family that medical reasons that cataract surgery should be undertaken so as to allow for ultimately complete removal of the anterior hyaloid at time of vitrectomy   Personal history of COVID-19 08/2020   Right inguinal hernia    Vitamin D  deficiency    Wears glasses     Past Surgical History:  Procedure Laterality Date   BRONCHIAL BIOPSY  10/20/2022   Procedure: BRONCHIAL BIOPSIES;  Surgeon: Shelah Lamar RAMAN, MD;  Location: [facility] ENDOSCOPY;  Service: Pulmonary;;   BRONCHIAL BRUSHINGS  10/20/2022   Procedure: BRONCHIAL BRUSHINGS;  Surgeon: Shelah Lamar RAMAN, MD;  Location: [facility] ENDOSCOPY;  Service: Pulmonary;;   BRONCHIAL NEEDLE ASPIRATION BIOPSY  10/20/2022   Procedure: BRONCHIAL NEEDLE ASPIRATION BIOPSIES;  Surgeon: Shelah Lamar RAMAN, MD;  Location: [facility] ENDOSCOPY;  Service: Pulmonary;;   BRONCHIAL WASHINGS  10/20/2022   Procedure: BRONCHIAL WASHINGS;  Surgeon: Shelah Lamar RAMAN, MD;  Location: [facility] ENDOSCOPY;  Service: Pulmonary;;   COLONOSCOPY  09/27/2020   COLONOSCOPY W/ POLYPECTOMY  01/21/2008   ESOPHAGOGASTRODUODENOSCOPY  11/15/2008   EYE SURGERY Bilateral    x6   rankin   HOLEP-LASER ENUCLEATION OF THE PROSTATE WITH  MORCELLATION N/A 05/30/2022   Procedure: HOLEP-LASER ENUCLEATION OF THE PROSTATE WITH MORCELLATION;  Surgeon: Francisca Redell BROCKS, MD;  Location: [facility] ORS;  Service: Urology;  Laterality: N/A;   INGUINAL HERNIA REPAIR Right 08/08/2021   Procedure: OPEN RIGHT INGUINAL HERNIA REPAIR WITH MESH;  Surgeon: Eletha Boas, MD;  Location: [facility] ORS;  Service: General;  Laterality: Right;   KNEE ARTHROSCOPY Right X4  last one 2005   KNEE ARTHROSCOPY Right 12/06/2013   Procedure: ARTHROSCOPY RIGHT KNEE WITH DEBRIDMENT AND PARTIAL MEDIAL AND LATERAL MENISCECTOMY AND CHONDRLPLASTY;  Surgeon: Lamar Collet, MD;  Location: [facility];  Service: Orthopedics;  Laterality: Right;   LUMBAR DISC SURGERY  04/02/2007   L5 -- S1   NEGATIVE SLEEP STUDY  per pt   PARATHYROIDECTOMY  02/04/1987   removal adenoma   PROSTATE SURGERY  05/2022   holap   REPLACEMENT TOTAL KNEE Right 12/2022   alusio    Family History  Problem Relation Age of Onset   Hyperlipidemia Mother    Coronary artery disease Mother    Pulmonary embolism Father    Colon cancer Father    Hyperlipidemia Brother    Pancreatic cancer Brother     Social History   Socioeconomic History   Marital status: Married    Spouse name: jan   Number of children: 2   Years of education: Not on file  Highest education level: Not on file  Occupational History   Occupation: physician  Tobacco Use   Smoking status: Never    Passive exposure: Never   Smokeless tobacco: Never  Vaping Use   Vaping status: Never Used  Substance and Sexual Activity   Alcohol use: Yes    Alcohol/week: 0.0 standard drinks of alcohol    Comment: social use   Drug use: No   Sexual activity: Yes    Partners: Female  Other Topics Concern   Not on file  Social History Narrative   Not on file   Social Drivers of Health   Financial Resource Strain: Not on file  Food Insecurity: Not on file  Transportation Needs: Not on file  Physical Activity: Not on file   Stress: Not on file  Social Connections: Not on file  Intimate Partner Violence: Not on file    Outpatient Medications Prior to Visit  Medication Sig Dispense Refill   aspirin  EC 81 MG tablet Take 1 tablet (81 mg total) by mouth daily. Swallow whole.     atorvastatin  (LIPITOR) 80 MG tablet Take 1 tablet (80 mg total) by mouth every morning. 90 tablet 3   budesonide -formoterol  (SYMBICORT ) 160-4.5 MCG/ACT inhaler Inhale 2 puffs into the lungs daily. 18 g 1   Efinaconazole  (JUBLIA ) 10 % SOLN Apply 1 Application topically daily. 4 mL 11   ezetimibe  (ZETIA ) 10 MG tablet Take 1 tablet (10 mg total) by mouth daily. 90 tablet 3   fluorouracil  (EFUDEX ) 5 % cream Apply to face, scalp and tops of ears bid for 2 wks 40 g 2   fluticasone  (FLONASE ) 50 MCG/ACT nasal spray Place 2 sprays into both nostrils daily as needed for allergies or rhinitis. 16 g 2   ibuprofen (ADVIL) 600 MG tablet Take 600 mg by mouth every 6 (six) hours as needed.     levalbuterol  (XOPENEX ) 0.63 MG/3ML nebulizer solution Take 3 mLs (0.63 mg total) by nebulization every 6 (six) hours as needed for wheezing or shortness of breath. 90 mL 5   VITAMIN D  PO Take 2 capsules by mouth daily.     allopurinol  (ZYLOPRIM ) 300 MG tablet Take 1 tablet (300 mg total) by mouth daily. 90 tablet 0   mometasone  (NASONEX ) 50 MCG/ACT nasal spray Place 2 sprays into the nose daily. 1 each 12   nebivolol  (BYSTOLIC ) 10 MG tablet Take 1 tablet (10 mg total) by mouth at bedtime. 90 tablet 0   olmesartan -hydrochlorothiazide (BENICAR  HCT) 40-12.5 MG tablet TAKE 1 TABLET DAILY 90 tablet 1   omeprazole  (PRILOSEC) 40 MG capsule TAKE 1 CAPSULE DAILY 90 capsule 1   mirabegron  ER (MYRBETRIQ ) 50 MG TB24 tablet Take 1 tablet by mouth daily (Patient not taking: Reported on 09/28/2023) 90 tablet 3   No facility-administered medications prior to visit.    Allergies  Allergen Reactions   Merthiolate [Thimerosal (Thiomersal)] Rash    Rash--local    ROS      Objective:    Physical Exam  BP 108/70 (BP Location: Left Arm, Patient Position: Sitting, Cuff Size: Normal)   Pulse (!) 58   Temp 98.3 F (36.8 C) (Oral)   Resp 18   Ht 6' 2 (1.88 m)   Wt 208 lb (94.3 kg)   SpO2 95%   BMI 26.71 kg/m  Wt Readings from Last 3 Encounters:  09/28/23 208 lb (94.3 kg)  09/10/23 208 lb 3.2 oz (94.4 kg)  02/12/23 205 lb (93 kg)    Diabetic Foot Exam -  Simple   No data filed    Lab Results  Component Value Date   WBC 4.9 12/08/2022   HGB 13.8 12/08/2022   HCT 42.2 12/08/2022   PLT 272.0 12/08/2022   GLUCOSE 92 12/08/2022   CHOL 177 01/16/2023   TRIG 126 01/16/2023   HDL 38 (L) 01/16/2023   LDLDIRECT 129.0 12/18/2021   LDLCALC 116 (H) 01/16/2023   ALT 38 01/16/2023   AST 31 01/16/2023   NA 140 12/08/2022   K 4.2 12/08/2022   CL 101 12/08/2022   CREATININE 0.99 12/08/2022   BUN 18 12/08/2022   CO2 29 12/08/2022   TSH 2.26 12/18/2021   PSA 1.24 09/23/2022   INR 1.0 12/09/2006   HGBA1C 6.1 07/12/2020    Lab Results  Component Value Date   TSH 2.26 12/18/2021   Lab Results  Component Value Date   WBC 4.9 12/08/2022   HGB 13.8 12/08/2022   HCT 42.2 12/08/2022   MCV 92.1 12/08/2022   PLT 272.0 12/08/2022   Lab Results  Component Value Date   NA 140 12/08/2022   K 4.2 12/08/2022   CO2 29 12/08/2022   GLUCOSE 92 12/08/2022   BUN 18 12/08/2022   CREATININE 0.99 12/08/2022   BILITOT 0.4 01/16/2023   ALKPHOS 94 01/16/2023   AST 31 01/16/2023   ALT 38 01/16/2023   PROT 6.8 01/16/2023   ALBUMIN 4.2 01/16/2023   CALCIUM  10.3 12/08/2022   ANIONGAP 8 10/20/2022   GFR 80.64 12/08/2022   Lab Results  Component Value Date   CHOL 177 01/16/2023   Lab Results  Component Value Date   HDL 38 (L) 01/16/2023   Lab Results  Component Value Date   LDLCALC 116 (H) 01/16/2023   Lab Results  Component Value Date   TRIG 126 01/16/2023   Lab Results  Component Value Date   CHOLHDL 4.7 01/16/2023   Lab Results  Component  Value Date   HGBA1C 6.1 07/12/2020       Assessment & Plan:  Preventative health care -     Lipid panel -     TSH -     Comprehensive metabolic panel with GFR -     CBC with Differential/Platelet -     VITAMIN D  25 Hydroxy (Vit-D Deficiency, Fractures)  Essential hypertension -     Olmesartan  Medoxomil-HCTZ; Take 1 tablet by mouth daily.  Dispense: 90 tablet; Refill: 3 -     Nebivolol  HCl; Take 1 tablet (10 mg total) by mouth daily.  Dispense: 90 tablet; Refill: 3 -     TSH -     CBC with Differential/Platelet  Idiopathic chronic gout of foot without tophus, unspecified laterality -     Allopurinol ; Take 1 tablet (300 mg total) by mouth daily.  Dispense: 90 tablet; Refill: 3 -     Uric acid  Gastroesophageal reflux disease, unspecified whether esophagitis present -     Omeprazole ; Take 1 capsule (40 mg total) by mouth daily.  Dispense: 90 capsule; Refill: 3  Vitamin D  deficiency -     VITAMIN D  25 Hydroxy (Vit-D Deficiency, Fractures)  Prostate cancer (HCC) -     PSA  Hyperlipidemia, unspecified hyperlipidemia type -     Lipid panel -     Comprehensive metabolic panel with GFR  Welcome to Medicare preventive visit -     EKG 12-Lead  Other orders -     Mometasone  Furoate; Place 2 sprays into the nose daily.  Dispense: 17  mL; Refill: 3    Jamee JONELLE Antonio Cyndee, DO

## 2023-09-29 ENCOUNTER — Other Ambulatory Visit (INDEPENDENT_AMBULATORY_CARE_PROVIDER_SITE_OTHER)

## 2023-09-29 DIAGNOSIS — Z0000 Encounter for general adult medical examination without abnormal findings: Secondary | ICD-10-CM

## 2023-09-29 DIAGNOSIS — C61 Malignant neoplasm of prostate: Secondary | ICD-10-CM

## 2023-09-29 DIAGNOSIS — J8410 Pulmonary fibrosis, unspecified: Secondary | ICD-10-CM | POA: Diagnosis not present

## 2023-09-29 LAB — COMPREHENSIVE METABOLIC PANEL WITH GFR
ALT: 39 U/L (ref 0–53)
AST: 26 U/L (ref 0–37)
Albumin: 4.4 g/dL (ref 3.5–5.2)
Alkaline Phosphatase: 73 U/L (ref 39–117)
BUN: 21 mg/dL (ref 6–23)
CO2: 31 meq/L (ref 19–32)
Calcium: 9.9 mg/dL (ref 8.4–10.5)
Chloride: 103 meq/L (ref 96–112)
Creatinine, Ser: 1.09 mg/dL (ref 0.40–1.50)
GFR: 71.43 mL/min (ref >60.00)
Glucose, Bld: 92 mg/dL (ref 70–99)
Potassium: 4.3 meq/L (ref 3.5–5.1)
Sodium: 144 meq/L (ref 135–145)
Total Bilirubin: 0.5 mg/dL (ref 0.2–1.2)
Total Protein: 6.9 g/dL (ref 6.0–8.3)

## 2023-09-29 LAB — LIPID PANEL
Cholesterol: 158 mg/dL (ref 0–200)
HDL: 39 mg/dL — ABNORMAL LOW (ref >39.00)
LDL Cholesterol: 84 mg/dL (ref 0–99)
NonHDL: 119.01
Total CHOL/HDL Ratio: 4
Triglycerides: 173 mg/dL — ABNORMAL HIGH (ref 0.0–149.0)
VLDL: 34.6 mg/dL (ref 0.0–40.0)

## 2023-09-29 LAB — CBC WITH DIFFERENTIAL/PLATELET
Basophils Absolute: 0.1 K/uL (ref 0.0–0.1)
Basophils Relative: 1.4 % (ref 0.0–3.0)
Eosinophils Absolute: 0.6 K/uL (ref 0.0–0.7)
Eosinophils Relative: 11.3 % — ABNORMAL HIGH (ref 0.0–5.0)
HCT: 43 % (ref 39.0–52.0)
Hemoglobin: 14.4 g/dL (ref 13.0–17.0)
Lymphocytes Relative: 36.2 % (ref 12.0–46.0)
Lymphs Abs: 2 K/uL (ref 0.7–4.0)
MCHC: 33.5 g/dL (ref 30.0–36.0)
MCV: 90.1 fl (ref 78.0–100.0)
Monocytes Absolute: 0.5 K/uL (ref 0.1–1.0)
Monocytes Relative: 9.2 % (ref 3.0–12.0)
Neutro Abs: 2.3 K/uL (ref 1.4–7.7)
Neutrophils Relative %: 41.9 % — ABNORMAL LOW (ref 43.0–77.0)
Platelets: 251 K/uL (ref 150.0–400.0)
RBC: 4.77 Mil/uL (ref 4.22–5.81)
RDW: 14.3 % (ref 11.5–15.5)
WBC: 5.6 K/uL (ref 4.0–10.5)

## 2023-09-29 LAB — TSH: TSH: 1.74 u[IU]/mL (ref 0.35–5.50)

## 2023-09-29 LAB — PSA: PSA: 1.69 ng/mL (ref 0.10–4.00)

## 2023-09-29 LAB — URIC ACID: Uric Acid, Serum: 5.6 mg/dL (ref 4.0–7.8)

## 2023-09-29 LAB — VITAMIN D 25 HYDROXY (VIT D DEFICIENCY, FRACTURES): VITD: 41.82 ng/mL (ref 30.00–100.00)

## 2023-09-30 NOTE — Telephone Encounter (Signed)
 PSA ordered, lab appt scheduled.

## 2023-10-04 ENCOUNTER — Ambulatory Visit: Payer: Self-pay | Admitting: Family Medicine

## 2023-10-04 DIAGNOSIS — E785 Hyperlipidemia, unspecified: Secondary | ICD-10-CM

## 2023-10-07 ENCOUNTER — Encounter: Payer: Self-pay | Admitting: Urology

## 2023-10-07 ENCOUNTER — Other Ambulatory Visit: Payer: Self-pay

## 2023-10-07 MED ORDER — MOMETASONE FUROATE 50 MCG/ACT NA SUSP: 2.0000 | Freq: Every day | NASAL | 3 refills | Status: AC

## 2023-10-14 ENCOUNTER — Ambulatory Visit: Admitting: Dermatology

## 2023-11-11 NOTE — Progress Notes (Signed)
 HPI: Follow-up coronary calcification. Carotid Doppler September 2020 showed minimal heterogeneous and calcified plaque with no significant stenosis. Calcium  score August 29, 2022 1027 which was 93rd percentile. There was also note of widespread nodularity throughout the lungs bilaterally. High-resolution chest CT September 2024 showed upper and midlung zone predominant peribronchovascular nodularity, nodular consolidation and architectural distortion, small mediastinal lymph nodes suggestive of sarcoid. Patient had bronchoscopy and pathology showed noncaseating granulomas present that was felt to be sarcoid like.  Nuclear study October 2024 showed ejection fraction 60% and no ischemia or infarction.  Echocardiogram November 2024 showed normal LV function.  CTA December 2024 showed bilateral pulmonary emboli.  Lower extremity venous Dopplers January 2025 showed chronic DVT in the left peroneal veins.  CTA July 2025 showed no pulmonary embolus with complete resolution embolus.  Since last seen, he has dyspnea with more vigorous active use but not routine activities.  No orthopnea, PND, pedal edema, chest pain or syncope.  Current Outpatient Medications  Medication Sig Dispense Refill   allopurinol  (ZYLOPRIM ) 300 MG tablet Take 1 tablet (300 mg total) by mouth daily. 90 tablet 3   aspirin  EC 81 MG tablet Take 1 tablet (81 mg total) by mouth daily. Swallow whole.     atorvastatin  (LIPITOR) 80 MG tablet Take 1 tablet (80 mg total) by mouth every morning. 90 tablet 3   budesonide -formoterol  (SYMBICORT ) 160-4.5 MCG/ACT inhaler Inhale 2 puffs into the lungs daily. 18 g 1   ezetimibe  (ZETIA ) 10 MG tablet Take 1 tablet (10 mg total) by mouth daily. 90 tablet 3   fluorouracil  (EFUDEX ) 5 % cream Apply to face, scalp and tops of ears bid for 2 wks 40 g 2   ibuprofen (ADVIL) 600 MG tablet Take 600 mg by mouth every 6 (six) hours as needed.     levalbuterol  (XOPENEX ) 0.63 MG/3ML nebulizer solution Take 3 mLs  (0.63 mg total) by nebulization every 6 (six) hours as needed for wheezing or shortness of breath. 90 mL 5   mirabegron  ER (MYRBETRIQ ) 50 MG TB24 tablet Take 1 tablet by mouth daily 90 tablet 3   mometasone  (NASONEX ) 50 MCG/ACT nasal spray Place 2 sprays into the nose daily. 3 each 3   nebivolol  (BYSTOLIC ) 10 MG tablet Take 1 tablet (10 mg total) by mouth daily. 90 tablet 3   olmesartan -hydrochlorothiazide (BENICAR  HCT) 40-12.5 MG tablet Take 1 tablet by mouth daily. 90 tablet 3   omeprazole  (PRILOSEC) 40 MG capsule Take 1 capsule (40 mg total) by mouth daily. 90 capsule 3   VITAMIN D  PO Take 2 capsules by mouth daily.     No current facility-administered medications for this visit.     Past Medical History:  Diagnosis Date   Arthritis    right knee   BPH (benign prostatic hypertrophy)    GERD (gastroesophageal reflux disease)    H/O hiatal hernia    History of colon polyps    History of kidney stones    surgery to remove   Hyperlipidemia    Hypertension    Knee internal derangement    RIGHT   Macular pucker, right eye 07/05/2019   Vitrectomy, removal of silicone oil, membrane peel, repair complex retinal detachment inferonasal right eye  11-30-19   Mild asthma    Nuclear sclerotic cataract of left eye 03/26/2021   Discussion with patient and family that medical reasons that cataract surgery should be undertaken so as to allow for ultimately complete removal of the anterior hyaloid at time  of vitrectomy   Personal history of COVID-19 08/2020   Right inguinal hernia    Vitamin D  deficiency    Wears glasses     Past Surgical History:  Procedure Laterality Date   BRONCHIAL BIOPSY  10/20/2022   Procedure: BRONCHIAL BIOPSIES;  Surgeon: Shelah Lamar RAMAN, MD;  Location: [facility] ENDOSCOPY;  Service: Pulmonary;;   BRONCHIAL BRUSHINGS  10/20/2022   Procedure: BRONCHIAL BRUSHINGS;  Surgeon: Shelah Lamar RAMAN, MD;  Location: [facility] ENDOSCOPY;  Service: Pulmonary;;   BRONCHIAL NEEDLE ASPIRATION  BIOPSY  10/20/2022   Procedure: BRONCHIAL NEEDLE ASPIRATION BIOPSIES;  Surgeon: Shelah Lamar RAMAN, MD;  Location: [facility] ENDOSCOPY;  Service: Pulmonary;;   BRONCHIAL WASHINGS  10/20/2022   Procedure: BRONCHIAL WASHINGS;  Surgeon: Shelah Lamar RAMAN, MD;  Location: [facility] ENDOSCOPY;  Service: Pulmonary;;   COLONOSCOPY  09/27/2020   COLONOSCOPY W/ POLYPECTOMY  01/21/2008   ESOPHAGOGASTRODUODENOSCOPY  11/15/2008   EYE SURGERY Bilateral    x6   rankin   HOLEP-LASER ENUCLEATION OF THE PROSTATE WITH MORCELLATION N/A 05/30/2022   Procedure: HOLEP-LASER ENUCLEATION OF THE PROSTATE WITH MORCELLATION;  Surgeon: Francisca Redell BROCKS, MD;  Location: [facility] ORS;  Service: Urology;  Laterality: N/A;   INGUINAL HERNIA REPAIR Right 08/08/2021   Procedure: OPEN RIGHT INGUINAL HERNIA REPAIR WITH MESH;  Surgeon: Eletha Boas, MD;  Location: [facility] ORS;  Service: General;  Laterality: Right;   KNEE ARTHROSCOPY Right X4  last one 2005   KNEE ARTHROSCOPY Right 12/06/2013   Procedure: ARTHROSCOPY RIGHT KNEE WITH DEBRIDMENT AND PARTIAL MEDIAL AND LATERAL MENISCECTOMY AND CHONDRLPLASTY;  Surgeon: Lamar Collet, MD;  Location: [facility];  Service: Orthopedics;  Laterality: Right;   LUMBAR DISC SURGERY  04/02/2007   L5 -- S1   NEGATIVE SLEEP STUDY  per pt   PARATHYROIDECTOMY  02/04/1987   removal adenoma   PROSTATE SURGERY  05/2022   holap   REPLACEMENT TOTAL KNEE Right 12/2022   alusio    Social History   Socioeconomic History   Marital status: Married    Spouse name: jan   Number of children: 2   Years of education: Not on file   Highest education level: Not on file  Occupational History   Occupation: physician  Tobacco Use   Smoking status: Never    Passive exposure: Never   Smokeless tobacco: Never  Vaping Use   Vaping status: Never Used  Substance and Sexual Activity   Alcohol use: Yes    Alcohol/week: 0.0 standard drinks of alcohol    Comment: social use   Drug use: No   Sexual activity: Yes     Partners: Female  Other Topics Concern   Not on file  Social History Narrative   Not on file   Social Drivers of Health   Financial Resource Strain: Not on file  Food Insecurity: Not on file  Transportation Needs: Not on file  Physical Activity: Not on file  Stress: Not on file  Social Connections: Not on file  Intimate Partner Violence: Not on file    Family History  Problem Relation Age of Onset   Hyperlipidemia Mother    Coronary artery disease Mother    Pulmonary embolism Father    Colon cancer Father    Hyperlipidemia Brother    Pancreatic cancer Brother     ROS: no fevers or chills, productive cough, hemoptysis, dysphasia, odynophagia, melena, hematochezia, dysuria, hematuria, rash, seizure activity, orthopnea, PND, pedal edema, claudication. Remaining systems are negative.  Physical Exam: Well-developed well-nourished in no  acute distress.  Skin is warm and dry.  HEENT is normal.  Neck is supple.  Chest is clear to auscultation with normal expansion.  Cardiovascular exam is regular rate and rhythm.  Abdominal exam nontender or distended. No masses palpated. Extremities show no edema. neuro grossly intact  EKG Interpretation Date/Time:  Wednesday November 18 2023 10:12:18 EDT Ventricular Rate:  57 PR Interval:  238 QRS Duration:  92 QT Interval:  446 QTC Calculation: 434 R Axis:   -36  Text Interpretation: Sinus bradycardia with 1st degree A-V block Left axis deviation Confirmed by Pietro Rogue ([id number]) on 11/18/2023 10:20:12 AM    A/P  1 coronary calcification-patient denies chest pain.  Previous nuclear study showed no ischemia.  Continue medical therapy with aspirin  and statin.  2 hyperlipidemia-continue Lipitor and Zetia .  Recent LDL not at goal.  LP(a) also elevated previously.  Will refer to lipid clinic for PCSK9 inhibitor.  3 hypertension-patient's blood pressure is controlled.  Continue present medical regimen.  4 prior pulmonary  embolus-occurred following knee replacement.  5 history of sarcoid-per pulmonary.  Rogue Pietro, MD

## 2023-11-13 ENCOUNTER — Ambulatory Visit (HOSPITAL_BASED_OUTPATIENT_CLINIC_OR_DEPARTMENT_OTHER)

## 2023-11-13 DIAGNOSIS — J8410 Pulmonary fibrosis, unspecified: Secondary | ICD-10-CM | POA: Diagnosis not present

## 2023-11-13 LAB — PULMONARY FUNCTION TEST
DL/VA % pred: 99 %
DL/VA: 4.07 ml/min/mmHg/L
DLCO cor % pred: 88 %
DLCO cor: 25.98 ml/min/mmHg
DLCO unc % pred: 88 %
DLCO unc: 25.83 ml/min/mmHg
FEF 25-75 Post: 2.5 L/s
FEF 25-75 Pre: 1.42 L/s
FEF2575-%Change-Post: 76 %
FEF2575-%Pred-Post: 83 %
FEF2575-%Pred-Pre: 47 %
FEV1-%Change-Post: 17 %
FEV1-%Pred-Post: 79 %
FEV1-%Pred-Pre: 67 %
FEV1-Post: 3.03 L
FEV1-Pre: 2.58 L
FEV1FVC-%Change-Post: 7 %
FEV1FVC-%Pred-Pre: 88 %
FEV6-%Change-Post: 10 %
FEV6-%Pred-Post: 86 %
FEV6-%Pred-Pre: 78 %
FEV6-Post: 4.24 L
FEV6-Pre: 3.84 L
FEV6FVC-%Change-Post: 0 %
FEV6FVC-%Pred-Post: 104 %
FEV6FVC-%Pred-Pre: 103 %
FVC-%Change-Post: 9 %
FVC-%Pred-Post: 83 %
FVC-%Pred-Pre: 75 %
FVC-Post: 4.25 L
FVC-Pre: 3.89 L
Post FEV1/FVC ratio: 71 %
Post FEV6/FVC ratio: 100 %
Pre FEV1/FVC ratio: 66 %
Pre FEV6/FVC Ratio: 99 %
RV % pred: 147 %
RV: 3.7 L
TLC % pred: 104 %
TLC: 7.96 L

## 2023-11-13 NOTE — Patient Instructions (Signed)
 Full PFT performed today.

## 2023-11-13 NOTE — Progress Notes (Signed)
 Full PFT performed today.

## 2023-11-18 ENCOUNTER — Ambulatory Visit: Attending: Cardiovascular Disease | Admitting: Cardiology

## 2023-11-18 ENCOUNTER — Encounter: Payer: Self-pay | Admitting: Cardiology

## 2023-11-18 VITALS — BP 130/60 | HR 57 | Ht 74.0 in | Wt 205.0 lb

## 2023-11-18 DIAGNOSIS — R0609 Other forms of dyspnea: Secondary | ICD-10-CM

## 2023-11-18 DIAGNOSIS — I2510 Atherosclerotic heart disease of native coronary artery without angina pectoris: Secondary | ICD-10-CM

## 2023-11-18 DIAGNOSIS — E785 Hyperlipidemia, unspecified: Secondary | ICD-10-CM | POA: Diagnosis not present

## 2023-11-18 NOTE — Patient Instructions (Signed)

## 2023-11-25 ENCOUNTER — Ambulatory Visit (INDEPENDENT_AMBULATORY_CARE_PROVIDER_SITE_OTHER): Admitting: Dermatology

## 2023-11-25 ENCOUNTER — Encounter: Payer: Self-pay | Admitting: Dermatology

## 2023-11-25 VITALS — BP 136/85 | HR 74

## 2023-11-25 DIAGNOSIS — L578 Other skin changes due to chronic exposure to nonionizing radiation: Secondary | ICD-10-CM | POA: Diagnosis not present

## 2023-11-25 DIAGNOSIS — B351 Tinea unguium: Secondary | ICD-10-CM

## 2023-11-25 DIAGNOSIS — Z1283 Encounter for screening for malignant neoplasm of skin: Secondary | ICD-10-CM | POA: Diagnosis not present

## 2023-11-25 DIAGNOSIS — D225 Melanocytic nevi of trunk: Secondary | ICD-10-CM

## 2023-11-25 DIAGNOSIS — D239 Other benign neoplasm of skin, unspecified: Secondary | ICD-10-CM

## 2023-11-25 DIAGNOSIS — L570 Actinic keratosis: Secondary | ICD-10-CM

## 2023-11-25 DIAGNOSIS — D2371 Other benign neoplasm of skin of right lower limb, including hip: Secondary | ICD-10-CM

## 2023-11-25 DIAGNOSIS — D229 Melanocytic nevi, unspecified: Secondary | ICD-10-CM

## 2023-11-25 DIAGNOSIS — D485 Neoplasm of uncertain behavior of skin: Secondary | ICD-10-CM | POA: Diagnosis not present

## 2023-11-25 DIAGNOSIS — L821 Other seborrheic keratosis: Secondary | ICD-10-CM

## 2023-11-25 DIAGNOSIS — L814 Other melanin hyperpigmentation: Secondary | ICD-10-CM

## 2023-11-25 DIAGNOSIS — D1801 Hemangioma of skin and subcutaneous tissue: Secondary | ICD-10-CM

## 2023-11-25 DIAGNOSIS — W908XXA Exposure to other nonionizing radiation, initial encounter: Secondary | ICD-10-CM | POA: Diagnosis not present

## 2023-11-25 NOTE — Patient Instructions (Addendum)

## 2023-11-25 NOTE — Progress Notes (Unsigned)
 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 The patient has spots, moles and lesions to be evaluated, some may be new or changing.  Patient is concerned about the scaly spots on the top of his head.  There is a scaly spot on his right cheek and has been there for 6 months.  There is a scaly spot on his right hand that has been there for 1 year and is getting bigger.   Patient states that he uses SPF 50 when he is outside, and has SPF clothing and a wide brim hat.  He confirms that he has had sunburns on his scalp when he was younger.   During the patients skin exam last year he was prescribed Efudex  to use on his face, scalp, and tops of his ears. He states that he has not done this treatment because he was not sure the amount to use.    The following portions of the chart were reviewed this encounter and updated as appropriate: medications, allergies, medical history  Review of Systems:  No other skin or systemic complaints except as noted in HPI or Assessment and Plan.  Objective  Well appearing patient in no apparent distress; mood and affect are within normal limits.  A full examination was performed including scalp, head, eyes, ears, nose, lips, neck, chest, axillae, abdomen, back, buttocks, bilateral upper extremities, bilateral lower extremities, hands, feet, fingers, toes, fingernails, and toenails. All findings within normal limits unless otherwise noted below.   Relevant physical exam findings are noted in the Assessment and Plan.  Right Dorsal Hand Erythematous thin papules/macules with gritty scale.  Left Breast 7 mm irregularly shaped brown macule    Assessment & Plan   SKIN CANCER SCREENING PERFORMED TODAY.  ACTINIC DAMAGE - Chronic condition, secondary to cumulative  UV/sun exposure - diffuse scaly erythematous macules with underlying dyspigmentation - Recommend daily broad spectrum sunscreen SPF 30+ to sun-exposed areas, reapply every 2 hours as needed.  - Staying in the shade or wearing long sleeves, sun glasses (UVA+UVB protection) and wide brim hats (4-inch brim around the entire circumference of the hat) are also recommended for sun protection.  - Call for new or changing lesions.  LENTIGINES, SEBORRHEIC KERATOSES, HEMANGIOMAS - Benign normal skin lesions - Benign-appearing - Call for any changes  MELANOCYTIC NEVI - Tan-brown and/or pink-flesh-colored symmetric macules and papules - Benign appearing on exam today - Observation - Call clinic for new or changing moles - Recommend daily use of broad spectrum spf 30+ sunscreen to sun-exposed areas.   ACTINIC KERATOSIS Exam: Erythematous thin papules/macules with gritty scale at the face, scalp and ears.  Actinic keratoses are precancerous spots that appear secondary to cumulative UV radiation exposure/sun exposure over time. They are chronic with expected duration over 1 year. A portion of actinic keratoses will progress to squamous cell carcinoma of the skin. It is not possible to reliably predict which spots will progress to skin cancer and so treatment is recommended to prevent development of skin cancer.  Recommend daily broad spectrum sunscreen SPF 30+ to sun-exposed areas, reapply every 2 hours as needed.  Recommend staying in the shade or wearing long sleeves, sun glasses (UVA+UVB protection) and wide brim hats (4-inch brim around the entire circumference of the hat). Call for new  or changing lesions.  Treatment Plan: Start 5-fluorouracil  cream twice a day for 14 days to affected areas including the face, scalp, and ears.  Reviewed course of treatment and expected reaction.  Patient advised to expect inflammation and crusting and advised that erosions are possible.  Patient advised to be  diligent with sun protection during and after treatment. Handout with details of how to apply medication and what to expect provided. Counseled to keep medication out of reach of children and pets.  Reviewed course of treatment and expected reaction.  Patient advised to expect inflammation and crusting and advised that erosions are possible.  Patient advised to be diligent with sun protection during and after treatment. Handout with details of how to apply medication and what to expect provided. Counseled to keep medication out of reach of children and pets.   Discussed treating the scalp first and then treating the face and ears.  Patient was prescribed Efudex  last year during his appointment and still has the prescription.   DERMATOFIBROMA Exam: Firm pink/brown papulenodule with dimple sign on the right lower leg.   Treatment Plan: A dermatofibroma is a benign growth possibly related to trauma, such as an insect bite, cut from shaving, or inflamed acne-type bump.  Treatment options to remove include shave or excision with resulting scar and risk of recurrence.  Since benign-appearing and not bothersome, will observe for now.    ONYCHOMYCOSIS Exam: Thickened toenails with subungal debris c/w onychomycosis   Chronic and persistent condition with duration or expected duration over one year. Condition is symptomatic/ bothersome to patient. Not currently at goal.   Managed by Dr. Alm SPRAIN (ACTINIC KERATOSIS) Right Dorsal Hand Destruction of lesion - Right Dorsal Hand Complexity: simple   Destruction method: cryotherapy   Informed consent: discussed and consent obtained   Timeout:  patient name, date of birth, surgical site, and procedure verified Lesion destroyed using liquid nitrogen: Yes   Region frozen until ice ball extended beyond lesion: Yes   Outcome: patient tolerated procedure well with no complications   Post-procedure details: wound care instructions given    NEOPLASM OF  UNCERTAIN BEHAVIOR OF SKIN Left Breast Skin / nail biopsy Type of biopsy: tangential   Informed consent: discussed and consent obtained   Timeout: patient name, date of birth, surgical site, and procedure verified   Procedure prep:  Patient was prepped and draped in usual sterile fashion Prep type:  Isopropyl alcohol Anesthesia: the lesion was anesthetized in a standard fashion   Anesthetic:  1% lidocaine  w/ epinephrine 1-100,000 buffered w/ 8.4% NaHCO3 Instrument used: DermaBlade   Hemostasis achieved with: aluminum chloride   Outcome: patient tolerated procedure well   Post-procedure details: sterile dressing applied and wound care instructions given   Dressing type: petrolatum and bandage    Specimen 1 - Surgical pathology Differential Diagnosis: r/o NMSC vs other  Check Margins: No Return in about 5 months (around 04/11/2024) for efudex  follow up.  LILLETTE Rollene Gobble, RN, am acting as scribe for RUFUS CHRISTELLA HOLY, MD .   Documentation: I have reviewed the above documentation for accuracy and completeness, and I agree with the above.  RUFUS CHRISTELLA HOLY, MD

## 2023-11-27 LAB — SURGICAL PATHOLOGY

## 2023-11-30 ENCOUNTER — Ambulatory Visit: Payer: Self-pay | Admitting: Dermatology

## 2023-12-09 ENCOUNTER — Ambulatory Visit: Attending: Cardiology

## 2023-12-09 ENCOUNTER — Telehealth: Payer: Self-pay

## 2023-12-09 DIAGNOSIS — E785 Hyperlipidemia, unspecified: Secondary | ICD-10-CM

## 2023-12-09 NOTE — Progress Notes (Unsigned)
 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 Pietro. PMH is significant for HLD, CAD, calcium  score 1027 - 93rd percentile (08/2022), hx of PE, HTN, GERD, BPH, and asthma.  Patient presents to PharmD clinic in good spirits. Patient reports that he takes atorvastatin  80 mg daily for HLD management. He was taking ezetimibe  but stopped a month ago after experiencing some GI side effects. He had been on ezetimibe  ~ 10 months prior to stopping and didn't experience any of the side effects initially but reports that his GI issues have somewhat improved after stopping the ezetimibe .   Reviewed options for lowering LDL cholesterol, including ezetimibe , PCSK-9 inhibitors, bempedoic acid and inclisiran.  Discussed mechanisms of action, dosing, side effects and potential decreases in LDL cholesterol.  Also reviewed cost information and potential options for patient assistance.   Stopped the ezetimibe  10 mg daily - a month ago so most recent lipid panel is with zetia  on board   Current Medications: atorvastatin  80 mg daily, ezetimibe  10 mg daily Intolerances: none Risk Factors: calcium  score 1027 - 93rd percentile (08/2022), Lpa 225.8, hx of PE  LDL goal: < 55; Trig goal < 150 Lipid panel (09/2023): Chol 158, Trig 173, HDL 39, LDL 84 Lpa (01/2023): 225.8   Diet: chicken, fish, pork (hamburger or steak), ground turkey, stopped charcuterie, low greek  Grilled or baked,  Breakfast: fruit with, coffee Lunch/Dinner: Snacks: not much, almonds and cashews,  hardly ever eat chips Beverages: water ,  hardly ever drink soft drink    Family History:  Relation Problem Comments  Mother (Deceased at age 1) Coronary artery disease   Hyperlipidemia     Father (Deceased at age 7) Colon cancer   Pulmonary embolism     Brother (Deceased) Hyperlipidemia   Pancreatic cancer        Social History:  Alcohol: 1 alcoholic beverage three times per week Smoking: none   Labs:  Lipid Panel     Component Value Date/Time   CHOL 158 09/29/2023 0807   CHOL 177 01/16/2023 0812   TRIG 173.0 (H) 09/29/2023 0807   HDL 39.00 (L) 09/29/2023 0807   HDL 38 (L) 01/16/2023 0812   CHOLHDL 4 09/29/2023 0807   VLDL 34.6 09/29/2023 0807   LDLCALC 84 09/29/2023 0807   LDLCALC 116 (H) 01/16/2023 0812   LDLDIRECT 129.0 12/18/2021 0751   LABVLDL 23 01/16/2023 0812    Past Medical History:  Diagnosis Date   Arthritis    right knee   BPH (benign prostatic hypertrophy)    GERD (gastroesophageal reflux disease)    H/O hiatal hernia    History of colon polyps    History of kidney stones    surgery to remove   Hyperlipidemia    Hypertension    Knee internal derangement    RIGHT   Macular pucker, right eye 07/05/2019   Vitrectomy, removal of silicone oil, membrane peel, repair complex retinal detachment inferonasal right eye  11-30-19   Mild asthma    Nuclear sclerotic cataract of left eye 03/26/2021   Discussion with patient and family that medical reasons that cataract surgery should be undertaken so as to allow for ultimately  complete removal of the anterior hyaloid at time of vitrectomy   Personal history of COVID-19 08/2020   Right inguinal hernia    Vitamin D  deficiency    Wears glasses     Current Outpatient Medications on File Prior to Visit  Medication Sig Dispense Refill   allopurinol  (ZYLOPRIM ) 300 MG tablet Take 1 tablet (300 mg total) by mouth daily. 90 tablet 3   aspirin  EC 81 MG tablet Take 1 tablet (81 mg total) by mouth daily. Swallow whole.     atorvastatin  (LIPITOR) 80 MG tablet Take 1 tablet (80 mg total) by mouth every morning. 90 tablet 3   budesonide -formoterol  (SYMBICORT ) 160-4.5 MCG/ACT inhaler Inhale 2 puffs into the lungs daily. 18 g 1   ezetimibe  (ZETIA ) 10 MG tablet Take 1 tablet (10 mg total) by mouth daily. 90 tablet 3   fluorouracil   (EFUDEX ) 5 % cream Apply to face, scalp and tops of ears bid for 2 wks (Patient not taking: Reported on 11/25/2023) 40 g 2   ibuprofen (ADVIL) 600 MG tablet Take 600 mg by mouth every 6 (six) hours as needed.     levalbuterol  (XOPENEX ) 0.63 MG/3ML nebulizer solution Take 3 mLs (0.63 mg total) by nebulization every 6 (six) hours as needed for wheezing or shortness of breath. 90 mL 5   mirabegron  ER (MYRBETRIQ ) 50 MG TB24 tablet Take 1 tablet by mouth daily 90 tablet 3   mometasone  (NASONEX ) 50 MCG/ACT nasal spray Place 2 sprays into the nose daily. 3 each 3   nebivolol  (BYSTOLIC ) 10 MG tablet Take 1 tablet (10 mg total) by mouth daily. 90 tablet 3   olmesartan -hydrochlorothiazide (BENICAR  HCT) 40-12.5 MG tablet Take 1 tablet by mouth daily. 90 tablet 3   omeprazole  (PRILOSEC) 40 MG capsule Take 1 capsule (40 mg total) by mouth daily. (Patient taking differently: Take 40 mg by mouth as needed.) 90 capsule 3   VITAMIN D  PO Take 2 capsules by mouth daily.     No current facility-administered medications on file prior to visit.    Allergies  Allergen Reactions   Merthiolate [Thimerosal (Thiomersal)] Rash    Rash--local    Assessment/Plan:  1. Hyperlipidemia -  No problems updated. No problem-specific Assessment & Plan notes found for this encounter.    Thank you,  Maxen Rowland E. Zendaya Groseclose, Pharm.D [place] [facility]. [facility] [address], [place], [place] [address] Phone: [phone]; Fax: [phone]

## 2023-12-09 NOTE — Patient Instructions (Addendum)
 Your Results:             Your most recent labs Goal  Total Cholesterol 158 < 200  Triglycerides 173 < 150  HDL (happy/good cholesterol) 39 > 40  LDL (lousy/bad cholesterol 84 < 55   Medication changes: Continue atorvastatin  80 mg daily We will start the process to get PCSK9i covered by your insurance.  Once the prior authorization is complete, we will call you to let you know and confirm pharmacy information.    We want to repeat labs 3 months after PCSK9i initiation.   Praluent is a cholesterol medication that improved your body's ability to get rid of bad cholesterol known as LDL. It can lower your LDL up to 60%. It is an injection that is given under the skin every 2 weeks. The most common side effects of Praluent include runny nose, symptoms of the common cold, rarely flu or flu-like symptoms, back/muscle pain in about 3-4% of the patients, and redness, pain, or bruising at the injection site.    Repatha is a cholesterol medication that improved your body's ability to get rid of bad cholesterol known as LDL. It can lower your LDL up to 60%! It is an injection that is given under the skin every 2 weeks. The medication often requires a prior authorization from your insurance company. We will take care of submitting all the necessary information to your insurance company to get it approved. The most common side effects of Repatha include runny nose, symptoms of the common cold, rarely flu or flu-like symptoms, back/muscle pain in about 3-4% of the patients, and redness, pain, or bruising at the injection site.

## 2023-12-10 ENCOUNTER — Telehealth: Payer: Self-pay | Admitting: Pharmacy Technician

## 2023-12-10 ENCOUNTER — Other Ambulatory Visit (HOSPITAL_COMMUNITY): Payer: Self-pay

## 2023-12-10 DIAGNOSIS — E785 Hyperlipidemia, unspecified: Secondary | ICD-10-CM

## 2023-12-10 NOTE — Assessment & Plan Note (Addendum)
 Assessment:  LDL goal: < 55 mg/dl;last LDLc  84  mg/dl (02/7972) Tolerates atorvastatin  well without any side effects  Intolerance to Zetia  (GI side effects) Discussed next potential options (PCSK-9 inhibitors, bempedoic acid and inclisiran); cost, dosing efficacy, side effects  Discussed the variability in lipoprotein(a) levels following initiation of PCSK9 inhibitor therapy Emphasized healthy diet and exercise  Plan: Continue taking atorvastatin  80 mg daily Will apply for PA for PCSK9i; will inform patient upon approval  Lipid lab and Lpa due in 3 months after starting PCSK9i

## 2023-12-10 NOTE — Telephone Encounter (Signed)
   Pharmacy Patient Advocate Encounter   Received notification from Pt Calls Messages that prior authorization for REPATHA is required/requested.   Insurance verification completed.   The patient is insured through [facility].   Per test claim: PA required; PA submitted to above mentioned insurance via Latent Key/confirmation #/EOC [id number] Status is pending

## 2023-12-10 NOTE — Telephone Encounter (Signed)
 Pharmacy Patient Advocate Encounter  Received notification from [facility] that Prior Authorization for repatha has been APPROVED from 11/10/23 to 12/09/24. Ran test claim, Copay is $24.99. This test claim was processed through [facility]- copay amounts may vary at other pharmacies due to pharmacy/plan contracts, or as the patient moves through the different stages of their insurance plan.   PA #/Case ID/Reference #: [id number]

## 2023-12-11 NOTE — Telephone Encounter (Signed)
 Called patient to discuss Repatha copay cost and confirm pharmacy to send prescription. NR - LVM requesting call back.

## 2023-12-15 MED ORDER — REPATHA SURECLICK 140 MG/ML ~~LOC~~ SOAJ: 140.0000 mg | SUBCUTANEOUS | 0 refills | Status: DC

## 2023-12-15 NOTE — Telephone Encounter (Addendum)
 Spoke with patient and discussed Repatha copay. [facility] (90d supply[facility] Reminded patient to obtain follow up Lpa and lipid panel 3 months after starting Repatha. Labs ordered.

## 2023-12-15 NOTE — Addendum Note (Signed)
 Addended by: Aftan Vint E on: 12/15/2023 09:06 AM   Modules accepted: Orders

## 2023-12-15 NOTE — Telephone Encounter (Signed)
 Please see other encounter.

## 2023-12-24 ENCOUNTER — Other Ambulatory Visit: Payer: Self-pay | Admitting: Family Medicine

## 2023-12-24 ENCOUNTER — Other Ambulatory Visit: Payer: Self-pay | Admitting: Cardiology

## 2023-12-24 DIAGNOSIS — E785 Hyperlipidemia, unspecified: Secondary | ICD-10-CM

## 2023-12-24 DIAGNOSIS — I10 Essential (primary) hypertension: Secondary | ICD-10-CM

## 2023-12-24 DIAGNOSIS — M1A0790 Idiopathic chronic gout, unspecified ankle and foot, without tophus (tophi): Secondary | ICD-10-CM

## 2023-12-24 MED ORDER — REPATHA SURECLICK 140 MG/ML ~~LOC~~ SOAJ: 140.0000 mg | SUBCUTANEOUS | 3 refills | Status: AC

## 2023-12-24 NOTE — Addendum Note (Signed)
 Addended by: Anne Sebring E on: 12/24/2023 04:13 PM   Modules accepted: Orders

## 2023-12-28 ENCOUNTER — Other Ambulatory Visit: Payer: Self-pay | Admitting: Cardiology

## 2023-12-28 DIAGNOSIS — E785 Hyperlipidemia, unspecified: Secondary | ICD-10-CM

## 2023-12-28 MED ORDER — ATORVASTATIN CALCIUM 80 MG PO TABS: 80.0000 mg | ORAL_TABLET | Freq: Every morning | ORAL | 3 refills | Status: AC

## 2023-12-30 ENCOUNTER — Ambulatory Visit (HOSPITAL_COMMUNITY)
Admission: RE | Admit: 2023-12-30 | Discharge: 2023-12-30 | Disposition: A | Discharge status: Home / Self Care | Source: Ambulatory Visit | Attending: Cardiology | Admitting: Cardiology

## 2023-12-30 ENCOUNTER — Ambulatory Visit: Payer: Self-pay | Admitting: Cardiology

## 2023-12-30 DIAGNOSIS — R0609 Other forms of dyspnea: Secondary | ICD-10-CM | POA: Insufficient documentation

## 2023-12-30 LAB — ECHOCARDIOGRAM COMPLETE
Area-P 1/2: 3.15 cm2
S' Lateral: 3.25 cm

## 2024-01-12 ENCOUNTER — Encounter: Payer: Self-pay | Admitting: Cardiology

## 2024-01-13 ENCOUNTER — Encounter: Payer: Self-pay | Admitting: Dermatology

## 2024-01-13 ENCOUNTER — Ambulatory Visit: Admitting: Dermatology

## 2024-01-13 VITALS — BP 118/78

## 2024-01-13 DIAGNOSIS — B351 Tinea unguium: Secondary | ICD-10-CM

## 2024-01-13 MED ORDER — TERBINAFINE HCL 250 MG PO TABS: 250.0000 mg | ORAL_TABLET | Freq: Every day | ORAL | 0 refills | Status: AC

## 2024-01-13 NOTE — Patient Instructions (Addendum)

## 2024-01-13 NOTE — Progress Notes (Signed)
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  Patient was last evaluated on 07/08/23.   Onychomycosis: Pt has been applying Jublia  daily about 5 times a week. When he was applying 7 days a week he noticed that the Jublia  was irritating the skin around the nail. He also stated that he has been taking the Vital Protein Collagen but does not see a difference in nail appearance or growth. He stated he seem much more improvement when he was on oral terbinafine .    The following portions of the chart were reviewed this encounter and updated as appropriate: medications, allergies, medical history  Review of Systems:  No other skin or systemic complaints except as noted in HPI or Assessment and Plan.  Objective  Well appearing patient in no apparent distress; mood and affect are within normal limits.   A focused examination was performed of the following areas: B/L feet   Relevant exam findings are noted in the Assessment and Plan.         Assessment & Plan   ONYCHOMYCOSIS Exam: Thickened toenails with subungal debris c/w onychomycosis  Chronic onychomycosis with slow toenail growth. Previous treatment with Jublia  caused skin irritation. Normal liver function tests in August. Recurrence rate is high.  - Prescribed terbinafine  250 mg once daily with dinner for three months. - Instructed to pause Jublia  during terbinafine  treatment. - Recommended Elon nail conditioner, apply twice a week. - Advised to resume Jublia  twice a week after completing terbinafine  course. - Instructed on proper application technique for Jublia  to minimize skin irritation. - Advised to spray antifungal powder in shoes regularly. - Will schedule follow-up in six months. - Will check liver function tests in  February.   Return in 6 months (on 07/13/2024) for ONYCHOMYCOSIS.   Documentation: I have reviewed the above documentation for accuracy and completeness, and I agree with the above.  I, Shirron Maranda, CMA II, am acting as scribe for:  Delon Lenis, DO

## 2024-04-25 ENCOUNTER — Ambulatory Visit: Admitting: Dermatology

## 2024-04-26 ENCOUNTER — Ambulatory Visit: Admitting: Dermatology

## 2024-05-13 ENCOUNTER — Other Ambulatory Visit

## 2024-05-18 ENCOUNTER — Ambulatory Visit: Admitting: Urology

## 2024-05-19 ENCOUNTER — Ambulatory Visit: Admitting: Urology
